# Patient Record
Sex: Male | Born: 1945 | Race: Black or African American | Hispanic: No | Smoking: Former smoker
Health system: Southern US, Community
[De-identification: ages and names within clinical notes are randomized; demographics above are authoritative.]

## PROBLEM LIST (undated history)

## (undated) DIAGNOSIS — M4802 Spinal stenosis, cervical region: Secondary | ICD-10-CM

## (undated) DIAGNOSIS — I10 Essential (primary) hypertension: Secondary | ICD-10-CM

## (undated) DIAGNOSIS — Z66 Do not resuscitate: Secondary | ICD-10-CM

## (undated) DIAGNOSIS — N1831 Chronic kidney disease, stage 3a: Secondary | ICD-10-CM

## (undated) DIAGNOSIS — E119 Type 2 diabetes mellitus without complications: Secondary | ICD-10-CM

## (undated) DIAGNOSIS — E785 Hyperlipidemia, unspecified: Secondary | ICD-10-CM

## (undated) HISTORY — PX: EYE SURGERY: SHX253

## (undated) HISTORY — PX: ANTERIOR CERVICAL DECOMP/DISCECTOMY FUSION: SHX1161

---

## 2015-08-30 ENCOUNTER — Emergency Department (HOSPITAL_BASED_OUTPATIENT_CLINIC_OR_DEPARTMENT_OTHER)
Admission: EM | Admit: 2015-08-30 | Discharge: 2015-08-30 | Disposition: A | Discharge status: Home / Self Care | Payer: Medicare Other | Attending: Emergency Medicine | Admitting: Emergency Medicine

## 2015-08-30 ENCOUNTER — Encounter (HOSPITAL_BASED_OUTPATIENT_CLINIC_OR_DEPARTMENT_OTHER): Payer: Self-pay | Admitting: *Deleted

## 2015-08-30 DIAGNOSIS — Z87891 Personal history of nicotine dependence: Secondary | ICD-10-CM | POA: Diagnosis not present

## 2015-08-30 DIAGNOSIS — R200 Anesthesia of skin: Secondary | ICD-10-CM

## 2015-08-30 DIAGNOSIS — Z79899 Other long term (current) drug therapy: Secondary | ICD-10-CM | POA: Diagnosis not present

## 2015-08-30 DIAGNOSIS — I10 Essential (primary) hypertension: Secondary | ICD-10-CM | POA: Insufficient documentation

## 2015-08-30 DIAGNOSIS — E119 Type 2 diabetes mellitus without complications: Secondary | ICD-10-CM | POA: Diagnosis not present

## 2015-08-30 DIAGNOSIS — R202 Paresthesia of skin: Secondary | ICD-10-CM | POA: Insufficient documentation

## 2015-08-30 DIAGNOSIS — E785 Hyperlipidemia, unspecified: Secondary | ICD-10-CM | POA: Insufficient documentation

## 2015-08-30 HISTORY — DX: Essential (primary) hypertension: I10

## 2015-08-30 HISTORY — DX: Type 2 diabetes mellitus without complications: E11.9

## 2015-08-30 HISTORY — DX: Hyperlipidemia, unspecified: E78.5

## 2015-08-30 MED ORDER — GABAPENTIN 300 MG PO CAPS: 300.0000 mg | ORAL_CAPSULE | Freq: Two times a day (BID) | ORAL | Status: DC

## 2015-08-30 MED FILL — GABAPENTIN 300 MG CAPSULE: 300 | 30 days supply | Qty: 60 | Fill #0

## 2015-08-30 NOTE — ED Notes (Signed)
MD at bedside. 

## 2015-08-30 NOTE — ED Provider Notes (Signed)
CSN: [id number]     Arrival date & time 08/30/15  1515 History   First MD Initiated Contact with Patient 08/30/15 1535     Chief Complaint  Patient presents with  . Numbness     (Consider location/radiation/quality/duration/timing/severity/associated sxs/prior Treatment) HPI Comments: 70yo M w/ HTN, HLD, T2DM who p/w numbness. The patient states that for for approximately 2 weeks, he has had numbness and tingling involving his hands, feet, and lower legs bilaterally. He presented to an outside hospital ER approximately 2 weeks ago, where workup included lab work, head CT, and MRI. He was told that his results were normal and discharged home. He followed up with his PCP last week and had more lab work drawn. He reports that his symptoms have not improved since they began. No headache, visual changes, extremity weakness, or unilateral symptoms. He denies any hx of neuropathy. He does note that he was told to stop taking his statin by his PCP and to drink lots of water. He follows with a nephrologist but has had good evaluations recently and does not have to follow-up for a year. He does report some leg cramps at night.  The history is provided by the patient.    Past Medical History  Diagnosis Date  . Hyperlipidemia   . Hypertension   . Diabetes mellitus without complication [facility])    Past Surgical History  Procedure Laterality Date  . Eye surgery     History reviewed. No pertinent family history. Social History  Substance Use Topics  . Smoking status: Former Games developer  . Smokeless tobacco: None  . Alcohol Use: No    Review of Systems 10 Systems reviewed and are negative for acute change except as noted in the HPI.    Allergies  Review of patient's allergies indicates no known allergies.  Home Medications   Prior to Admission medications   Medication Sig Start Date End Date Taking? Authorizing Provider  amLODipine (NORVASC) 10 MG tablet Take 10 mg by mouth daily.   Yes  Historical Provider, MD  atorvastatin (LIPITOR) 40 MG tablet Take 40 mg by mouth daily.   Yes Historical Provider, MD  baclofen (LIORESAL) 20 MG tablet Take 20 mg by mouth 3 (three) times daily.   Yes Historical Provider, MD  fenofibrate 160 MG tablet Take 130 mg by mouth daily.   Yes Historical Provider, MD  gabapentin (NEURONTIN) 100 MG capsule Take 100 mg by mouth 3 (three) times daily.   Yes Historical Provider, MD  labetalol (NORMODYNE) 200 MG tablet Take 200 mg by mouth 2 (two) times daily.   Yes Historical Provider, MD  losartan (COZAAR) 100 MG tablet Take 100 mg by mouth daily.   Yes Historical Provider, MD  montelukast (SINGULAIR) 10 MG tablet Take 10 mg by mouth at bedtime.   Yes Historical Provider, MD  tamsulosin (FLOMAX) 0.4 MG CAPS capsule Take 0.4 mg by mouth.   Yes Historical Provider, MD   BP 156/80 mmHg  Pulse 80  Temp(Src) 98 F (36.7 C) (Oral)  Resp 19  Ht 5\' 8"  (1.727 m)  Wt 180 lb (81.647 kg)  BMI 27.38 kg/m2  SpO2 98% Physical Exam  Constitutional: He is oriented to person, place, and time. He appears well-developed and well-nourished. No distress.  Awake, alert  HENT:  Head: Normocephalic and atraumatic.  Eyes: Conjunctivae and EOM are normal. Pupils are equal, round, and reactive to light.  Neck: Neck supple.  Cardiovascular: Normal rate, regular rhythm and normal heart sounds.  No murmur heard. Pulmonary/Chest: Effort normal and breath sounds normal. No respiratory distress.  Abdominal: Soft. Bowel sounds are normal. He exhibits no distension.  Musculoskeletal: He exhibits no edema.  Neurological: He is alert and oriented to person, place, and time. He has normal reflexes. No cranial nerve deficit. He exhibits normal muscle tone.  Fluent speech, normal finger-to-nose testing, negative pronator drift, normal rapid alt movements 5/5 strength  x all 4 extremities Subjective Mildly decreased sensation in b/l hands and BLE up to knees  Skin: Skin is warm and  dry.  Psychiatric: He has a normal mood and affect. Judgment and thought content normal.  Nursing note and vitals reviewed.   ED Course  Procedures (including critical care time) Labs Review Labs Reviewed - No data to display  Imaging Review No results found. I have personally reviewed and evaluated these lab results as part of my medical decision-making.   EKG Interpretation   Date/Time:  Wednesday August 30 2015 15:31:10 EDT Ventricular Rate:  81 PR Interval:    QRS Duration: 90 QT Interval:  339 QTC Calculation: 394 R Axis:   27 Text Interpretation:  Sinus rhythm Consider left atrial enlargement No  previous ECGs available Confirmed by Mystique Bjelland MD, Riely Oetken [phone]) on  08/30/2015 3:42:47 PM      MDM   Final diagnoses:  Bilateral numbness and tingling of arms and legs   Patient presents with 2 weeks of numbness involving both hands and both lower legs and feet, no other neurologic symptoms. He has been worked up at outpatient ER and with PCP but reports no improvement on his symptoms.  I contacted the patient's primary care office and discussed with Dr. Florina Ou  Nurse. Obtained results from ER and office work up which included normal MRI, Cr. 1.25, CK 690, HA1C 5.5. Normal iron and B12 studies.These labs are reassuring against poorly controlled DM, severe rhabdo, or stroke/mass as cause of his symptoms. Given that he has no weakness on exam and an otherwise reassuring neurologic exam, I feel he is safe for discharge without any further workup at this time. Per his clinic, he already has a f/u appt scheduled. I noticed that he is currently on gabapentin and he does report that he has been on this medication for a year for numbness in his feet, which suggests a chronic rather than acute problem. I did instruct him to increase his Neurontin dose to 300mg  BID and f/u w/ PCP to report if any improvement in symptoms. Patient voiced understanding of return precautions and was discharged in  satisfactory condition.  , MD 08/30/15 [phone]

## 2015-08-30 NOTE — Discharge Instructions (Signed)
Peripheral Neuropathy Peripheral neuropathy is a type of nerve damage. It affects nerves that carry signals between the spinal cord and other parts of the body. These are called peripheral nerves. With peripheral neuropathy, one nerve or a group of nerves may be damaged.  CAUSES  Many things can damage peripheral nerves. For some people with peripheral neuropathy, the cause is unknown. Some causes include:  Diabetes. This is the most common cause of peripheral neuropathy.  Injury to a nerve.  Pressure or stress on a nerve that lasts a long time.  Too Christifer Chapdelaine vitamin B. Alcoholism can lead to this.  Infections.  Autoimmune diseases, such as multiple sclerosis and systemic lupus erythematosus.  Inherited nerve diseases.  Some medicines, such as cancer drugs.  Toxic substances, such as lead and mercury.  Too Angelise Petrich blood flowing to the legs.  Kidney disease.  Thyroid disease. SIGNS AND SYMPTOMS  Different people have different symptoms. The symptoms you have will depend on which of your nerves is damaged. Common symptoms include:  Loss of feeling (numbness) in the feet and hands.  Tingling in the feet and hands.  Pain that burns.  Very sensitive skin.  Weakness.  Not being able to move a part of the body (paralysis).  Muscle twitching.  Clumsiness or poor coordination.  Loss of balance.  Not being able to control your bladder.  Feeling dizzy.  Sexual problems. DIAGNOSIS  Peripheral neuropathy is a symptom, not a disease. Finding the cause of peripheral neuropathy can be hard. To figure that out, your health care provider will take a medical history and do a physical exam. A neurological exam will also be done. This involves checking things affected by your brain, spinal cord, and nerves (nervous system). For example, your health care provider will check your reflexes, how you move, and what you can feel.  Other types of tests may also be ordered, such as:  Blood  tests.  A test of the fluid in your spinal cord.  Imaging tests, such as CT scans or an MRI.  Electromyography (EMG). This test checks the nerves that control muscles.  Nerve conduction velocity tests. These tests check how fast messages pass through your nerves.  Nerve biopsy. A small piece of nerve is removed. It is then checked under a microscope. TREATMENT   Medicine is often used to treat peripheral neuropathy. Medicines may include:  Pain-relieving medicines. Prescription or over-the-counter medicine may be suggested.  Antiseizure medicine. This may be used for pain.  Antidepressants. These also may help ease pain from neuropathy.  Lidocaine. This is a numbing medicine. You might wear a patch or be given a shot.  Mexiletine. This medicine is typically used to help control irregular heart rhythms.  Surgery. Surgery may be needed to relieve pressure on a nerve or to destroy a nerve that is causing pain.  Physical therapy to help movement.  Assistive devices to help movement. HOME CARE INSTRUCTIONS   Only take over-the-counter or prescription medicines as directed by your health care provider. Follow the instructions carefully for any given medicines. Do not take any other medicines without first getting approval from your health care provider.  If you have diabetes, work closely with your health care provider to keep your blood sugar under control.  If you have numbness in your feet:  Check every day for signs of injury or infection. Watch for redness, warmth, and swelling.  Wear padded socks and comfortable shoes. These help protect your feet.  Do not do   things that put pressure on your damaged nerve.  Do not smoke. Smoking keeps blood from getting to damaged nerves.  Avoid or limit alcohol. Too much alcohol can cause a lack of B vitamins. These vitamins are needed for healthy nerves.  Develop a good support system. Coping with peripheral neuropathy can be  stressful. Talk to a mental health specialist or join a support group if you are struggling.  Follow up with your health care provider as directed. SEEK MEDICAL CARE IF:   You have new signs or symptoms of peripheral neuropathy.  You are struggling emotionally from dealing with peripheral neuropathy.  You have a fever. SEEK IMMEDIATE MEDICAL CARE IF:   You have an injury or infection that is not healing.  You feel very dizzy or begin vomiting.  You have chest pain.  You have trouble breathing.   This information is not intended to replace advice given to you by your health care provider. Make sure you discuss any questions you have with your health care provider.   Document Released: 01/25/2002 Document Revised: 10/17/2010 Document Reviewed: 10/12/2012 Elsevier Interactive Patient Education 2016 [employer]  

## 2015-08-30 NOTE — ED Notes (Signed)
Pt c/o extr numbness x 2 weeks , seen by Shawn Decker Ed for same x 2 weeks ago , also f/u with PMD, cholesteryl meds d/c

## 2015-09-06 ENCOUNTER — Encounter (HOSPITAL_BASED_OUTPATIENT_CLINIC_OR_DEPARTMENT_OTHER): Payer: Self-pay | Admitting: Emergency Medicine

## 2015-09-06 ENCOUNTER — Emergency Department (HOSPITAL_BASED_OUTPATIENT_CLINIC_OR_DEPARTMENT_OTHER)
Admission: EM | Admit: 2015-09-06 | Discharge: 2015-09-06 | Disposition: A | Discharge status: Home / Self Care | Payer: Medicare Other | Attending: Emergency Medicine | Admitting: Emergency Medicine

## 2015-09-06 DIAGNOSIS — I10 Essential (primary) hypertension: Secondary | ICD-10-CM | POA: Insufficient documentation

## 2015-09-06 DIAGNOSIS — G629 Polyneuropathy, unspecified: Secondary | ICD-10-CM

## 2015-09-06 DIAGNOSIS — E785 Hyperlipidemia, unspecified: Secondary | ICD-10-CM | POA: Insufficient documentation

## 2015-09-06 DIAGNOSIS — Z87891 Personal history of nicotine dependence: Secondary | ICD-10-CM | POA: Insufficient documentation

## 2015-09-06 DIAGNOSIS — Z79899 Other long term (current) drug therapy: Secondary | ICD-10-CM | POA: Insufficient documentation

## 2015-09-06 DIAGNOSIS — E1140 Type 2 diabetes mellitus with diabetic neuropathy, unspecified: Secondary | ICD-10-CM | POA: Insufficient documentation

## 2015-09-06 NOTE — Discharge Instructions (Signed)
As we discussed recommend follow-up with neurology but sounds like a primary care doctor made arrangements for that today. Since MRI was already done 3 weeks ago while the symptoms were ongoing no reason to do additional studies. Also primary care doctor has done extensive lab workup trying to figure out what is the cause of your symptoms.   Do recommend returning for any significant new or totally different symptoms.

## 2015-09-06 NOTE — ED Notes (Signed)
Pt with increasing numbness and tingling in the upper and lower extremities, ongoing for two weeks. Recently seen here for the same states he is waiting an appoint with Neurologist. A/O NAD at triage.

## 2015-09-06 NOTE — ED Provider Notes (Signed)
CSN: [id number]     Arrival date & time 09/06/15  1335 History   First MD Initiated Contact with Patient 09/06/15 1424     Chief Complaint  Patient presents with  . Peripheral Neuropathy     (Consider location/radiation/quality/duration/timing/severity/associated sxs/prior Treatment) The history is provided by the patient, the spouse and a relative.  70 year old male with history significant for hypertension and type 2 diabetes. Patient's been having trouble with the numbness involving hands feet and lower legs bilaterally. Patient was evaluated 3 weeks ago [facility] emergency department which included the head CT and MRI. Patient's also been seen by his primary care doctor for this with extensive lab workup. Patient also seen in the emergency department for this on July 12. Neurontin which she has been on was increased during that visit. Patient states not getting any better numbness staying the same. But now starting to have a little bit of coordination problem with his hands. Little bit of tremor.  No visual changes no headaches no fevers no significantly new or different symptoms just a little bit of progression within a slightly new is a little bit of the bilateral arm weakness.  Past Medical History  Diagnosis Date  . Hyperlipidemia   . Hypertension   . Diabetes mellitus without complication [facility])    Past Surgical History  Procedure Laterality Date  . Eye surgery     No family history on file. Social History  Substance Use Topics  . Smoking status: Former Games developer  . Smokeless tobacco: None  . Alcohol Use: No    Review of Systems  Constitutional: Negative for fever.  HENT: Negative for congestion.   Eyes: Negative for visual disturbance.  Respiratory: Negative for shortness of breath.   Cardiovascular: Negative for chest pain.  Gastrointestinal: Negative for abdominal pain.  Genitourinary: Negative for dysuria.  Skin: Negative for rash.  Neurological: Positive for  tremors, weakness and numbness. Negative for dizziness, seizures, syncope, facial asymmetry, speech difficulty and headaches.      Allergies  Review of patient's allergies indicates no known allergies.  Home Medications   Prior to Admission medications   Medication Sig Start Date End Date Taking? Authorizing Provider  amLODipine (NORVASC) 10 MG tablet Take 10 mg by mouth daily.   Yes Historical Provider, MD  baclofen (LIORESAL) 20 MG tablet Take 20 mg by mouth 3 (three) times daily.   Yes Historical Provider, MD  fenofibrate 160 MG tablet Take 130 mg by mouth daily.   Yes Historical Provider, MD  gabapentin (NEURONTIN) 300 MG capsule Take 1 capsule (300 mg total) by mouth 2 (two) times daily. 08/30/15  Yes Ambrose Finland Little, MD  labetalol (NORMODYNE) 200 MG tablet Take 200 mg by mouth 2 (two) times daily.   Yes Historical Provider, MD  losartan (COZAAR) 100 MG tablet Take 100 mg by mouth daily.   Yes Historical Provider, MD  montelukast (SINGULAIR) 10 MG tablet Take 10 mg by mouth at bedtime.   Yes Historical Provider, MD  tamsulosin (FLOMAX) 0.4 MG CAPS capsule Take 0.4 mg by mouth.   Yes Historical Provider, MD  atorvastatin (LIPITOR) 40 MG tablet Take 40 mg by mouth daily.    Historical Provider, MD   BP 133/70 mmHg  Pulse 80  Temp(Src) 98.1 F (36.7 C) (Oral)  Resp 16  Ht 5\' 8"  (1.727 m)  Wt 81.194 kg  BMI 27.22 kg/m2  SpO2 99% Physical Exam  Constitutional: He is oriented to person, place, and time. He appears well-developed and  well-nourished. No distress.  HENT:  Head: Normocephalic and atraumatic.  Mouth/Throat: Oropharynx is clear and moist.  Eyes: Conjunctivae and EOM are normal. Pupils are equal, round, and reactive to light.  Neck: Normal range of motion. Neck supple.  Cardiovascular: Normal rate, regular rhythm and normal heart sounds.   No murmur heard. Pulmonary/Chest: Effort normal and breath sounds normal. No respiratory distress.  Abdominal: Soft. Bowel  sounds are normal. There is no tenderness.  Musculoskeletal: Normal range of motion. He exhibits no edema.  Neurological: He is alert and oriented to person, place, and time. No cranial nerve deficit. He exhibits normal muscle tone. Coordination abnormal.  Patient without any significant lower extremity weakness. Does have some very slight bilateral upper extremity weakness a little bit of coordination problems with his fingers. No visual changes or any cranial nerve changes.  Skin: Skin is warm. No rash noted.  Nursing note and vitals reviewed.   ED Course  Procedures (including critical care time) Labs Review Labs Reviewed - No data to display  Imaging Review No results found. I have personally reviewed and evaluated these images and lab results as part of my medical decision-making.   EKG Interpretation None      MDM   Final diagnoses:  Neuropathy [facility])    Patient has had extensive workup for the symptoms by his primary care doctor and also in an emergency department visit in [place]. Where he had a normal MRI. Patient's primary care doctors Dr. Madilyn Fireman. Patient has follow-up with him on Friday. Dr. Madilyn Fireman is arrange neurology follow-up. Patient's been on Neurontin. At last ED visit on July 12 Neurontin was increased but doesn't seem to be making any difference. His numbness and tingling involving his hands feet and lower legs bilaterally has persisted but now he is noticing some bilateral motor symptoms in his upper extremities mostly in his hands. This is new since the last visit.  As discussed with patient extensive workup has been done. Do not see any reason for repeating labs here today or doing a head CT since MRI was normal. However follow-up with neurology would be important. Symptoms sound suggestive of perhaps some sort a neurotransmitter abnormalities like early Parkinson's or early form of Lewy bodies dementia.  Patient stable for discharge home. He does have follow-up  with his primary care doctor on Friday.  Patient without any new or significantly worse focal findings.    Vanetta Mulders, MD 09/06/15 [phone]

## 2017-09-21 ENCOUNTER — Emergency Department (HOSPITAL_BASED_OUTPATIENT_CLINIC_OR_DEPARTMENT_OTHER): Payer: Medicare HMO

## 2017-09-21 ENCOUNTER — Encounter (HOSPITAL_BASED_OUTPATIENT_CLINIC_OR_DEPARTMENT_OTHER): Payer: Self-pay | Admitting: Emergency Medicine

## 2017-09-21 ENCOUNTER — Other Ambulatory Visit: Payer: Self-pay

## 2017-09-21 ENCOUNTER — Emergency Department (HOSPITAL_BASED_OUTPATIENT_CLINIC_OR_DEPARTMENT_OTHER)
Admission: EM | Admit: 2017-09-21 | Discharge: 2017-09-21 | Disposition: A | Discharge status: Home / Self Care | Payer: Medicare HMO | Attending: Emergency Medicine | Admitting: Emergency Medicine

## 2017-09-21 DIAGNOSIS — I10 Essential (primary) hypertension: Secondary | ICD-10-CM | POA: Diagnosis not present

## 2017-09-21 DIAGNOSIS — Z87891 Personal history of nicotine dependence: Secondary | ICD-10-CM | POA: Diagnosis not present

## 2017-09-21 DIAGNOSIS — R52 Pain, unspecified: Secondary | ICD-10-CM

## 2017-09-21 DIAGNOSIS — E119 Type 2 diabetes mellitus without complications: Secondary | ICD-10-CM | POA: Insufficient documentation

## 2017-09-21 DIAGNOSIS — Z79899 Other long term (current) drug therapy: Secondary | ICD-10-CM | POA: Diagnosis not present

## 2017-09-21 DIAGNOSIS — M542 Cervicalgia: Secondary | ICD-10-CM | POA: Diagnosis present

## 2017-09-21 MED ORDER — HYDROCODONE-ACETAMINOPHEN 5-325 MG PO TABS: 1.0000 | ORAL_TABLET | Freq: Four times a day (QID) | ORAL | 0 refills | Status: DC | PRN

## 2017-09-21 MED ORDER — IBUPROFEN 400 MG PO TABS: 400.0000 mg | ORAL_TABLET | Freq: Four times a day (QID) | ORAL | 0 refills | Status: DC | PRN

## 2017-09-21 MED ORDER — IBUPROFEN 400 MG PO TABS
400.0000 mg | ORAL_TABLET | Freq: Once | ORAL | Status: AC
  Administered 2017-09-21: 400 mg via ORAL
  Filled 2017-09-21: qty 1

## 2017-09-21 NOTE — ED Notes (Signed)
Patient denies any pain this time.  Resting comfortably.
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
## 2017-09-21 NOTE — ED Notes (Signed)
ED Provider at bedside. 
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
## 2021-08-03 ENCOUNTER — Encounter (HOSPITAL_BASED_OUTPATIENT_CLINIC_OR_DEPARTMENT_OTHER): Payer: Self-pay | Admitting: Urology

## 2021-08-03 ENCOUNTER — Other Ambulatory Visit: Payer: Self-pay

## 2021-08-03 ENCOUNTER — Inpatient Hospital Stay (HOSPITAL_BASED_OUTPATIENT_CLINIC_OR_DEPARTMENT_OTHER)
Admission: EM | Admit: 2021-08-03 | Discharge: 2021-08-09 | DRG: 454 | Disposition: A | Discharge status: Rehabilitation Facility | Payer: Medicare HMO | Attending: Internal Medicine | Admitting: Internal Medicine

## 2021-08-03 DIAGNOSIS — M4802 Spinal stenosis, cervical region: Secondary | ICD-10-CM | POA: Diagnosis present

## 2021-08-03 DIAGNOSIS — Z981 Arthrodesis status: Secondary | ICD-10-CM

## 2021-08-03 DIAGNOSIS — M4712 Other spondylosis with myelopathy, cervical region: Secondary | ICD-10-CM | POA: Diagnosis not present

## 2021-08-03 DIAGNOSIS — I10 Essential (primary) hypertension: Secondary | ICD-10-CM

## 2021-08-03 DIAGNOSIS — E119 Type 2 diabetes mellitus without complications: Secondary | ICD-10-CM

## 2021-08-03 DIAGNOSIS — I129 Hypertensive chronic kidney disease with stage 1 through stage 4 chronic kidney disease, or unspecified chronic kidney disease: Secondary | ICD-10-CM | POA: Diagnosis present

## 2021-08-03 DIAGNOSIS — Z79899 Other long term (current) drug therapy: Secondary | ICD-10-CM

## 2021-08-03 DIAGNOSIS — E1142 Type 2 diabetes mellitus with diabetic polyneuropathy: Secondary | ICD-10-CM | POA: Diagnosis present

## 2021-08-03 DIAGNOSIS — N1831 Chronic kidney disease, stage 3a: Secondary | ICD-10-CM

## 2021-08-03 DIAGNOSIS — N179 Acute kidney failure, unspecified: Secondary | ICD-10-CM | POA: Diagnosis not present

## 2021-08-03 DIAGNOSIS — Z66 Do not resuscitate: Secondary | ICD-10-CM

## 2021-08-03 DIAGNOSIS — E1122 Type 2 diabetes mellitus with diabetic chronic kidney disease: Secondary | ICD-10-CM | POA: Diagnosis present

## 2021-08-03 DIAGNOSIS — E785 Hyperlipidemia, unspecified: Secondary | ICD-10-CM

## 2021-08-03 DIAGNOSIS — M5412 Radiculopathy, cervical region: Secondary | ICD-10-CM | POA: Diagnosis present

## 2021-08-03 DIAGNOSIS — Z87891 Personal history of nicotine dependence: Secondary | ICD-10-CM

## 2021-08-03 DIAGNOSIS — M6281 Muscle weakness (generalized): Principal | ICD-10-CM

## 2021-08-03 HISTORY — DX: Chronic kidney disease, stage 3a: N18.31

## 2021-08-03 HISTORY — DX: Spinal stenosis, cervical region: M48.02

## 2021-08-03 HISTORY — DX: Do not resuscitate: Z66

## 2021-08-03 LAB — BASIC METABOLIC PANEL
Anion gap: 6 (ref 5–15)
BUN: 22 mg/dL (ref 8–23)
CO2: 24 mmol/L (ref 22–32)
Calcium: 9.1 mg/dL (ref 8.9–10.3)
Chloride: 108 mmol/L (ref 98–111)
Creatinine, Ser: 1.59 mg/dL — ABNORMAL HIGH (ref 0.61–1.24)
GFR, Estimated: 45 mL/min — ABNORMAL LOW (ref >60)
Glucose, Bld: 104 mg/dL — ABNORMAL HIGH (ref 70–99)
Potassium: 3.8 mmol/L (ref 3.5–5.1)
Sodium: 138 mmol/L (ref 135–145)

## 2021-08-03 LAB — MAGNESIUM: Magnesium: 1.4 mg/dL — ABNORMAL LOW (ref 1.7–2.4)

## 2021-08-03 LAB — CBC WITH DIFFERENTIAL/PLATELET
Abs Immature Granulocytes: 0.01 10*3/uL (ref 0.00–0.07)
Basophils Absolute: 0.1 10*3/uL (ref 0.0–0.1)
Basophils Relative: 1 %
Eosinophils Absolute: 0.2 10*3/uL (ref 0.0–0.5)
Eosinophils Relative: 3 %
HCT: 33.3 % — ABNORMAL LOW (ref 39.0–52.0)
Hemoglobin: 10.9 g/dL — ABNORMAL LOW (ref 13.0–17.0)
Immature Granulocytes: 0 %
Lymphocytes Relative: 22 %
Lymphs Abs: 1.3 10*3/uL (ref 0.7–4.0)
MCH: 28 pg (ref 26.0–34.0)
MCHC: 32.7 g/dL (ref 30.0–36.0)
MCV: 85.6 fL (ref 80.0–100.0)
Monocytes Absolute: 0.8 10*3/uL (ref 0.1–1.0)
Monocytes Relative: 13 %
Neutro Abs: 3.7 10*3/uL (ref 1.7–7.7)
Neutrophils Relative %: 61 %
Platelets: 234 10*3/uL (ref 150–400)
RBC: 3.89 MIL/uL — ABNORMAL LOW (ref 4.22–5.81)
RDW: 15 % (ref 11.5–15.5)
WBC: 6 10*3/uL (ref 4.0–10.5)
nRBC: 0 % (ref 0.0–0.2)

## 2021-08-03 NOTE — ED Notes (Signed)
Patient requesting home meds of flomax and labetalol as well as something to eat.  MD Belfi made aware via secure chat.

## 2021-08-03 NOTE — ED Provider Notes (Signed)
[facility] EMERGENCY DEPARTMENT Provider Note   CSN: [id number] Arrival date & time: 08/03/21  1322     History  Chief Complaint  Patient presents with   Gait Problem    Shawn Decker is a 76 y.o. male.  Patient is a 76 year old male who presents with weakness.  He has a history of hypertension, hyperlipidemia and diabetes.  He states about 2 weeks ago he started having some increased weakness.  It started with some increased numbness in his hands and feet.  He does have a history of neuropathy and it was felt that that was just getting worse.  He then noticed that he was having some progressive weakness of his lower extremities.  He normally walks independently but has had to use a walker and now he has trouble walking even with a walker.  He says his legs feel much weaker than they have been in the past.  He normally does not have weakness in his legs.  He also feels like his arms are weaker.  He normally lifts some over his head without any difficulty now he is having a hard time lifting them up past the shoulders.  He has some increased tingling and numbness in his hands and feet.  He also feels like his bottom has recently been numb.  When he sits down on the toilet, he says he cannot feel it like he normally does.  He does not have any loss of bowel or bladder control.  No headaches.  No vision changes.  No speech deficits. No neck or back pain.       Home Medications Prior to Admission medications   Medication Sig Start Date End Date Taking? Authorizing Provider  amLODipine (NORVASC) 10 MG tablet Take 10 mg by mouth daily.    [provider]  atorvastatin (LIPITOR) 40 MG tablet Take 40 mg by mouth daily.    [provider]  baclofen (LIORESAL) 20 MG tablet Take 20 mg by mouth 3 (three) times daily.    [provider]  fenofibrate 160 MG tablet Take 130 mg by mouth daily.    [provider]  gabapentin (NEURONTIN) 300 MG capsule Take 1  capsule (300 mg total) by mouth 2 (two) times daily. 08/30/15   Little, Ambrose Finland, MD  HYDROcodone-acetaminophen (NORCO/VICODIN) 5-325 MG tablet Take 1 tablet by mouth every 6 (six) hours as needed. 09/21/17   Law, Waylan Boga, PA-C  ibuprofen (ADVIL,MOTRIN) 400 MG tablet Take 1 tablet (400 mg total) by mouth every 6 (six) hours as needed for mild pain or moderate pain. 09/21/17   Law, Waylan Boga, PA-C  labetalol (NORMODYNE) 200 MG tablet Take 200 mg by mouth 2 (two) times daily.    [provider]  losartan (COZAAR) 100 MG tablet Take 100 mg by mouth daily.    [provider]  montelukast (SINGULAIR) 10 MG tablet Take 10 mg by mouth at bedtime.    [provider]  tamsulosin (FLOMAX) 0.4 MG CAPS capsule Take 0.4 mg by mouth.    [provider]      Allergies    Patient has no known allergies.    Review of Systems   Review of Systems  Constitutional:  Negative for chills, diaphoresis, fatigue and fever.  HENT:  Negative for congestion, rhinorrhea and sneezing.   Eyes: Negative.   Respiratory:  Negative for cough, chest tightness and shortness of breath.   Cardiovascular:  Negative for chest pain and leg swelling.  Gastrointestinal:  Negative for abdominal pain, blood in stool, diarrhea, nausea and vomiting.  Genitourinary:  Negative for difficulty urinating, flank pain, frequency and hematuria.  Musculoskeletal:  Negative for arthralgias and back pain.  Skin:  Negative for rash.  Neurological:  Positive for weakness and numbness. Negative for dizziness, speech difficulty and headaches.    Physical Exam Updated Vital Signs BP (!) 160/73   Pulse 70   Temp 98.3 F (36.8 C) (Oral)   Resp 18   Ht  (1.727 m)   Wt 81.6 kg   SpO2 98%   BMI 27.35 kg/m  Physical Exam Constitutional:      Appearance: He is well-developed.  HENT:     Head: Normocephalic and atraumatic.  Eyes:     Pupils: Pupils are equal, round, and reactive to light.   Cardiovascular:     Rate and Rhythm: Normal rate and regular rhythm.     Heart sounds: Normal heart sounds.  Pulmonary:     Effort: Pulmonary effort is normal. No respiratory distress.     Breath sounds: Normal breath sounds. No wheezing or rales.  Chest:     Chest wall: No tenderness.  Abdominal:     General: Bowel sounds are normal.     Palpations: Abdomen is soft.     Tenderness: There is no abdominal tenderness. There is no guarding or rebound.  Musculoskeletal:        General: Normal range of motion.     Cervical back: Normal range of motion and neck supple.  Lymphadenopathy:     Cervical: No cervical adenopathy.  Skin:    General: Skin is warm and dry.     Findings: No rash.  Neurological:     Mental Status: He is alert and oriented to person, place, and time.     Comments: Patient has 4 out of 5 strength in the upper extremities bilaterally.  He does have normal sensation to light touch in the extremities bilaterally.  He has 2 out of 5 strength in the lower extremities bilaterally.  He has normal sensation to both extremities bilaterally.  Cranial nerves II through XII grossly intact.     ED Results / Procedures / Treatments   Labs (all labs ordered are listed, but only abnormal results are displayed) Labs Reviewed  BASIC METABOLIC PANEL - Abnormal; Notable for the following components:      Result Value   Glucose, Bld 104 (*)    Creatinine, Ser 1.59 (*)    GFR, Estimated 45 (*)    All other components within normal limits  CBC WITH DIFFERENTIAL/PLATELET - Abnormal; Notable for the following components:   RBC 3.89 (*)    Hemoglobin 10.9 (*)    HCT 33.3 (*)    All other components within normal limits  MAGNESIUM - Abnormal; Notable for the following components:   Magnesium 1.4 (*)    All other components within normal limits    EKG EKG Interpretation  Date/Time:  Friday August 03 2021 13:34:59 EDT Ventricular Rate:  81 PR Interval:  166 QRS Duration: 80 QT  Interval:  358 QTC Calculation: 415 R Axis:   35 Text Interpretation: Sinus rhythm with occasional Premature ventricular complexes Otherwise normal ECG When compared with ECG of 30-Aug-2015 15:31, No significant change since last tracing Confirmed by Meridee Score [phone]) on 08/03/2021 1:42:48 PM  Radiology No results found.  Procedures Procedures    Medications Ordered in ED Medications - No data to display  ED Course/  Medical Decision Making/ A&P                           Medical Decision Making Amount and/or Complexity of Data Reviewed Labs: ordered. Radiology: ordered.   Patient presents with progressive numbness and weakness to his extremities over the last 2 weeks.  He was previously ambulatory unassisted and now he is having a hard time walking with a walker.  His labs show a mild anemia.  On chart review it slightly lower than his prior values but fairly similar.  His creatinine is elevated but just slightly more elevated than his last values.  His magnesium level is slightly low.  He has been receiving magnesium supplements and doubt that this would be contributing to his symptoms.  I am concerned about possible central cord syndrome.  He also seems to have some numbness in his perineum.  He does not have other symptoms that sound more concerning for cauda equina.  However I feel that he needs some MRIs and given these multitude of symptoms we will go ahead and MRI the whole spine and his brain.  We will need to transfer him to [facility] for these test.  He does not seem to be appropriate for POV given that he is having a hard time standing and walking unassisted at this point.  We are waiting for [facility] truck to transfer.  Patient has been accepted by Dr. Rush Landmark.  Final Clinical Impression(s) / ED Diagnoses Final diagnoses:  Muscle weakness of extremity    Rx / DC Orders ED Discharge Orders     None         Rolan Bucco, MD 08/03/21 1704

## 2021-08-03 NOTE — ED Triage Notes (Signed)
Pt states unsteady balance x 2 weeks and numbness and tingling to fingers and toes  States also having SOB with exertion Denies any dizziness or pain  A&O x 4 VAN Neg, BEFAST Neg

## 2021-08-04 ENCOUNTER — Inpatient Hospital Stay (HOSPITAL_COMMUNITY): Payer: Medicare HMO

## 2021-08-04 ENCOUNTER — Inpatient Hospital Stay (HOSPITAL_COMMUNITY): Payer: Medicare HMO | Admitting: Certified Registered Nurse Anesthetist

## 2021-08-04 ENCOUNTER — Emergency Department (HOSPITAL_COMMUNITY): Payer: Medicare HMO

## 2021-08-04 ENCOUNTER — Encounter (HOSPITAL_COMMUNITY): Payer: Self-pay | Admitting: Emergency Medicine

## 2021-08-04 ENCOUNTER — Other Ambulatory Visit: Payer: Self-pay

## 2021-08-04 ENCOUNTER — Inpatient Hospital Stay (HOSPITAL_COMMUNITY)
Admission: EM | Disposition: A | Discharge status: Rehabilitation Facility | Payer: Self-pay | Source: Home / Self Care | Attending: Internal Medicine

## 2021-08-04 DIAGNOSIS — N1831 Chronic kidney disease, stage 3a: Secondary | ICD-10-CM

## 2021-08-04 DIAGNOSIS — Z66 Do not resuscitate: Secondary | ICD-10-CM

## 2021-08-04 DIAGNOSIS — Z79899 Other long term (current) drug therapy: Secondary | ICD-10-CM | POA: Diagnosis not present

## 2021-08-04 DIAGNOSIS — R1312 Dysphagia, oropharyngeal phase: Secondary | ICD-10-CM | POA: Diagnosis not present

## 2021-08-04 DIAGNOSIS — R7989 Other specified abnormal findings of blood chemistry: Secondary | ICD-10-CM | POA: Diagnosis not present

## 2021-08-04 DIAGNOSIS — M4802 Spinal stenosis, cervical region: Secondary | ICD-10-CM

## 2021-08-04 DIAGNOSIS — I129 Hypertensive chronic kidney disease with stage 1 through stage 4 chronic kidney disease, or unspecified chronic kidney disease: Secondary | ICD-10-CM | POA: Diagnosis present

## 2021-08-04 DIAGNOSIS — Z87891 Personal history of nicotine dependence: Secondary | ICD-10-CM | POA: Diagnosis not present

## 2021-08-04 DIAGNOSIS — Z981 Arthrodesis status: Secondary | ICD-10-CM | POA: Diagnosis not present

## 2021-08-04 DIAGNOSIS — E1142 Type 2 diabetes mellitus with diabetic polyneuropathy: Secondary | ICD-10-CM | POA: Diagnosis present

## 2021-08-04 DIAGNOSIS — D62 Acute posthemorrhagic anemia: Secondary | ICD-10-CM | POA: Diagnosis not present

## 2021-08-04 DIAGNOSIS — G959 Disease of spinal cord, unspecified: Secondary | ICD-10-CM | POA: Diagnosis not present

## 2021-08-04 DIAGNOSIS — M4712 Other spondylosis with myelopathy, cervical region: Secondary | ICD-10-CM | POA: Diagnosis present

## 2021-08-04 DIAGNOSIS — E119 Type 2 diabetes mellitus without complications: Secondary | ICD-10-CM

## 2021-08-04 DIAGNOSIS — E1122 Type 2 diabetes mellitus with diabetic chronic kidney disease: Secondary | ICD-10-CM | POA: Diagnosis present

## 2021-08-04 DIAGNOSIS — N179 Acute kidney failure, unspecified: Secondary | ICD-10-CM | POA: Diagnosis not present

## 2021-08-04 DIAGNOSIS — E785 Hyperlipidemia, unspecified: Secondary | ICD-10-CM | POA: Diagnosis present

## 2021-08-04 DIAGNOSIS — M5412 Radiculopathy, cervical region: Secondary | ICD-10-CM | POA: Diagnosis present

## 2021-08-04 DIAGNOSIS — I10 Essential (primary) hypertension: Secondary | ICD-10-CM

## 2021-08-04 HISTORY — PX: POSTERIOR CERVICAL FUSION/FORAMINOTOMY: SHX5038

## 2021-08-04 LAB — TYPE AND SCREEN
ABO/RH(D): O POS
Antibody Screen: NEGATIVE

## 2021-08-04 LAB — ABO/RH: ABO/RH(D): O POS

## 2021-08-04 LAB — SURGICAL PCR SCREEN
MRSA, PCR: NEGATIVE
Staphylococcus aureus: NEGATIVE

## 2021-08-04 LAB — GLUCOSE, CAPILLARY: Glucose-Capillary: 148 mg/dL — ABNORMAL HIGH (ref 70–99)

## 2021-08-04 SURGERY — POSTERIOR CERVICAL FUSION/FORAMINOTOMY LEVEL 2
Anesthesia: General | Site: Neck

## 2021-08-04 MED ORDER — DOCUSATE SODIUM 100 MG PO CAPS
100.0000 mg | ORAL_CAPSULE | Freq: Two times a day (BID) | ORAL | Status: DC
  Administered 2021-08-04 – 2021-08-08 (×6): 100 mg via ORAL
  Filled 2021-08-04 (×9): qty 1

## 2021-08-04 MED ORDER — DEXAMETHASONE SODIUM PHOSPHATE 10 MG/ML IJ SOLN
10.0000 mg | Freq: Once | INTRAMUSCULAR | Status: AC
  Administered 2021-08-04: 10 mg via INTRAVENOUS
  Filled 2021-08-04: qty 1

## 2021-08-04 MED ORDER — ONDANSETRON HCL 4 MG/2ML IJ SOLN
INTRAMUSCULAR | Status: DC | PRN
  Administered 2021-08-04: 4 mg via INTRAVENOUS

## 2021-08-04 MED ORDER — MUPIROCIN 2 % EX OINT
1.0000 | TOPICAL_OINTMENT | Freq: Two times a day (BID) | CUTANEOUS | Status: AC
  Administered 2021-08-06 – 2021-08-08 (×6): 1 via NASAL
  Filled 2021-08-04: qty 22

## 2021-08-04 MED ORDER — ONDANSETRON HCL 4 MG/2ML IJ SOLN: 4.0000 mg | Freq: Four times a day (QID) | INTRAMUSCULAR | Status: DC | PRN

## 2021-08-04 MED ORDER — PHENYLEPHRINE 80 MCG/ML (10ML) SYRINGE FOR IV PUSH (FOR BLOOD PRESSURE SUPPORT)
PREFILLED_SYRINGE | INTRAVENOUS | Status: AC
  Filled 2021-08-04: qty 10

## 2021-08-04 MED ORDER — FENTANYL CITRATE (PF) 250 MCG/5ML IJ SOLN
INTRAMUSCULAR | Status: DC | PRN
  Administered 2021-08-04: 100 ug via INTRAVENOUS
  Administered 2021-08-04 (×2): 50 ug via INTRAVENOUS

## 2021-08-04 MED ORDER — ACETAMINOPHEN 650 MG RE SUPP: 650.0000 mg | RECTAL | Status: DC | PRN

## 2021-08-04 MED ORDER — MENTHOL 3 MG MT LOZG: 1.0000 | LOZENGE | OROMUCOSAL | Status: DC | PRN

## 2021-08-04 MED ORDER — CEFAZOLIN SODIUM-DEXTROSE 2-4 GM/100ML-% IV SOLN
2.0000 g | Freq: Three times a day (TID) | INTRAVENOUS | Status: AC
  Administered 2021-08-04 – 2021-08-05 (×2): 2 g via INTRAVENOUS
  Filled 2021-08-04 (×2): qty 100

## 2021-08-04 MED ORDER — ACETAMINOPHEN 160 MG/5ML PO SOLN: 1000.0000 mg | Freq: Once | ORAL | Status: DC | PRN

## 2021-08-04 MED ORDER — FLEET ENEMA 7-19 GM/118ML RE ENEM: 1.0000 | ENEMA | Freq: Once | RECTAL | Status: DC | PRN

## 2021-08-04 MED ORDER — ACETAMINOPHEN 10 MG/ML IV SOLN
INTRAVENOUS | Status: AC
  Filled 2021-08-04: qty 100

## 2021-08-04 MED ORDER — AMILORIDE HCL 5 MG PO TABS
5.0000 mg | ORAL_TABLET | ORAL | Status: DC
  Administered 2021-08-06 – 2021-08-08 (×2): 5 mg via ORAL
  Filled 2021-08-04 (×2): qty 1

## 2021-08-04 MED ORDER — THROMBIN 5000 UNITS EX SOLR
OROMUCOSAL | Status: DC | PRN
  Administered 2021-08-04 (×2): 5 mL via TOPICAL

## 2021-08-04 MED ORDER — PHENOL 1.4 % MT LIQD: 1.0000 | OROMUCOSAL | Status: DC | PRN

## 2021-08-04 MED ORDER — MAGNESIUM OXIDE -MG SUPPLEMENT 400 (240 MG) MG PO TABS
400.0000 mg | ORAL_TABLET | Freq: Every day | ORAL | Status: DC
  Administered 2021-08-04 – 2021-08-09 (×6): 400 mg via ORAL
  Filled 2021-08-04 (×6): qty 1

## 2021-08-04 MED ORDER — INSULIN ASPART 100 UNIT/ML IJ SOLN: 0.0000 [IU] | INTRAMUSCULAR | Status: DC | PRN

## 2021-08-04 MED ORDER — POLYETHYLENE GLYCOL 3350 17 G PO PACK: 17.0000 g | PACK | Freq: Every day | ORAL | Status: DC | PRN

## 2021-08-04 MED ORDER — OXYCODONE HCL 5 MG PO TABS: 5.0000 mg | ORAL_TABLET | Freq: Once | ORAL | Status: DC | PRN

## 2021-08-04 MED ORDER — ACETAMINOPHEN 500 MG PO TABS: 1000.0000 mg | ORAL_TABLET | Freq: Once | ORAL | Status: DC | PRN

## 2021-08-04 MED ORDER — LIDOCAINE-EPINEPHRINE 1 %-1:100000 IJ SOLN
INTRAMUSCULAR | Status: AC
  Filled 2021-08-04: qty 1

## 2021-08-04 MED ORDER — CEFAZOLIN SODIUM-DEXTROSE 2-4 GM/100ML-% IV SOLN
INTRAVENOUS | Status: AC
  Filled 2021-08-04: qty 100

## 2021-08-04 MED ORDER — FENTANYL CITRATE (PF) 250 MCG/5ML IJ SOLN
INTRAMUSCULAR | Status: AC
  Filled 2021-08-04: qty 5

## 2021-08-04 MED ORDER — THROMBIN 5000 UNITS EX SOLR
CUTANEOUS | Status: AC
  Filled 2021-08-04: qty 5000

## 2021-08-04 MED ORDER — THROMBIN 5000 UNITS EX SOLR
CUTANEOUS | Status: AC
  Filled 2021-08-04: qty 10000

## 2021-08-04 MED ORDER — DEXAMETHASONE SODIUM PHOSPHATE 10 MG/ML IJ SOLN
INTRAMUSCULAR | Status: AC
  Filled 2021-08-04: qty 1

## 2021-08-04 MED ORDER — ALUM & MAG HYDROXIDE-SIMETH 200-200-20 MG/5ML PO SUSP: 30.0000 mL | Freq: Four times a day (QID) | ORAL | Status: DC | PRN

## 2021-08-04 MED ORDER — BACITRACIN ZINC 500 UNIT/GM EX OINT
TOPICAL_OINTMENT | CUTANEOUS | Status: DC | PRN
  Administered 2021-08-04: 1 via TOPICAL

## 2021-08-04 MED ORDER — MORPHINE SULFATE (PF) 2 MG/ML IV SOLN: 2.0000 mg | INTRAVENOUS | Status: DC | PRN

## 2021-08-04 MED ORDER — ACETAMINOPHEN 650 MG RE SUPP
650.0000 mg | Freq: Four times a day (QID) | RECTAL | Status: DC | PRN
Start: 2021-08-04 — End: 2021-08-04

## 2021-08-04 MED ORDER — AMLODIPINE BESYLATE 10 MG PO TABS
10.0000 mg | ORAL_TABLET | Freq: Every day | ORAL | Status: DC
  Administered 2021-08-04 – 2021-08-09 (×6): 10 mg via ORAL
  Filled 2021-08-04 (×6): qty 1

## 2021-08-04 MED ORDER — BISACODYL 10 MG RE SUPP: 10.0000 mg | Freq: Every day | RECTAL | Status: DC | PRN

## 2021-08-04 MED ORDER — SENNA 8.6 MG PO TABS
1.0000 | ORAL_TABLET | Freq: Two times a day (BID) | ORAL | Status: DC
  Administered 2021-08-04 – 2021-08-08 (×6): 8.6 mg via ORAL
  Filled 2021-08-04 (×9): qty 1

## 2021-08-04 MED ORDER — OXYCODONE HCL 5 MG PO TABS
5.0000 mg | ORAL_TABLET | ORAL | Status: DC | PRN
  Administered 2021-08-04 – 2021-08-05 (×4): 10 mg via ORAL
  Administered 2021-08-06: 5 mg via ORAL
  Administered 2021-08-06 – 2021-08-07 (×4): 10 mg via ORAL
  Filled 2021-08-04: qty 1
  Filled 2021-08-04 (×8): qty 2

## 2021-08-04 MED ORDER — SODIUM CHLORIDE 0.9% FLUSH
3.0000 mL | Freq: Two times a day (BID) | INTRAVENOUS | Status: DC
  Administered 2021-08-04 – 2021-08-09 (×10): 3 mL via INTRAVENOUS

## 2021-08-04 MED ORDER — ROCURONIUM BROMIDE 10 MG/ML (PF) SYRINGE
PREFILLED_SYRINGE | INTRAVENOUS | Status: DC | PRN
  Administered 2021-08-04: 90 mg via INTRAVENOUS
  Administered 2021-08-04: 10 mg via INTRAVENOUS

## 2021-08-04 MED ORDER — LIDOCAINE-EPINEPHRINE 1 %-1:100000 IJ SOLN
INTRAMUSCULAR | Status: DC | PRN
  Administered 2021-08-04: 5 mL via INTRADERMAL

## 2021-08-04 MED ORDER — SODIUM CHLORIDE 0.9% FLUSH: 3.0000 mL | INTRAVENOUS | Status: DC | PRN

## 2021-08-04 MED ORDER — GABAPENTIN 300 MG PO CAPS
300.0000 mg | ORAL_CAPSULE | Freq: Two times a day (BID) | ORAL | Status: DC
  Administered 2021-08-04 – 2021-08-09 (×11): 300 mg via ORAL
  Filled 2021-08-04 (×11): qty 1

## 2021-08-04 MED ORDER — MAGNESIUM SULFATE 2 GM/50ML IV SOLN
2.0000 g | Freq: Once | INTRAVENOUS | Status: AC
Start: 2021-08-04 — End: 2021-08-04
  Administered 2021-08-04: 2 g via INTRAVENOUS
  Filled 2021-08-04: qty 50

## 2021-08-04 MED ORDER — LACTATED RINGERS IV SOLN: INTRAVENOUS | Status: DC

## 2021-08-04 MED ORDER — THROMBIN 20000 UNITS EX SOLR
CUTANEOUS | Status: AC
  Filled 2021-08-04: qty 20000

## 2021-08-04 MED ORDER — PHENYLEPHRINE 80 MCG/ML (10ML) SYRINGE FOR IV PUSH (FOR BLOOD PRESSURE SUPPORT)
PREFILLED_SYRINGE | INTRAVENOUS | Status: DC | PRN
  Administered 2021-08-04 (×2): 240 ug via INTRAVENOUS

## 2021-08-04 MED ORDER — FENTANYL CITRATE PF 50 MCG/ML IJ SOSY
25.0000 ug | PREFILLED_SYRINGE | Freq: Once | INTRAMUSCULAR | Status: AC
  Administered 2021-08-04: 25 ug via INTRAVENOUS
  Filled 2021-08-04: qty 1

## 2021-08-04 MED ORDER — 0.9 % SODIUM CHLORIDE (POUR BTL) OPTIME
TOPICAL | Status: DC | PRN
  Administered 2021-08-04: 1000 mL

## 2021-08-04 MED ORDER — HYDRALAZINE HCL 20 MG/ML IJ SOLN: 10.0000 mg | INTRAMUSCULAR | Status: DC | PRN

## 2021-08-04 MED ORDER — DEXAMETHASONE SODIUM PHOSPHATE 4 MG/ML IJ SOLN
4.0000 mg | Freq: Four times a day (QID) | INTRAMUSCULAR | Status: DC
  Administered 2021-08-04 – 2021-08-05 (×3): 4 mg via INTRAVENOUS
  Filled 2021-08-04 (×3): qty 1

## 2021-08-04 MED ORDER — DEXAMETHASONE SODIUM PHOSPHATE 10 MG/ML IJ SOLN
INTRAMUSCULAR | Status: DC | PRN
  Administered 2021-08-04: 10 mg via INTRAVENOUS

## 2021-08-04 MED ORDER — ACETAMINOPHEN 325 MG PO TABS: 650.0000 mg | ORAL_TABLET | ORAL | Status: DC | PRN

## 2021-08-04 MED ORDER — ACETAMINOPHEN 10 MG/ML IV SOLN: 1000.0000 mg | Freq: Once | INTRAVENOUS | Status: DC | PRN

## 2021-08-04 MED ORDER — ATORVASTATIN CALCIUM 10 MG PO TABS
20.0000 mg | ORAL_TABLET | Freq: Every day | ORAL | Status: DC
  Administered 2021-08-05 – 2021-08-09 (×5): 20 mg via ORAL
  Filled 2021-08-04 (×5): qty 2

## 2021-08-04 MED ORDER — SUGAMMADEX SODIUM 200 MG/2ML IV SOLN
INTRAVENOUS | Status: DC | PRN
  Administered 2021-08-04: 200 mg via INTRAVENOUS

## 2021-08-04 MED ORDER — CHLORHEXIDINE GLUCONATE 0.12 % MT SOLN
OROMUCOSAL | Status: AC
  Administered 2021-08-04: 15 mL via OROMUCOSAL
  Filled 2021-08-04: qty 15

## 2021-08-04 MED ORDER — METHOCARBAMOL 500 MG PO TABS
500.0000 mg | ORAL_TABLET | Freq: Four times a day (QID) | ORAL | Status: DC | PRN
  Administered 2021-08-08: 500 mg via ORAL
  Filled 2021-08-04: qty 1

## 2021-08-04 MED ORDER — ORAL CARE MOUTH RINSE: 15.0000 mL | Freq: Once | OROMUCOSAL | Status: AC

## 2021-08-04 MED ORDER — LIDOCAINE 2% (20 MG/ML) 5 ML SYRINGE
INTRAMUSCULAR | Status: DC | PRN
  Administered 2021-08-04: 60 mg via INTRAVENOUS

## 2021-08-04 MED ORDER — ONDANSETRON HCL 4 MG/2ML IJ SOLN
INTRAMUSCULAR | Status: AC
  Filled 2021-08-04: qty 2

## 2021-08-04 MED ORDER — ONDANSETRON 4 MG PO TBDP: 4.0000 mg | ORAL_TABLET | Freq: Four times a day (QID) | ORAL | Status: DC | PRN

## 2021-08-04 MED ORDER — BUPIVACAINE HCL (PF) 0.5 % IJ SOLN
INTRAMUSCULAR | Status: DC | PRN
  Administered 2021-08-04: 5 mL

## 2021-08-04 MED ORDER — LABETALOL HCL 200 MG PO TABS
300.0000 mg | ORAL_TABLET | Freq: Two times a day (BID) | ORAL | Status: DC
  Administered 2021-08-04 – 2021-08-09 (×11): 300 mg via ORAL
  Filled 2021-08-04 (×11): qty 1

## 2021-08-04 MED ORDER — PROPOFOL 10 MG/ML IV BOLUS
INTRAVENOUS | Status: AC
  Filled 2021-08-04: qty 20

## 2021-08-04 MED ORDER — METHOCARBAMOL 1000 MG/10ML IJ SOLN
500.0000 mg | Freq: Four times a day (QID) | INTRAMUSCULAR | Status: DC | PRN
  Filled 2021-08-04: qty 5

## 2021-08-04 MED ORDER — CEFAZOLIN SODIUM-DEXTROSE 2-3 GM-%(50ML) IV SOLR
INTRAVENOUS | Status: DC | PRN
  Administered 2021-08-04: 2 g via INTRAVENOUS

## 2021-08-04 MED ORDER — ACETAMINOPHEN 10 MG/ML IV SOLN
INTRAVENOUS | Status: DC | PRN
  Administered 2021-08-04: 1000 mg via INTRAVENOUS

## 2021-08-04 MED ORDER — ONDANSETRON HCL 4 MG PO TABS: 4.0000 mg | ORAL_TABLET | Freq: Four times a day (QID) | ORAL | Status: DC | PRN

## 2021-08-04 MED ORDER — CHLORHEXIDINE GLUCONATE 0.12 % MT SOLN: 15.0000 mL | Freq: Once | OROMUCOSAL | Status: AC

## 2021-08-04 MED ORDER — OXYCODONE HCL 5 MG/5ML PO SOLN: 5.0000 mg | Freq: Once | ORAL | Status: DC | PRN

## 2021-08-04 MED ORDER — PROPOFOL 10 MG/ML IV BOLUS
INTRAVENOUS | Status: DC | PRN
  Administered 2021-08-04: 30 mg via INTRAVENOUS
  Administered 2021-08-04: 140 mg via INTRAVENOUS

## 2021-08-04 MED ORDER — TAMSULOSIN HCL 0.4 MG PO CAPS
0.4000 mg | ORAL_CAPSULE | Freq: Every day | ORAL | Status: DC
  Administered 2021-08-04 – 2021-08-09 (×6): 0.4 mg via ORAL
  Filled 2021-08-04 (×6): qty 1

## 2021-08-04 MED ORDER — THROMBIN 20000 UNITS EX SOLR
CUTANEOUS | Status: DC | PRN
  Administered 2021-08-04: 20 mL via TOPICAL

## 2021-08-04 MED ORDER — BUPIVACAINE HCL (PF) 0.5 % IJ SOLN
INTRAMUSCULAR | Status: AC
  Filled 2021-08-04: qty 30

## 2021-08-04 MED ORDER — ACETAMINOPHEN 325 MG PO TABS: 650.0000 mg | ORAL_TABLET | Freq: Four times a day (QID) | ORAL | Status: DC | PRN

## 2021-08-04 MED ORDER — FENTANYL CITRATE (PF) 100 MCG/2ML IJ SOLN: 25.0000 ug | INTRAMUSCULAR | Status: DC | PRN

## 2021-08-04 MED ORDER — LEFLUNOMIDE 20 MG PO TABS
20.0000 mg | ORAL_TABLET | Freq: Every day | ORAL | Status: DC
  Administered 2021-08-05 – 2021-08-09 (×5): 20 mg via ORAL
  Filled 2021-08-04 (×7): qty 1

## 2021-08-04 MED ORDER — METHOCARBAMOL 1000 MG/10ML IJ SOLN: 500.0000 mg | Freq: Four times a day (QID) | INTRAVENOUS | Status: DC | PRN

## 2021-08-04 MED ORDER — SODIUM CHLORIDE 0.9 % IV SOLN
250.0000 mL | INTRAVENOUS | Status: DC
Start: 2021-08-04 — End: 2021-08-09

## 2021-08-04 MED ORDER — BACITRACIN ZINC 500 UNIT/GM EX OINT
TOPICAL_OINTMENT | CUTANEOUS | Status: AC
  Filled 2021-08-04: qty 28.35

## 2021-08-04 MED ORDER — ONDANSETRON HCL 4 MG/2ML IJ SOLN
4.0000 mg | Freq: Four times a day (QID) | INTRAMUSCULAR | Status: DC | PRN
Start: 2021-08-04 — End: 2021-08-04

## 2021-08-04 SURGICAL SUPPLY — 75 items
ADH SKN CLS APL DERMABOND .7 (GAUZE/BANDAGES/DRESSINGS)
APL SKNCLS STERI-STRIP NONHPOA (GAUZE/BANDAGES/DRESSINGS) ×1
BAG COUNTER SPONGE SURGICOUNT (BAG) ×2 IMPLANT
BAG SPNG CNTER NS LX DISP (BAG) ×1
BAND INSRT 18 STRL LF DISP RB (MISCELLANEOUS)
BAND RUBBER #18 3X1/16 STRL (MISCELLANEOUS) IMPLANT
BASKET BONE COLLECTION (BASKET) ×1 IMPLANT
BENZOIN TINCTURE PRP APPL 2/3 (GAUZE/BANDAGES/DRESSINGS) ×1 IMPLANT
BIT DRILL NEURO 2X3.1 SFT TUCH (MISCELLANEOUS) ×1 IMPLANT
BIT DRILL NS LF DISP RELINE C (BIT) IMPLANT
BIT DRILL RELINE C (BIT) ×2
BIT DRL NS LF DISP RELINE C (BIT) ×1
BLADE BONE MILL FINE (MISCELLANEOUS) ×2
BLADE CLIPPER SURG (BLADE) IMPLANT
BLADE MILL BN FN STRL DISP (MISCELLANEOUS) IMPLANT
BLADE SURG 11 STRL SS (BLADE) IMPLANT
BLADE ULTRA TIP 2M (BLADE) IMPLANT
CANISTER SUCT 3000ML PPV (MISCELLANEOUS) ×2 IMPLANT
DERMABOND ADVANCED (GAUZE/BANDAGES/DRESSINGS)
DERMABOND ADVANCED .7 DNX12 (GAUZE/BANDAGES/DRESSINGS) IMPLANT
DEVICE DISSECT PLASMABLAD 3.0S (MISCELLANEOUS) ×1 IMPLANT
DRAPE LAPAROTOMY 100X72 PEDS (DRAPES) ×2 IMPLANT
DRAPE MICROSCOPE LEICA (MISCELLANEOUS) IMPLANT
DRILL NEURO 2X3.1 SOFT TOUCH (MISCELLANEOUS) ×2
DURAPREP 26ML APPLICATOR (WOUND CARE) ×2 IMPLANT
ELECT REM PT RETURN 9FT ADLT (ELECTROSURGICAL) ×2
ELECTRODE REM PT RTRN 9FT ADLT (ELECTROSURGICAL) ×1 IMPLANT
GAUZE 4X4 16PLY ~~LOC~~+RFID DBL (SPONGE) IMPLANT
GAUZE SPONGE 4X4 12PLY STRL (GAUZE/BANDAGES/DRESSINGS) ×1 IMPLANT
GLOVE BIOGEL PI IND STRL 8.5 (GLOVE) ×1 IMPLANT
GLOVE BIOGEL PI INDICATOR 8.5 (GLOVE) ×1
GLOVE ECLIPSE 8.5 STRL (GLOVE) ×2 IMPLANT
GLOVE EXAM NITRILE XL STR (GLOVE) IMPLANT
GOWN STRL REUS W/ TWL LRG LVL3 (GOWN DISPOSABLE) IMPLANT
GOWN STRL REUS W/ TWL XL LVL3 (GOWN DISPOSABLE) ×1 IMPLANT
GOWN STRL REUS W/TWL 2XL LVL3 (GOWN DISPOSABLE) ×2 IMPLANT
GOWN STRL REUS W/TWL LRG LVL3 (GOWN DISPOSABLE) ×2
GOWN STRL REUS W/TWL XL LVL3 (GOWN DISPOSABLE) ×2
HEMOSTAT POWDER KIT SURGIFOAM (HEMOSTASIS) ×2 IMPLANT
HEMOSTAT SURGICEL 2X14 (HEMOSTASIS) IMPLANT
KIT BASIN OR (CUSTOM PROCEDURE TRAY) ×2 IMPLANT
KIT TURNOVER KIT B (KITS) ×2 IMPLANT
NDL SPNL 18GX3.5 QUINCKE PK (NEEDLE) IMPLANT
NEEDLE HYPO 22GX1.5 SAFETY (NEEDLE) ×2 IMPLANT
NEEDLE SPNL 18GX3.5 QUINCKE PK (NEEDLE) ×2 IMPLANT
NS IRRIG 1000ML POUR BTL (IV SOLUTION) ×2 IMPLANT
PACK LAMINECTOMY NEURO (CUSTOM PROCEDURE TRAY) ×2 IMPLANT
PAD ARMBOARD 7.5X6 YLW CONV (MISCELLANEOUS) ×6 IMPLANT
PATTIES SURGICAL .25X.25 (GAUZE/BANDAGES/DRESSINGS) IMPLANT
PIN MAYFIELD SKULL DISP (PIN) ×2 IMPLANT
PLASMABLADE 3.0S (MISCELLANEOUS) ×2
ROD PB RELINE C 3.5X80 (Rod) ×2 IMPLANT
SCREW FA RELINE C 3.5X16 (Screw) ×4 IMPLANT
SCREW LOCK RELINE C OPEN (Screw) ×10 IMPLANT
SCREW MA RELINE C 3.5X14 (Screw) ×14 IMPLANT
SCREW MA RELINE C 3.5X16 (Screw) ×4 IMPLANT
SCREW SP FA RELINE-C 3.5X16 (Screw) IMPLANT
SCREW SP MA RELINE-C 3.5X14 (Screw) IMPLANT
SCREW SP MA RELINE-C 3.5X16 (Screw) IMPLANT
SPIKE FLUID TRANSFER (MISCELLANEOUS) ×2 IMPLANT
SPONGE T-LAP 4X18 ~~LOC~~+RFID (SPONGE) IMPLANT
STAPLER SKIN PROX WIDE 3.9 (STAPLE) ×2 IMPLANT
STRIP CLOSURE SKIN 1/2X4 (GAUZE/BANDAGES/DRESSINGS) IMPLANT
SUT ETHILON 3 0 FSL (SUTURE) IMPLANT
SUT VIC AB 0 CT1 18XCR BRD8 (SUTURE) ×1 IMPLANT
SUT VIC AB 0 CT1 8-18 (SUTURE) ×2
SUT VIC AB 2-0 CP2 18 (SUTURE) ×2 IMPLANT
SUT VIC AB 3-0 SH 8-18 (SUTURE) ×1 IMPLANT
SUT VIC AB 4-0 RB1 18 (SUTURE) ×2 IMPLANT
TAPE CLOTH SURG 4X10 WHT LF (GAUZE/BANDAGES/DRESSINGS) ×1 IMPLANT
TOWEL GREEN STERILE (TOWEL DISPOSABLE) ×2 IMPLANT
TOWEL GREEN STERILE FF (TOWEL DISPOSABLE) ×2 IMPLANT
TRAY FOLEY MTR SLVR 16FR STAT (SET/KITS/TRAYS/PACK) ×1 IMPLANT
TUBE CONNECTING 20X1/4 (TUBING) ×1 IMPLANT
WATER STERILE IRR 1000ML POUR (IV SOLUTION) ×2 IMPLANT

## 2021-08-04 NOTE — Progress Notes (Signed)
Patient arrived to room 3W28 in NAD, VS stable and patient free from pain. Patient oriented to room and call bell in reach. Son at bedside.
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
## 2021-08-04 NOTE — H&P (Signed)
History and Physical    Patient: Shawn Decker [id number] DOB: 1945-07-15 DOA: 08/03/2021 DOS: the patient was seen and examined on 08/04/2021 PCP: [facility]  Patient coming from: Home - lives alone; NOK: [place], [place], [phone]   Chief Complaint: N/W/T  HPI: Shawn Decker is a 76 y.o. male with medical history significant of HTN, HLD, and DM presenting with numbness/weakness/tingling.  He reports having a long h/o peripheral neuropathy.  He also remotely had C2-3 and 3-4 fusions.  About 2 weeks ago, he noticed tingling in his B fingers, symmetric.  He also noticed around the same time worsening tingling/numbness in his B feet.  It has progressed to weakness.  3 weeks ago, he was able to ambulate independently; now he can barely get around with a walker.  He is unable to lift his arms above his chest.  His legs are increasingly weak.  No bowel/bladder incontinence.    ER Course:  Carryover, per Dr. Myna Hidalgo:  76 yr old man with HTN, HLD, and CKD III who presents with 2 wks of numbness/tingling in UEs, progressive weakness in b/l LEs and shoulders, and numbness in saddle area, and is found on MRI to have severe cervical stenosis with cord signal abnormality. Dr. Ellene Route of neurosurgery will see him in consultation and recommended starting Decadron.     Review of Systems: As mentioned in the history of present illness. All other systems reviewed and are negative. Past Medical History:  Diagnosis Date   Diabetes mellitus without complication ([place])    Hyperlipidemia    Hypertension    Past Surgical History:  Procedure Laterality Date   ANTERIOR CERVICAL DECOMP/DISCECTOMY FUSION     C2-3, 3-4   EYE SURGERY     Social History:  reports that he has quit smoking. He has never used smokeless tobacco. He reports current alcohol use. He reports that he does not use drugs.  No Known Allergies  History reviewed. No pertinent family history.  Prior to  Admission medications   Medication Sig Start Date End Date Taking? Authorizing Provider  aMILoride (MIDAMOR) 5 MG tablet Take 5 mg by mouth every Monday, Wednesday, and Friday. 07/19/21  Yes [provider]  amLODipine (NORVASC) 10 MG tablet Take 10 mg by mouth daily.   Yes [provider]  atorvastatin (LIPITOR) 20 MG tablet Take by mouth. 09/27/15 02/22/22 Yes [provider]  HYDROcodone-acetaminophen (NORCO/VICODIN) 5-325 MG tablet Take 1 tablet by mouth every 6 (six) hours as needed. 09/21/17  Yes Law, Bea Graff, PA-C  ibuprofen (ADVIL,MOTRIN) 400 MG tablet Take 1 tablet (400 mg total) by mouth every 6 (six) hours as needed for mild pain or moderate pain. 09/21/17  Yes Law, Bea Graff, PA-C  labetalol (NORMODYNE) 300 MG tablet Take 300 mg by mouth 2 (two) times daily. 04/16/21  Yes [provider]  leflunomide (ARAVA) 20 MG tablet Take 1 tablet by mouth daily. 09/27/15  Yes [provider]  losartan (COZAAR) 100 MG tablet Take 100 mg by mouth daily.   Yes [provider]  magnesium oxide (MAG-OX) 400 MG tablet Take by mouth. 07/27/21  Yes [provider]  tadalafil (CIALIS) 5 MG tablet Take by mouth. 09/20/20 09/20/21 Yes [provider]  tamsulosin (FLOMAX) 0.4 MG CAPS capsule Take 0.4 mg by mouth.   Yes [provider]  gabapentin (NEURONTIN) 300 MG capsule Take 1 capsule (300 mg total) by mouth 2 (two) times daily. 08/30/15   Little, Wenda Overland, MD  montelukast (SINGULAIR) 10 MG tablet Take 10 mg by mouth at bedtime. Patient not taking: Reported on 08/04/2021    [provider]    Physical Exam: Vitals:   08/03/21 2330 08/04/21 0137 08/04/21 0653 08/04/21 0831  BP: (!) 165/71 (!) 180/79 (!) 147/64 (!) 174/80  Pulse: 66 70 73 74  Resp: 15 16 18 18   Temp:  98 F (36.7 C) 98.1 F (36.7 C) 98.1 F (36.7 C)  TempSrc:  Oral Oral Oral  SpO2: 98% 100% 99% 99%  Weight:      Height:       General:  Appears calm  and comfortable and is in NAD Eyes:  EOMI, normal lids, iris ENT:  grossly normal hearing, lips & tongue, mmm; artificial dentition Neck:  no LAD, masses or thyromegaly Cardiovascular:  RRR, no m/r/g. No LE edema.  Respiratory:   CTA bilaterally with no wheezes/rales/rhonchi.  Normal respiratory effort. Abdomen:  soft, NT, ND Skin:  no rash or induration seen on limited exam Musculoskeletal:  difficulty raising arms with flexion > extension, some decreased grip strength; leg strength 3/5 B Lower extremity:  No LE edema.  Limited foot exam with no ulcerations.  2+ distal pulses. Psychiatric:  blunted mood and affect, speech fluent and appropriate, AOx3 Neurologic:  CN 2-12 grossly intact, moves all extremities in coordinated fashion, sensation diminished on B hands and feet   Radiological Exams on Admission: Independently reviewed - see discussion in A/P where applicable  MR Cervical Spine Wo Contrast  Addendum Date: 08/04/2021   ADDENDUM REPORT: 08/04/2021 04:54 ADDENDUM: Study discussed by telephone with Dr. 08/06/2021 on 08/04/2021 at 0450 hours. Electronically Signed   By: 08/06/2021 M.D.   On: 08/04/2021 04:54   Result Date: 08/04/2021 CLINICAL DATA:  76 year old male with weakness. Two weeks of extremity numbness. Progressive lower extremity weakness. Progressive EXAM: MRI CERVICAL SPINE WITHOUT CONTRAST TECHNIQUE: Multiplanar, multisequence MR imaging of the cervical spine was performed. No intravenous contrast was administered. COMPARISON:  Brain MRI today reported separately. Cervical spine CT 09/21/2017. FINDINGS: Alignment: Chronic straightening of cervical lordosis. No significant spondylolisthesis. Vertebrae: Chronic C3-C4 and C4-C5 ACDF. Mild hardware artifact at those levels. No marrow edema or evidence of acute osseous abnormality. Background bone marrow signal within normal limits. Cord: Abnormal. Moderate to severe spinal stenosis at C2-C3 is associated with abnormal cord signal on  series 7, image 8. and there is additional abnormal cord signal at the C4 cervical level, suggesting myelomalacia despite absence of stenosis (series 8, image 20). And moderate to severe spinal stenosis at C5-C6 also with evidence of abnormal cord signal just caudal to the cord mass effect there (series 8, image 26 and series 5, image 8). Visible upper thoracic canal and spinal cord appear more normal. Posterior Fossa, vertebral arteries, paraspinal tissues: Cervicomedullary junction is within normal limits. Brain is detailed separately. Preserved major vascular flow voids in the neck. The left vertebral artery appears mildly dominant. Negative visible neck soft tissues and lung apices. Disc levels: C2-C3: Bulky posterior disc bulge or protrusion superimposed on severe facet and ligament flavum hypertrophy. Moderate to severe spinal stenosis and spinal cord mass effect. Abnormal cord signal as above. Mild left but moderate to severe right C3 foraminal stenosis. C3-C4: Prior ACDF. Questionable pseudoarthrosis here on the 2019 CT, although suggestion now of facet ankylosis on the right. No spinal stenosis. Residual moderate to severe bilateral C4 foraminal stenosis. C4-C5: Prior ACDF. Solid arthrodesis here in 2019. No stenosis, although abnormal cord signal as  detailed above. C5-C6: Bulky posterior disc bulge or protrusion and moderate to severe facet and ligament flavum hypertrophy. Moderate to severe spinal stenosis (series 8, image 24) with severe left and moderate to severe right C6 foraminal stenosis. C6-C7: Circumferential disc bulge with a broad-based posterior component. Moderate ligament flavum and mild to moderate facet hypertrophy. Borderline spinal stenosis. Mild to moderate left C7 foraminal stenosis. C7-T1: Lobulated posterior disc bulge or protrusion eccentric to the right. Moderate facet and ligament flavum hypertrophy. Borderline to mild spinal stenosis. Mild left and mild to moderate right C8  foraminal stenosis. IMPRESSION: 1. Previous C3-C4 and C4-C5 ACDF with Severe adjacent segment disease, spinal stenosis, spinal cord mass effect, AND abnormal cord signal at both C2-C3 and C5-C6 adjacent segments. This could reflect a combination of cord edema and myelomalacia. 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 Recommend Neurosurgery consultation. 2. Associated moderate to severe bilateral C4 and C6 foraminal stenosis. And borderline to mild spinal stenosis at C6-C7 and C7-T1 with up to moderate C7 and C8 foraminal stenosis. Electronically Signed: By: Genevie Ann M.D. On: 08/04/2021 04:46   MR BRAIN WO CONTRAST  Result Date: 08/04/2021 CLINICAL DATA:  77 year old male with weakness. Two weeks of extremity numbness. Progressive lower extremity weakness. Progressive EXAM: MRI HEAD WITHOUT CONTRAST TECHNIQUE: Multiplanar, multiecho pulse sequences of the brain and surrounding structures were obtained without intravenous contrast. COMPARISON:  Cervical spine MRI today reported separately. FINDINGS: Brain: No restricted diffusion to suggest acute infarction. No midline shift, mass effect, evidence of mass lesion, ventriculomegaly, extra-axial collection or acute intracranial hemorrhage. Cervicomedullary junction and pituitary are within normal limits. Cerebral volume is within normal limits for age. Pearline Cables and white matter signal is within normal limits for age throughout the brain. No cortical encephalomalacia or chronic cerebral blood products identified. Vascular: Major intracranial vascular flow voids are preserved. Skull and upper cervical spine: Cervical spine detailed separately. Visualized bone marrow signal is within normal limits. Sinuses/Orbits: Postoperative changes to both globes. Paranasal sinuses and mastoids are well aerated. Other: Negative visible scalp and face. IMPRESSION: Normal for age noncontrast MRI appearance of the Brain. Electronically Signed   By: Genevie Ann M.D.    On: 08/04/2021 04:38    EKG: Independently reviewed.  NSR with rate 81; nonspecific ST changes with no evidence of acute ischemia   Labs on Admission: I have personally reviewed the available labs and imaging studies at the time of the admission.  Pertinent labs:    Glucose 104 BUN 22/Creatinine 1.59/GFR 45 - stable Mag++ 1.4 WBC 6 Hgb 10.9    Assessment and Plan: Principal Problem:   Cervical spinal stenosis Active Problems:   Diabetes mellitus without complication ([facility])   Hyperlipidemia   Hypertension   Stage 3a chronic kidney disease (CKD) ([facility])   DNR (do not resuscitate)    Cervical stenosis -Patient with prior h/o fusion at C2-3, 3-4 now presenting with radiculopathy of B hands and feet and progressive weakness of both upper and lower extremities -Imaging indicates severe disease adjacent to prior fusions with stenosis and spinal cord mass effect and abnormal cord signal - myleopathy -He appears to need neurosurgical intervention -Dr. Ellene Route from neurosurgery has been consulted; he is taking the patient to the OR today -Will admit to med surg at this time -Continue lefunomide -Pain control with oxy, morphine, robaxin as needed -Will start Decadron  HTN -Continue home meds - amiloride, amlodipine, labetalol -Hold losartan and follow renal function  HLD -Continue atorvastatin  DM -  Glucose is 104 -Not on home meds -Will follow for now without intervention, but he may require at least SSI - particularly with steroids  Stage 3a CKD -Appears to be stable at this time but slightly worse than prior -Will hold losartan for now -Will follow  DNR -I have discussed code status with the patient and his son and they are in agreement that the patient would not desire resuscitation and would prefer to die a natural death should that situation arise. -He will need a gold out of facility DNR form at the time of discharge      Advance Care Planning:   Code Status: DNR    Consults: Neurosurgery  DVT Prophylaxis: SCDs  Family Communication: Son was present throughout evaluation  Severity of Illness: The appropriate patient status for this patient is INPATIENT. Inpatient status is judged to be reasonable and necessary in order to provide the required intensity of service to ensure the patient's safety. The patient's presenting symptoms, physical exam findings, and initial radiographic and laboratory data in the context of their chronic comorbidities is felt to place them at high risk for further clinical deterioration. Furthermore, it is not anticipated that the patient will be medically stable for discharge from the hospital within 2 midnights of admission.   * I certify that at the point of admission it is my clinical judgment that the patient will require inpatient hospital care spanning beyond 2 midnights from the point of admission due to high intensity of service, high risk for further deterioration and high frequency of surveillance required.*  Author: Jonah Blue, MD 08/04/2021 10:42 AM  For on call review [email or web address].

## 2021-08-04 NOTE — Progress Notes (Signed)
Patient off floor to OR

## 2021-08-04 NOTE — ED Notes (Signed)
Megan RN at [facility] called and given update on transport.

## 2021-08-04 NOTE — ED Provider Notes (Signed)
Unable to tolerate T/L spine MRI   RRR CTAB NABS Able to lift BLE and BUE  Case d/w Dr. Danielle Dess.  Please give dexamethasone 10 mg and admit to medicine NSG will consult    Kenyah Luba, MD 08/04/21 [id number]

## 2021-08-04 NOTE — Anesthesia Preprocedure Evaluation (Signed)
Anesthesia Evaluation  Patient identified by MRN, date of birth, ID band Patient awake    Reviewed: Allergy & Precautions, NPO status , Patient's Chart, lab work & pertinent test results, reviewed documented beta blocker date and time   History of Anesthesia Complications Negative for: history of anesthetic complications  Airway Mallampati: III  TM Distance: >3 FB Neck ROM: Limited    Dental  (+) Edentulous Upper, Missing,    Pulmonary neg shortness of breath, neg sleep apnea, neg COPD, neg recent URI, former smoker,    breath sounds clear to auscultation       Cardiovascular hypertension, Pt. on medications and Pt. on home beta blockers (-) angina(-) Past MI and (-) CHF  Rhythm:Regular     Neuro/Psych Cervical spinal stenosis with bilateral LE weakness  Neuromuscular disease    GI/Hepatic negative GI ROS, Neg liver ROS,   Endo/Other  diabetesNo results found for: "HGBA1C"   Renal/GU CRFRenal diseaseLab Results      Component                Value               Date                      CREATININE               1.59 (H)            08/03/2021                Musculoskeletal negative musculoskeletal ROS (+)   Abdominal   Peds  Hematology  (+) Blood dyscrasia, anemia , Lab Results      Component                Value               Date                      WBC                      6.0                 08/03/2021                HGB                      10.9 (L)            08/03/2021                HCT                      33.3 (L)            08/03/2021                MCV                      85.6                08/03/2021                PLT                      234                 08/03/2021  Anesthesia Other Findings   Reproductive/Obstetrics                             Anesthesia Physical Anesthesia Plan  ASA: 4  Anesthesia Plan: General   Post-op Pain Management: Ofirmev IV  (intra-op)* and Gabapentin PO (pre-op)*   Induction: Intravenous  PONV Risk Score and Plan: 2 and Ondansetron and Dexamethasone  Airway Management Planned: Oral ETT and Video Laryngoscope Planned  Additional Equipment: None  Intra-op Plan:   Post-operative Plan: Extubation in OR  Informed Consent: I have reviewed the patients History and Physical, chart, labs and discussed the procedure including the risks, benefits and alternatives for the proposed anesthesia with the patient or authorized representative who has indicated his/her understanding and acceptance.     Dental advisory given  Plan Discussed with: CRNA  Anesthesia Plan Comments:         Anesthesia Quick Evaluation

## 2021-08-04 NOTE — Anesthesia Procedure Notes (Signed)
Procedure Name: Intubation Date/Time: 08/04/2021 12:05 PM  Performed by: Shireen Quan, CRNAPre-anesthesia Checklist: Patient identified, Emergency Drugs available, Suction available and Patient being monitored Patient Re-evaluated:Patient Re-evaluated prior to induction Oxygen Delivery Method: Circle System Utilized Preoxygenation: Pre-oxygenation with 100% oxygen Induction Type: IV induction Ventilation: Mask ventilation without difficulty Laryngoscope Size: Glidescope and 4 Grade View: Grade II Tube type: Oral Tube size: 7.5 mm Number of attempts: 1 Airway Equipment and Method: Rigid stylet and Video-laryngoscopy Placement Confirmation: ETT inserted through vocal cords under direct vision, positive ETCO2 and breath sounds checked- equal and bilateral Secured at: 21 cm Tube secured with: Tape Dental Injury: Teeth and Oropharynx as per pre-operative assessment

## 2021-08-04 NOTE — ED Notes (Signed)
[facility] at bedside for transport. 

## 2021-08-04 NOTE — Progress Notes (Signed)
Orthopedic Tech Progress Note Patient Details:  Shawn Decker [birth year] [id number]  Ortho Devices Type of Ortho Device: Aspen cervical collar Ortho Device/Splint Location: Neck Ortho Device/Splint Interventions: Ordered      Genelle Bal Rebecah Dangerfield 08/04/2021, 4:02 PM

## 2021-08-04 NOTE — Consult Note (Signed)
Reason for Consult: Weakness in the arms and the legs with numbness in the hands. Referring Physician: Jonah Blue, MD  Shawn Decker is an 76 y.o. male.  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  He was treated in [place] by Dr. Manson Passey who performed an anterior cervical decompression at C3-4 and C4-5.  He has evidence of a myelomalacia in that region but now he has severe stenosis at C2-3 and C5-6 each area shown some inflammatory change in possible myelomalacia that is new cord signal change in this region.  He has been developing significant weakness in his legs with numbness and he has weakness in both his upper extremities he was seen yesterday at [facility] and transferred here for an MRI.  MRIs of the entire neural axis were planned but he could only tolerate the cervical MRI which demonstrated high-grade cord compression at C2-3 and C5-6.  Past Medical History:  Diagnosis Date   Diabetes mellitus without complication (HCC)    Hyperlipidemia    Hypertension     Past Surgical History:  Procedure Laterality Date   ANTERIOR CERVICAL DECOMP/DISCECTOMY FUSION     C2-3, 3-4   EYE SURGERY      History reviewed. No pertinent family history.  Social History:  reports that he has quit smoking. He has never used smokeless tobacco. He reports current alcohol use. He reports that he does not use drugs.  Allergies: No Known Allergies  Medications: I have reviewed the patient's current medications.  Results for orders placed or performed during the hospital encounter of 08/03/21 (from the past 48 hour(s))  Basic metabolic panel     Status: Abnormal   Collection Time: 08/03/21  4:05 PM  Result Value Ref Range   Sodium 138 135 - 145 mmol/L   Potassium 3.8 3.5 - 5.1 mmol/L   Chloride 108 98 - 111 mmol/L   CO2 24 22 - 32 mmol/L   Glucose, Bld 104 (H) 70 - 99 mg/dL    Comment: Glucose reference range applies only  to samples taken after fasting for at least 8 hours.   BUN 22 8 - 23 mg/dL   Creatinine, Ser 8.29 (H) 0.61 - 1.24 mg/dL   Calcium 9.1 8.9 - 93.7 mg/dL   GFR, Estimated 45 (L) >60 mL/min    Comment: (NOTE) Calculated using the CKD-EPI Creatinine Equation (2021)    Anion gap 6 5 - 15    Comment: Performed at [facility], [address] [place], [place] [address]  CBC with Differential     Status: Abnormal   Collection Time: 08/03/21  4:05 PM  Result Value Ref Range   WBC 6.0 4.0 - 10.5 K/uL   RBC 3.89 (L) 4.22 - 5.81 MIL/uL   Hemoglobin 10.9 (L) 13.0 - 17.0 g/dL   HCT 89.3 (L) 81.0 - 17.5 %   MCV 85.6 80.0 - 100.0 fL   MCH 28.0 26.0 - 34.0 pg   MCHC 32.7 30.0 - 36.0 g/dL   RDW 10.2 58.5 - 27.7 %   Platelets 234 150 - 400 K/uL   nRBC 0.0 0.0 - 0.2 %   Neutrophils Relative % 61 %   Neutro Abs 3.7 1.7 - 7.7 K/uL   Lymphocytes Relative 22 %   Lymphs Abs 1.3 0.7 - 4.0 K/uL   Monocytes Relative 13 %   Monocytes Absolute 0.8 0.1 - 1.0 K/uL   Eosinophils Relative  3 %   Eosinophils Absolute 0.2 0.0 - 0.5 K/uL   Basophils Relative 1 %   Basophils Absolute 0.1 0.0 - 0.1 K/uL   Immature Granulocytes 0 %   Abs Immature Granulocytes 0.01 0.00 - 0.07 K/uL    Comment: Performed at [facility], [address], [place], [place] [address]  Magnesium     Status: Abnormal   Collection Time: 08/03/21  4:05 PM  Result Value Ref Range   Magnesium 1.4 (L) 1.7 - 2.4 mg/dL    Comment: Performed at [facility], [address] [place], [place] [address]    MR Cervical Spine Wo Contrast  Addendum Date: 08/04/2021   ADDENDUM REPORT: 08/04/2021 04:54 ADDENDUM: Study discussed by telephone with Dr. Nicanor Alcon on 08/04/2021 at 0450 hours. Electronically Signed   By: Odessa Fleming M.D.   On: 08/04/2021 04:54   Result Date: 08/04/2021 CLINICAL DATA:  76 year old male with weakness. Two weeks of extremity numbness. Progressive lower extremity weakness. Progressive EXAM: MRI  CERVICAL SPINE WITHOUT CONTRAST TECHNIQUE: Multiplanar, multisequence MR imaging of the cervical spine was performed. No intravenous contrast was administered. COMPARISON:  Brain MRI today reported separately. Cervical spine CT 09/21/2017. FINDINGS: Alignment: Chronic straightening of cervical lordosis. No significant spondylolisthesis. Vertebrae: Chronic C3-C4 and C4-C5 ACDF. Mild hardware artifact at those levels. No marrow edema or evidence of acute osseous abnormality. Background bone marrow signal within normal limits. Cord: Abnormal. Moderate to severe spinal stenosis at C2-C3 is associated with abnormal cord signal on series 7, image 8. and there is additional abnormal cord signal at the C4 cervical level, suggesting myelomalacia despite absence of stenosis (series 8, image 20). And moderate to severe spinal stenosis at C5-C6 also with evidence of abnormal cord signal just caudal to the cord mass effect there (series 8, image 26 and series 5, image 8). Visible upper thoracic canal and spinal cord appear more normal. Posterior Fossa, vertebral arteries, paraspinal tissues: Cervicomedullary junction is within normal limits. Brain is detailed separately. Preserved major vascular flow voids in the neck. The left vertebral artery appears mildly dominant. Negative visible neck soft tissues and lung apices. Disc levels: C2-C3: Bulky posterior disc bulge or protrusion superimposed on severe facet and ligament flavum hypertrophy. Moderate to severe spinal stenosis and spinal cord mass effect. Abnormal cord signal as above. Mild left but moderate to severe right C3 foraminal stenosis. C3-C4: Prior ACDF. Questionable pseudoarthrosis here on the 2019 CT, although suggestion now of facet ankylosis on the right. No spinal stenosis. Residual moderate to severe bilateral C4 foraminal stenosis. C4-C5: Prior ACDF. Solid arthrodesis here in 2019. No stenosis, although abnormal cord signal as detailed above. C5-C6: Bulky  posterior disc bulge or protrusion and moderate to severe facet and ligament flavum hypertrophy. Moderate to severe spinal stenosis (series 8, image 24) with severe left and moderate to severe right C6 foraminal stenosis. C6-C7: Circumferential disc bulge with a broad-based posterior component. Moderate ligament flavum and mild to moderate facet hypertrophy. Borderline spinal stenosis. Mild to moderate left C7 foraminal stenosis. C7-T1: Lobulated posterior disc bulge or protrusion eccentric to the right. Moderate facet and ligament flavum hypertrophy. Borderline to mild spinal stenosis. Mild left and mild to moderate right C8 foraminal stenosis. IMPRESSION: 1. Previous C3-C4 and C4-C5 ACDF with Severe adjacent segment disease, spinal stenosis, spinal cord mass effect, AND abnormal cord signal at both C2-C3 and C5-C6 adjacent segments. This could reflect a combination of cord edema and myelomalacia. 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 Recommend Neurosurgery consultation. 2. Associated moderate to severe bilateral C4 and C6 foraminal stenosis. And borderline to mild spinal stenosis at C6-C7 and C7-T1 with up to moderate C7 and C8 foraminal stenosis. Electronically Signed: By: Genevie Ann M.D. On: 08/04/2021 04:46   MR BRAIN WO CONTRAST  Result Date: 08/04/2021 CLINICAL DATA:  76 year old male with weakness. Two weeks of extremity numbness. Progressive lower extremity weakness. Progressive EXAM: MRI HEAD WITHOUT CONTRAST TECHNIQUE: Multiplanar, multiecho pulse sequences of the brain and surrounding structures were obtained without intravenous contrast. COMPARISON:  Cervical spine MRI today reported separately. FINDINGS: Brain: No restricted diffusion to suggest acute infarction. No midline shift, mass effect, evidence of mass lesion, ventriculomegaly, extra-axial collection or acute intracranial hemorrhage. Cervicomedullary junction and pituitary are within normal limits. Cerebral volume  is within normal limits for age. Pearline Cables and white matter signal is within normal limits for age throughout the brain. No cortical encephalomalacia or chronic cerebral blood products identified. Vascular: Major intracranial vascular flow voids are preserved. Skull and upper cervical spine: Cervical spine detailed separately. Visualized bone marrow signal is within normal limits. Sinuses/Orbits: Postoperative changes to both globes. Paranasal sinuses and mastoids are well aerated. Other: Negative visible scalp and face. IMPRESSION: Normal for age noncontrast MRI appearance of the Brain. Electronically Signed   By: Genevie Ann M.D.   On: 08/04/2021 04:38    Review of Systems  Musculoskeletal:  Positive for arthralgias, joint swelling and myalgias.  Neurological:  Positive for weakness and numbness.  All other systems reviewed and are negative.  Blood pressure (!) 174/80, pulse 74, temperature 98.1 F (36.7 C), temperature source Oral, resp. rate 18, height 5\' 8"  (1.727 m), weight 81.6 kg, SpO2 99 %. Physical Exam Constitutional:      Appearance: Normal appearance. He is normal weight.  HENT:     Head: Normocephalic and atraumatic.     Right Ear: Tympanic membrane normal.     Left Ear: Tympanic membrane normal.     Nose: Nose normal.     Mouth/Throat:     Mouth: Mucous membranes are moist.     Pharynx: Oropharynx is clear.  Eyes:     Extraocular Movements: Extraocular movements intact.     Conjunctiva/sclera: Conjunctivae normal.     Pupils: Pupils are equal, round, and reactive to light.  Neck:     Comments: Markedly decreased range of motion turning only 30 degrees left and right flexing and extending less than 50% of all Cardiovascular:     Rate and Rhythm: Normal rate and regular rhythm.     Pulses: Normal pulses.     Heart sounds: Normal heart sounds.  Pulmonary:     Effort: Pulmonary effort is normal.     Breath sounds: Normal breath sounds.  Abdominal:     General: Abdomen is flat.  Bowel sounds are normal.     Palpations: Abdomen is soft.  Neurological:     Mental Status: He is alert.     Comments: Weakness in the upper extremities characterized by weakness in the grip at 4- out of 5 weakness in the intrinsics at 4- out of 5 modest atrophy in the intrinsic musculature is noted also no fasciculations are noted but the deltoid strength is 4 out of 5 triceps strength is 4- out of 5 bilaterally weaker on the right than on the left bicep strength is 4 out of 5.  Sensation is diminished in the upper extremities distally particularly on the ulnar aspects  lower extremity strength reveals modest weakness in the dorsiflexors at 4- out of 5 plantar flexor strength appears intact reflexes are hyperreflexive in the patellae at 3+ with a positive Babinski bilaterally and 1+  Cranial nerve examination is normal  Psychiatric:        Mood and Affect: Mood normal.        Behavior: Behavior normal.        Thought Content: Thought content normal.        Judgment: Judgment normal.     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  Weakness in the upper extremities and lower extremities.  Myelopathy.  Plan: Posterior cervical decompression C2-3 C4-C5-C6 with posterior fixation C2-C7.  Blanchie Dessert Jomaira Darr 08/04/2021, 9:24 AM

## 2021-08-04 NOTE — Progress Notes (Signed)
Patient back to floor from OR in NAD, VS stable and patient free from pain. Family at bedside.

## 2021-08-04 NOTE — Transfer of Care (Signed)
Immediate Anesthesia Transfer of Care Note  Patient: Shawn Decker  Procedure(s) Performed: POSTERIOR CERVICAL FUSION/FORAMINOTOMY FIXATION Cervical two -Cervical six (Neck)  Patient Location: PACU  Anesthesia Type:General  Level of Consciousness: drowsy and patient cooperative  Airway & Oxygen Therapy: Patient Spontanous Breathing and Patient connected to nasal cannula oxygen  Post-op Assessment: Report given to RN, Post -op Vital signs reviewed and stable and Patient moving all extremities  Post vital signs: Reviewed and stable  Last Vitals:  Vitals Value Taken Time  BP 151/79 08/04/21 1535  Temp    Pulse 91 08/04/21 1538  Resp 22 08/04/21 1538  SpO2 98 % 08/04/21 1538  Vitals shown include unvalidated device data.  Last Pain:  Vitals:   08/04/21 1042  TempSrc: Oral  PainSc:          Complications: No notable events documented.

## 2021-08-04 NOTE — Op Note (Signed)
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 Postoperative diagnosis: Same Procedure: Cervical laminectomy C2-3 and C5-6 with decompression of spinal canal posterior segmental fixation from C2-C6 with facet screws posterolateral arthrodesis with autograft C2-3 C5-6.  Fluoroscopic guidance Surgeon: Barnett Abu Anesthesia: General endotracheal Indications: Shawn Decker is a 76 year old individual who had developed progressive weakness in the arms and legs over the past month and a half he was seen yesterday in the hospital and an MRI demonstrated the presence of severe spinal stenosis at the level of C2-3 and at the level of C5-6 the patient has had a previous anterior decompression in 2017 by Dr. Manson Passey from C3-C5.  He was evaluated and noted to be significantly weak in the upper extremities and advised regarding surgical decompression and arthrodesis he is taken to the operating room for this process.  Procedure: Patient was brought to the operating room supine on the stretcher after the smooth induction of general endotracheal anesthesia, he was carefully placed in a 3 pin headrest and then turned prone onto the operating table.  The head was secured in a neutral fashion.  The shoulders were taped inferiorly.  The back of the neck was prepped with alcohol DuraPrep and draped in a sterile fashion.  A vertical incision was created in the neck and carried down to the cervical dorsal fascia the fascia was opened on either side of midline to expose the spinous processes from C2-C6 these areas were identified positively with fluoroscopic guidance.  Then the dissection was carried out laterally to expose the lateral mass of C2 C3-C4-C5 and C6.  C2-3 was noted to have some mobility.  Then facet entry sites were chosen at C2 C3-C4-C5 and C6 and these were verified fluoroscopically at C2 a 72mm screw was placed in the lateral projection at C3-C4 and  C5 14 mm screws were used C3-C5 was fused posteriorly already.  C6 had a 16 mm screw placed and position was verified fluoroscopically.  Then the screws were removed at C2 and C3 and laminectomy was undertaken removing the inferior margin lamina of C2 and undercutting it substantially along with the superior margin and entirety of the lamina at C3.  The L ligament was taken up and the common dural tube was well decompressed dorsally.  Once this was verified at C2-3 the screws were replaced at C2 and C3 then the C5 and C6 screws were removed and laminectomy was undertaken removing C5 lamina in its entirety and undercutting C6 substantially thickened redundant yellow ligament was removed in this region once this was accomplished the screws were placed back in at C5 and C6 prior to doing this though the facets at C5-6 and at C2-3 were completely decorticated autograft from the laminectomies that were obtained it was used and packed into the facet joint capsules at C2-3 and C5-6 bilaterally then a 90 mm precontoured rod was used to connect the screws from C2-C6 together this was done in a neutral construct final radiographs were obtained with fluoroscopy.  The lateral gutters were filled with the remainder of the bone from C2-C6.  At the end care was taken to make sure that hemostasis was good that the common dural tube was well decompressed from C2-C6 and then the cervical dorsal fascia was closed with #1 Vicryl in interrupted fashion 2-0 Vicryl was used in the subcutaneous tissues 3-0 Vicryl subcuticularly and the final subcuticular layer with 4-0 Vicryl and surgical staples were placed in the scalp blood  loss for the procedure was estimated at approximately 100 cc.  The patient was removed from the 3 pin headrest and turned supine in the operating room.

## 2021-08-05 DIAGNOSIS — M4802 Spinal stenosis, cervical region: Secondary | ICD-10-CM | POA: Diagnosis not present

## 2021-08-05 LAB — BASIC METABOLIC PANEL
Anion gap: 10 (ref 5–15)
BUN: 27 mg/dL — ABNORMAL HIGH (ref 8–23)
CO2: 22 mmol/L (ref 22–32)
Calcium: 8.9 mg/dL (ref 8.9–10.3)
Chloride: 102 mmol/L (ref 98–111)
Creatinine, Ser: 1.75 mg/dL — ABNORMAL HIGH (ref 0.61–1.24)
GFR, Estimated: 40 mL/min — ABNORMAL LOW (ref >60)
Glucose, Bld: 196 mg/dL — ABNORMAL HIGH (ref 70–99)
Potassium: 4.7 mmol/L (ref 3.5–5.1)
Sodium: 134 mmol/L — ABNORMAL LOW (ref 135–145)

## 2021-08-05 LAB — CBC
HCT: 31.2 % — ABNORMAL LOW (ref 39.0–52.0)
Hemoglobin: 10.3 g/dL — ABNORMAL LOW (ref 13.0–17.0)
MCH: 27.8 pg (ref 26.0–34.0)
MCHC: 33 g/dL (ref 30.0–36.0)
MCV: 84.3 fL (ref 80.0–100.0)
Platelets: 223 10*3/uL (ref 150–400)
RBC: 3.7 MIL/uL — ABNORMAL LOW (ref 4.22–5.81)
RDW: 14.8 % (ref 11.5–15.5)
WBC: 13 10*3/uL — ABNORMAL HIGH (ref 4.0–10.5)
nRBC: 0 % (ref 0.0–0.2)

## 2021-08-05 NOTE — Evaluation (Signed)
Physical Therapy Evaluation Patient Details Name: Shawn Decker MRN: [id number] DOB: 22-Jun-1945 Today's Date: 08/05/2021  History of Present Illness  76 y/o male presented to [facility] ED on 08/03/21 for increased weakness and balance difficulty along with numbness. 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 S/p posterior cervical decompression C2-6 with posterior fixation C2-7. PMH: HTN, T2DM  Clinical Impression  Patient admitted with the above. PTA, patient was independent and living alone, however the past 2 weeks, patient has required RW for ambulation due to weakness and balance deficits. Patient currently presents with weakness, impaired balance, decreased activity tolerance, and impaired functional mobility. Educated patient on cervical precautions and brace wear, patient verbalized understanding. Patient required maxA+2 to stand from recliner and unable to progress to walking due to bilateral knee buckling with each attempt to extend knees. Patient motivated to get back to independence. Patient will benefit from skilled PT services during acute stay to address listed deficits. Recommend AIR at discharge to maximize functional independence and safety.      Recommendations for follow up therapy are one component of a multi-disciplinary discharge planning process, led by the attending physician.  Recommendations may be updated based on patient status, additional functional criteria and insurance authorization.  Follow Up Recommendations Acute inpatient rehab (3hours/day)    Assistance Recommended at Discharge Frequent or constant Supervision/Assistance  Patient can return home with the following  Two people to help with walking and/or transfers;Two people to help with bathing/dressing/bathroom;Assistance with cooking/housework;Assist for transportation;Help with stairs or ramp for entrance    Equipment Recommendations Other (comment) (TBD)  Recommendations for Other  Services  Rehab consult    Functional Status Assessment Patient has had a recent decline in their functional status and demonstrates the ability to make significant improvements in function in a reasonable and predictable amount of time.     Precautions / Restrictions Precautions Precautions: Cervical Precaution Booklet Issued: No Precaution Comments: verbally reviewed 3/3 cervical precautions Required Braces or Orthoses: Cervical Brace Cervical Brace: Hard collar;At all times Restrictions Weight Bearing Restrictions: No      Mobility  Bed Mobility               General bed mobility comments: in chair on arrival    Transfers Overall transfer level: Needs assistance Equipment used: Rolling Mahira Petras (2 wheels) Transfers: Sit to/from Stand Sit to Stand: Max assist, +2 physical assistance, +2 safety/equipment           General transfer comment: initially able to clear buttocks from chair but due B LE weakness unable to power up into standing without maxA+2 to maintain balance and power up. Difficulty advancing hands to RW with heavy assistance to maintain balance. Once in standing, patient with bilateral knee buckling each attempt to straighten legs. Required maxA+2 to maintain standing    Ambulation/Gait                  Stairs            Wheelchair Mobility    Modified Rankin (Stroke Patients Only)       Balance Overall balance assessment: Needs assistance Sitting-balance support: No upper extremity supported, Feet supported Sitting balance-Leahy Scale: Fair     Standing balance support: Bilateral upper extremity supported, Reliant on assistive device for balance Standing balance-Leahy Scale: Zero Standing balance comment: maxA+2 to maintain standing  Pertinent Vitals/Pain Pain Assessment Pain Assessment: 0-10 Pain Score: 4  Pain Location: neck Pain Descriptors / Indicators: Discomfort, Grimacing,  Sore Pain Intervention(s): Monitored during session, Repositioned, Limited activity within patient's tolerance    Home Living Family/patient expects to be discharged to:: Private residence Living Arrangements: Alone Available Help at Discharge: Family;Friend(s);Available PRN/intermittently Type of Home: House Home Access: Stairs to enter Entrance Stairs-Rails: None Entrance Stairs-Number of Steps: 4 in front, 3 in garage Alternate Level Stairs-Number of Steps: has stairlift Home Layout: Two level Home Equipment: Shower seat - built Medical sales representative (2 wheels);Cane - single point;BSC/3in1      Prior Function Prior Level of Function : Independent/Modified Independent             Mobility Comments: RW use the past 2 weeks, 2 LOB episodes in the past 6 months ADLs Comments: independent with modifications, sponge bathed     Hand Dominance        Extremity/Trunk Assessment   Upper Extremity Assessment Upper Extremity Assessment: Defer to OT evaluation    Lower Extremity Assessment Lower Extremity Assessment: Generalized weakness (grossly 2/5 bilaterally)    Cervical / Trunk Assessment Cervical / Trunk Assessment: Neck Surgery  Communication   Communication: No difficulties  Cognition Arousal/Alertness: Awake/alert Behavior During Therapy: WFL for tasks assessed/performed Overall Cognitive Status: Within Functional Limits for tasks assessed                                          General Comments      Exercises     Assessment/Plan    PT Assessment Patient needs continued PT services  PT Problem List Decreased strength;Decreased activity tolerance;Decreased mobility;Decreased balance;Decreased coordination;Decreased knowledge of use of DME;Decreased safety awareness;Decreased knowledge of precautions       PT Treatment Interventions DME instruction;Gait training;Functional mobility training;Therapeutic activities;Therapeutic exercise;Balance  training;Patient/family education    PT Goals (Current goals can be found in the Care Plan section)  Acute Rehab PT Goals Patient Stated Goal: to get stronger PT Goal Formulation: With patient Time For Goal Achievement: 08/19/21 Potential to Achieve Goals: Good    Frequency Min 5X/week     Co-evaluation PT/OT/SLP Co-Evaluation/Treatment: Yes Reason for Co-Treatment: For patient/therapist safety;To address functional/ADL transfers PT goals addressed during session: Mobility/safety with mobility;Balance;Proper use of DME         AM-PAC PT "6 Clicks" Mobility  Outcome Measure Help needed turning from your back to your side while in a flat bed without using bedrails?: Total Help needed moving from lying on your back to sitting on the side of a flat bed without using bedrails?: Total Help needed moving to and from a bed to a chair (including a wheelchair)?: Total Help needed standing up from a chair using your arms (e.g., wheelchair or bedside chair)?: Total Help needed to walk in hospital room?: Total Help needed climbing 3-5 steps with a railing? : Total 6 Click Score: 6    End of Session Equipment Utilized During Treatment: Gait belt;Cervical collar Activity Tolerance: Patient tolerated treatment well Patient left: in chair;with call bell/phone within reach Nurse Communication: Mobility status PT Visit Diagnosis: Unsteadiness on feet (R26.81);Muscle weakness (generalized) (M62.81);Other abnormalities of gait and mobility (R26.89)    Time: [id number] PT Time Calculation (min) (ACUTE ONLY): 21 min   Charges:   PT Evaluation $PT Eval Moderate Complexity: 1 Mod  Delorean Knutzen A. Dan Humphreys PT, DPT Acute Rehabilitation Services Office [phone]   Viviann Spare 08/05/2021, 12:09 PM

## 2021-08-05 NOTE — Progress Notes (Signed)
PROGRESS NOTE    Shawn Decker  ZWC:[id number] DOB: Nov 13, 1945 DOA: 08/03/2021 PCP: [facility]     Brief Narrative:  Shawn Decker is a 76 y.o. male with medical history significant of HTN, HLD, and DM presenting with numbness/weakness/tingling.  He reports having a long history of peripheral neuropathy.  He also remotely had C2-3 and 3-4 fusions.  About 2 weeks ago, he noticed tingling in his fingers, symmetric.  He also noticed around the same time worsening tingling/numbness in his feet.  It has progressed to weakness.  Three weeks ago, he was able to ambulate independently; now he can barely get around with a walker.  He is unable to lift his arms above his chest.  His legs are increasingly weak.  No bowel/bladder incontinence. MRI showed severe cervical stenosis with cord signal abnormality. Dr. Danielle Dess of neurosurgery consulted.  He underwent cervical laminectomy with decompression on 6/17.  New events last 24 hours / Subjective: Patient sitting in bed finishing breakfast.  Strength of his lower extremities and upper extremities is slightly improved.  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  Follow-up with Dr. Danielle Dess in 10-14 days for staple removal and wound check as outpatient -PT OT evaluation pending  Hypertension -Amiloride, amlodipine, labetalol -Losartan on hold due to AKI  AKI on CKD stage IIIa -Baseline creatinine 1.4 -Continue IV fluids today  Hyperlipidemia -Atorvastatin    DVT prophylaxis:  SCD's Start: 08/04/21 1640 SCDs Start:  08/04/21 0829  Code Status: DNR Family Communication: None at bedside  Disposition Plan:  Status is: Inpatient Remains inpatient appropriate because: Need PT OT  Antimicrobials:  Anti-infectives (From admission, onward)    Start     Dose/Rate Route Frequency Ordered Stop   08/04/21 2000  ceFAZolin (ANCEF) IVPB 2g/100 mL premix        2 g 200 mL/hr over 30 Minutes Intravenous Every 8 hours 08/04/21 1639 08/05/21 0502   08/04/21 1043  ceFAZolin (ANCEF) 2-4 GM/100ML-% IVPB       Note to Pharmacy: Evern Bio K: cabinet override      08/04/21 1043 08/04/21 2259        Objective: Vitals:   08/04/21 1633 08/04/21 2053 08/05/21 0558 08/05/21 0747  BP: (!) 161/81 (!) 142/65 (!) 153/69 139/66  Pulse: 80 79 79 68  Resp: 17 18 18 18   Temp: 98 F (36.7 C) 98.4 F (36.9 C) 98.2 F (36.8 C) 97.9 F (36.6 C)  TempSrc: Oral Oral Oral Oral  SpO2: 98% 98% 99% 97%  Weight:      Height:        Intake/Output Summary (Last 24 hours) at 08/05/2021 1049 Last data filed at 08/05/2021 0600 Gross per 24 hour  Intake 1646.68 ml  Output 2075 ml  Net -428.32 ml   Filed Weights   08/03/21 1328 08/04/21 1042  Weight: 81.6 kg 81.6 kg    Examination:  General exam: Appears calm and comfortable, cervical collar in place Respiratory system: Clear to auscultation. Respiratory effort normal. No respiratory distress. No conversational dyspnea.  Cardiovascular system: S1 & S2 heard, RRR. No murmurs. No pedal  edema. Gastrointestinal system: Abdomen is nondistended, soft and nontender. Normal bowel sounds heard. Central nervous system: Alert and oriented.  Speech is clear.  Bilateral lower extremity strength 3 out of 5, bilateral upper extremity strength 5 out of 5 Extremities: Symmetric in appearance  Skin: No rashes, lesions or ulcers on exposed skin  Psychiatry: Judgement and insight appear normal. Mood & affect appropriate.   Data Reviewed: I have personally reviewed following labs and  imaging studies  CBC: Recent Labs  Lab 08/03/21 1605 08/05/21 0246  WBC 6.0 13.0*  NEUTROABS 3.7  --   HGB 10.9* 10.3*  HCT 33.3* 31.2*  MCV 85.6 84.3  PLT 234 [id number]   Basic Metabolic Panel: Recent Labs  Lab 08/03/21 1605 08/05/21 0246  NA 138 134*  K 3.8 4.7  CL 108 102  CO2 24 22  GLUCOSE 104* 196*  BUN 22 27*  CREATININE 1.59* 1.75*  CALCIUM 9.1 8.9  MG 1.4*  --    GFR: Estimated Creatinine Clearance: 36.5 mL/min (A) (by C-G formula based on SCr of 1.75 mg/dL (H)). Liver Function Tests: No results for input(s): "AST", "ALT", "ALKPHOS", "BILITOT", "PROT", "ALBUMIN" in the last 168 hours. No results for input(s): "LIPASE", "AMYLASE" in the last 168 hours. No results for input(s): "AMMONIA" in the last 168 hours. Coagulation Profile: No results for input(s): "INR", "PROTIME" in the last 168 hours. Cardiac Enzymes: No results for input(s): "CKTOTAL", "CKMB", "CKMBINDEX", "TROPONINI" in the last 168 hours. BNP (last 3 results) No results for input(s): "PROBNP" in the last 8760 hours. HbA1C: No results for input(s): "HGBA1C" in the last 72 hours. CBG: Recent Labs  Lab 08/04/21 1045  GLUCAP 148*   Lipid Profile: No results for input(s): "CHOL", "HDL", "LDLCALC", "TRIG", "CHOLHDL", "LDLDIRECT" in the last 72 hours. Thyroid Function Tests: No results for input(s): "TSH", "T4TOTAL", "FREET4", "T3FREE", "THYROIDAB" in the last 72 hours. Anemia Panel: No results for input(s): "VITAMINB12", "FOLATE", "FERRITIN", "TIBC", "IRON", "RETICCTPCT" in the last 72 hours. Sepsis Labs: No results for input(s): "PROCALCITON", "LATICACIDVEN" in the last 168 hours.  Recent Results (from the past 240 hour(s))  Surgical PCR screen     Status: None   Collection Time: 08/04/21  9:43 AM   Specimen: Nasal Mucosa; Nasal Swab  Result Value Ref Range Status   MRSA, PCR NEGATIVE NEGATIVE Final   Staphylococcus aureus NEGATIVE NEGATIVE Final    Comment: (NOTE) The Xpert SA Assay (FDA  approved for NASAL specimens in patients 40 years of age and older), is one component of a comprehensive surveillance program. It is not intended to diagnose infection nor to guide or monitor treatment. Performed at [facility], [place] [address] [place], [place] [address]       Radiology Studies: DG Cervical Spine 1 View  Result Date: 08/04/2021 CLINICAL DATA:  Posterior cervical fusion EXAM: DG CERVICAL SPINE - 1 VIEW COMPARISON:  CT cervical spine dated September 21, 2017 FINDINGS: Fluoroscopic images were obtained intraoperatively and submitted for post operative interpretation. Posterior cervical fusion of C2-C6 with hardware in expected position, 1 image was obtained with 14 seconds of fluoroscopy time and 2.22 mGy. Prior ACDF of C3-C5. Please see the performing provider's procedural report for further detail. IMPRESSION: Fluoroscopic images demonstrate posterior cervical fusion of C2-C6. Electronically Signed   By: Yetta Glassman M.D.   On: 08/04/2021 17:05   DG C-Arm 1-60 Min-No Report  Result Date: 08/04/2021 Fluoroscopy was utilized by the requesting physician.  No radiographic interpretation.   DG C-Arm  1-60 Min-No Report  Result Date: 08/04/2021 Fluoroscopy was utilized by the requesting physician.  No radiographic interpretation.   DG C-Arm 1-60 Min-No Report  Result Date: 08/04/2021 Fluoroscopy was utilized by the requesting physician.  No radiographic interpretation.   MR Cervical Spine Wo Contrast  Addendum Date: 08/04/2021   ADDENDUM REPORT: 08/04/2021 04:54 ADDENDUM: Study discussed by telephone with Dr. Nicanor Alcon on 08/04/2021 at 0450 hours. Electronically Signed   By: Odessa Fleming M.D.   On: 08/04/2021 04:54   Result Date: 08/04/2021 CLINICAL DATA:  76 year old male with weakness. Two weeks of extremity numbness. Progressive lower extremity weakness. Progressive EXAM: MRI CERVICAL SPINE WITHOUT CONTRAST TECHNIQUE: Multiplanar, multisequence MR imaging of the cervical  spine was performed. No intravenous contrast was administered. COMPARISON:  Brain MRI today reported separately. Cervical spine CT 09/21/2017. FINDINGS: Alignment: Chronic straightening of cervical lordosis. No significant spondylolisthesis. Vertebrae: Chronic C3-C4 and C4-C5 ACDF. Mild hardware artifact at those levels. No marrow edema or evidence of acute osseous abnormality. Background bone marrow signal within normal limits. Cord: Abnormal. Moderate to severe spinal stenosis at C2-C3 is associated with abnormal cord signal on series 7, image 8. and there is additional abnormal cord signal at the C4 cervical level, suggesting myelomalacia despite absence of stenosis (series 8, image 20). And moderate to severe spinal stenosis at C5-C6 also with evidence of abnormal cord signal just caudal to the cord mass effect there (series 8, image 26 and series 5, image 8). Visible upper thoracic canal and spinal cord appear more normal. Posterior Fossa, vertebral arteries, paraspinal tissues: Cervicomedullary junction is within normal limits. Brain is detailed separately. Preserved major vascular flow voids in the neck. The left vertebral artery appears mildly dominant. Negative visible neck soft tissues and lung apices. Disc levels: C2-C3: Bulky posterior disc bulge or protrusion superimposed on severe facet and ligament flavum hypertrophy. Moderate to severe spinal stenosis and spinal cord mass effect. Abnormal cord signal as above. Mild left but moderate to severe right C3 foraminal stenosis. C3-C4: Prior ACDF. Questionable pseudoarthrosis here on the 2019 CT, although suggestion now of facet ankylosis on the right. No spinal stenosis. Residual moderate to severe bilateral C4 foraminal stenosis. C4-C5: Prior ACDF. Solid arthrodesis here in 2019. No stenosis, although abnormal cord signal as detailed above. C5-C6: Bulky posterior disc bulge or protrusion and moderate to severe facet and ligament flavum hypertrophy.  Moderate to severe spinal stenosis (series 8, image 24) with severe left and moderate to severe right C6 foraminal stenosis. C6-C7: Circumferential disc bulge with a broad-based posterior component. Moderate ligament flavum and mild to moderate facet hypertrophy. Borderline spinal stenosis. Mild to moderate left C7 foraminal stenosis. C7-T1: Lobulated posterior disc bulge or protrusion eccentric to the right. Moderate facet and ligament flavum hypertrophy. Borderline to mild spinal stenosis. Mild left and mild to moderate right C8 foraminal stenosis. IMPRESSION: 1. Previous C3-C4 and C4-C5 ACDF with Severe adjacent segment disease, spinal stenosis, spinal cord mass effect, AND abnormal cord signal at both C2-C3 and C5-C6 adjacent segments. This could reflect a combination of cord edema and myelomalacia. 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 Recommend Neurosurgery consultation. 2. Associated moderate to severe bilateral C4 and C6 foraminal stenosis. And borderline to mild spinal stenosis at C6-C7 and C7-T1 with up to moderate C7 and C8 foraminal stenosis. Electronically Signed: By: Odessa Fleming M.D. On: 08/04/2021 04:46   MR BRAIN WO CONTRAST  Result Date: 08/04/2021 CLINICAL DATA:  76 year old male with weakness. Two weeks of extremity  numbness. Progressive lower extremity weakness. Progressive EXAM: MRI HEAD WITHOUT CONTRAST TECHNIQUE: Multiplanar, multiecho pulse sequences of the brain and surrounding structures were obtained without intravenous contrast. COMPARISON:  Cervical spine MRI today reported separately. FINDINGS: Brain: No restricted diffusion to suggest acute infarction. No midline shift, mass effect, evidence of mass lesion, ventriculomegaly, extra-axial collection or acute intracranial hemorrhage. Cervicomedullary junction and pituitary are within normal limits. Cerebral volume is within normal limits for age. Pearline Cables and white matter signal is within normal limits for age  throughout the brain. No cortical encephalomalacia or chronic cerebral blood products identified. Vascular: Major intracranial vascular flow voids are preserved. Skull and upper cervical spine: Cervical spine detailed separately. Visualized bone marrow signal is within normal limits. Sinuses/Orbits: Postoperative changes to both globes. Paranasal sinuses and mastoids are well aerated. Other: Negative visible scalp and face. IMPRESSION: Normal for age noncontrast MRI appearance of the Brain. Electronically Signed   By: Genevie Ann M.D.   On: 08/04/2021 04:38      Scheduled Meds:  [START ON 08/06/2021] aMILoride  5 mg Oral Q M,W,F   amLODipine  10 mg Oral Daily   atorvastatin  20 mg Oral Daily   dexamethasone (DECADRON) injection  4 mg Intravenous Q6H   docusate sodium  100 mg Oral BID   gabapentin  300 mg Oral BID   labetalol  300 mg Oral BID   leflunomide  20 mg Oral Daily   magnesium oxide  400 mg Oral Daily   mupirocin ointment  1 Application Nasal BID   senna  1 tablet Oral BID   sodium chloride flush  3 mL Intravenous Q12H   tamsulosin  0.4 mg Oral Daily   Continuous Infusions:  sodium chloride     lactated ringers 75 mL/hr at 08/04/21 1644   methocarbamol (ROBAXIN) IV       LOS: 1 day     Dessa Phi, DO [facility] 08/05/2021, 10:49 AM   Available via Epic secure chat 7am-7pm After these hours, please refer to coverage provider listed on [email or web address]

## 2021-08-05 NOTE — Progress Notes (Signed)
Patient ID: Shawn Decker, male   DOB: 1945-09-20, 76 y.o.   MRN: [id number] Postop day 1 patient is awake alert and feeling reasonably well Modest pain issues in the back of neck Motor function appears stable perhaps slightly improved in the upper extremities I have not tested his gait yet I believe he can be mobilized with physical therapy and Occupational Therapy should also see him. I would advise a rehabilitation medicine consult to see if he is a candidate for comprehensive inpatient rehabilitation if not he will likely need home health physical therapy and Occupational Therapy to see him The hard cervical collar will need to be worn for the first several weeks after surgery.  I plan on seeing him in about 10 days to 2 weeks for staple removal and a wound check as an outpatient.  He can be mobilized and discharged after physical therapy and rehabilitation have evaluated him.

## 2021-08-05 NOTE — Evaluation (Signed)
Occupational Therapy Evaluation Patient Details Name: Shawn Decker MRN: [id number] DOB: [birth year] Today's Date: 08/05/2021   History of Present Illness 76 y/o male presented to [facility] ED on 08/03/21 for increased weakness and balance difficulty along with numbness. 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 S/p posterior cervical decompression C2-6 with posterior fixation C2-7. PMH: HTN, T2DM, ACDF   Clinical Impression   Pt lives alone, has family nearby to assist. PTA, pt has recently began using RW for mobility due to progressive weakness. Pt states he has been independent with ADLs, however it has taken increased time, and he reports sponge bathing for safety. Pt currently needing min-max A for ADLs, and max A +2 for sit to stand transfer from chair. Pt with BUE weakness and bil knee buckling, unable to achieve upright posture standing with RW. Pt educated on 3/3 cervical precautions, pt verbalized understanding. Pt presenting with impairments listed below, will follow acutely. Recommend AIR at d/c.     Recommendations for follow up therapy are one component of a multi-disciplinary discharge planning process, led by the attending physician.  Recommendations may be updated based on patient status, additional functional criteria and insurance authorization.   Follow Up Recommendations  Acute inpatient rehab (3hours/day)    Assistance Recommended at Discharge Intermittent Supervision/Assistance  Patient can return home with the following Two people to help with walking and/or transfers;A lot of help with bathing/dressing/bathroom;Assistance with cooking/housework;Assist for transportation;Help with stairs or ramp for entrance    Functional Status Assessment  Patient has had a recent decline in their functional status and demonstrates the ability to make significant improvements in function in a reasonable and predictable amount of time.  Equipment Recommendations  None  recommended by OT;Other (comment) (defer to next venue of care)    Recommendations for Other Services PT consult;Rehab consult     Precautions / Restrictions Precautions Precautions: Cervical Precaution Booklet Issued: No Precaution Comments: verbally reviewed 3/3 cervical precautions Required Braces or Orthoses: Cervical Brace Cervical Brace: Hard collar;At all times Restrictions Weight Bearing Restrictions: No      Mobility Bed Mobility               General bed mobility comments: in chair on arrival    Transfers Overall transfer level: Needs assistance Equipment used: Rolling walker (2 wheels) Transfers: Sit to/from Stand Sit to Stand: Max assist, +2 physical assistance, +2 safety/equipment                  Balance Overall balance assessment: Needs assistance Sitting-balance support: No upper extremity supported, Feet supported Sitting balance-Leahy Scale: Fair     Standing balance support: Bilateral upper extremity supported, Reliant on assistive device for balance Standing balance-Leahy Scale: Zero Standing balance comment: maxA+2 to maintain standing                           ADL either performed or assessed with clinical judgement   ADL Overall ADL's : Needs assistance/impaired Eating/Feeding: Minimal assistance;Sitting   Grooming: Minimal assistance;Sitting   Upper Body Bathing: Sitting;Moderate assistance   Lower Body Bathing: Maximal assistance;Sitting/lateral leans   Upper Body Dressing : Minimal assistance;Sitting   Lower Body Dressing: Maximal assistance;Sitting/lateral leans;Sit to/from stand   Toilet Transfer: Maximal assistance;+2 for physical assistance   Toileting- Clothing Manipulation and Hygiene: Moderate assistance;Sitting/lateral lean       Functional mobility during ADLs: Maximal assistance;+2 for physical assistance;Rolling walker (2 wheels)  Vision   Vision Assessment?: No apparent visual deficits      Perception     Praxis      Pertinent Vitals/Pain Pain Assessment Pain Assessment: Faces Pain Score: 4  Faces Pain Scale: Hurts little more Pain Location: neck Pain Descriptors / Indicators: Discomfort, Grimacing, Sore Pain Intervention(s): Limited activity within patient's tolerance, Monitored during session, Premedicated before session     Hand Dominance     Extremity/Trunk Assessment Upper Extremity Assessment Upper Extremity Assessment: Generalized weakness   Lower Extremity Assessment Lower Extremity Assessment: Defer to PT evaluation   Cervical / Trunk Assessment Cervical / Trunk Assessment: Neck Surgery   Communication Communication Communication: No difficulties   Cognition Arousal/Alertness: Awake/alert Behavior During Therapy: WFL for tasks assessed/performed Overall Cognitive Status: Within Functional Limits for tasks assessed                                       General Comments  VSS on RA    Exercises     Shoulder Instructions      Home Living Family/patient expects to be discharged to:: Private residence Living Arrangements: Alone Available Help at Discharge: Family;Friend(s);Available PRN/intermittently Type of Home: House Home Access: Stairs to enter [employer] of Steps: 4 in front, 3 in garage Entrance Stairs-Rails: None Home Layout: Two level Alternate Level Stairs-Number of Steps: has stairlift   Bathroom Shower/Tub: Walk-in shower         Home Equipment: Shower seat - built [employer] fundraiser (2 wheels);Cane - single point;BSC/3in1          Prior Functioning/Environment Prior Level of Function : Independent/Modified Independent             Mobility Comments: RW use the past 2 weeks, 2 LOB episodes in the past 6 months ADLs Comments: independent with modifications, sponge bathed        OT Problem List: Decreased strength;Decreased range of motion;Decreased activity tolerance;Impaired  balance (sitting and/or standing)      OT Treatment/Interventions: Self-care/ADL training;Therapeutic exercise;Therapeutic activities;Patient/family education;Balance training;Energy conservation;DME and/or AE instruction    OT Goals(Current goals can be found in the care plan section) Acute Rehab OT Goals Patient Stated Goal: to get better OT Goal Formulation: With patient Time For Goal Achievement: 08/19/21 Potential to Achieve Goals: Good ADL Goals Pt Will Perform Upper Body Dressing: with supervision;sitting Pt Will Perform Lower Body Dressing: with min assist;with adaptive equipment;sitting/lateral leans;sit to/from stand;bed level Pt Will Transfer to Toilet: with mod assist;with +2 assist;bedside commode;stand pivot transfer;squat pivot transfer Additional ADL Goal #1: Pt will demo log rolling technique appropriately in order to adhere to back precautions for ADLs  OT Frequency: Min 3X/week    Co-evaluation PT/OT/SLP Co-Evaluation/Treatment: Yes Reason for Co-Treatment: For patient/therapist safety;To address functional/ADL transfers PT goals addressed during session: Mobility/safety with mobility;Balance;Proper use of DME OT goals addressed during session: ADL's and self-care;Strengthening/ROM      AM-PAC OT "6 Clicks" Daily Activity     Outcome Measure Help from another person eating meals?: A Little Help from another person taking care of personal grooming?: A Little Help from another person toileting, which includes using toliet, bedpan, or urinal?: A Lot Help from another person bathing (including washing, rinsing, drying)?: A Lot Help from another person to put on and taking off regular upper body clothing?: A Little Help from another person to put on and taking off regular lower body clothing?: A Lot 6  Click Score: 15   End of Session Equipment Utilized During Treatment: Gait belt;Rolling walker (2 wheels);Cervical collar Nurse Communication: Mobility  status  Activity Tolerance: Patient tolerated treatment well Patient left: in chair;with call bell/phone within reach  OT Visit Diagnosis: Unsteadiness on feet (R26.81);Other abnormalities of gait and mobility (R26.89);Muscle weakness (generalized) (M62.81)                Time: [id number] OT Time Calculation (min): 21 min Charges:  OT General Charges $OT Visit: 1 Visit OT Evaluation $OT Eval Moderate Complexity: 1 [address] OTD, OTR/L Acute Rehab [phone]) [phone] - [place] 08/05/2021, 12:40 PM

## 2021-08-06 ENCOUNTER — Encounter (HOSPITAL_COMMUNITY): Payer: Self-pay | Admitting: Neurological Surgery

## 2021-08-06 DIAGNOSIS — M4802 Spinal stenosis, cervical region: Secondary | ICD-10-CM | POA: Diagnosis not present

## 2021-08-06 LAB — BASIC METABOLIC PANEL
Anion gap: 7 (ref 5–15)
BUN: 28 mg/dL — ABNORMAL HIGH (ref 8–23)
CO2: 24 mmol/L (ref 22–32)
Calcium: 8.5 mg/dL — ABNORMAL LOW (ref 8.9–10.3)
Chloride: 105 mmol/L (ref 98–111)
Creatinine, Ser: 1.28 mg/dL — ABNORMAL HIGH (ref 0.61–1.24)
GFR, Estimated: 58 mL/min — ABNORMAL LOW (ref >60)
Glucose, Bld: 115 mg/dL — ABNORMAL HIGH (ref 70–99)
Potassium: 4.5 mmol/L (ref 3.5–5.1)
Sodium: 136 mmol/L (ref 135–145)

## 2021-08-06 LAB — CBC
HCT: 28.2 % — ABNORMAL LOW (ref 39.0–52.0)
Hemoglobin: 9.3 g/dL — ABNORMAL LOW (ref 13.0–17.0)
MCH: 27.9 pg (ref 26.0–34.0)
MCHC: 33 g/dL (ref 30.0–36.0)
MCV: 84.7 fL (ref 80.0–100.0)
Platelets: 201 10*3/uL (ref 150–400)
RBC: 3.33 MIL/uL — ABNORMAL LOW (ref 4.22–5.81)
RDW: 14.8 % (ref 11.5–15.5)
WBC: 12.6 10*3/uL — ABNORMAL HIGH (ref 4.0–10.5)
nRBC: 0 % (ref 0.0–0.2)

## 2021-08-06 MED FILL — Thrombin For Soln 5000 Unit: CUTANEOUS | Qty: 5000 | Status: AC

## 2021-08-06 NOTE — Progress Notes (Signed)
Inpatient Rehab Admissions Coordinator:    I met with pt. To discuss potential CIR admit. He is interested, thinks his son can come stay with him. I will reach out to his son to confirm and open a case with his insurance for CIR.   Clemens Catholic, [place], [place] Admissions Coordinator  [phone] ([place]) [phone] (office)

## 2021-08-06 NOTE — Progress Notes (Signed)
Physical Therapy Treatment Patient Details Name: Shawn Decker MRN: [id number] DOB: [birth year] Today's Date: 08/06/2021   History of Present Illness 76 y/o male presented to [facility] ED on 08/03/21 for increased weakness and balance difficulty along with numbness. 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 S/p posterior cervical decompression C2-6 with posterior fixation C2-7. PMH: HTN, T2DM    PT Comments    Pt progressing towards physical therapy goals. Utilized Antony Salmon for OOB mobility this session based on performance on eval. Feel he is ready to progress to gait training next session. AIR remains the most appropriate d/c disposition at this time to maximize functional independence and safety, and return to PLOF.    Recommendations for follow up therapy are one component of a multi-disciplinary discharge planning process, led by the attending physician.  Recommendations may be updated based on patient status, additional functional criteria and insurance authorization.  Follow Up Recommendations  Acute inpatient rehab (3hours/day)     Assistance Recommended at Discharge Frequent or constant Supervision/Assistance  Patient can return home with the following Two people to help with walking and/or transfers;Two people to help with bathing/dressing/bathroom;Assistance with cooking/housework;Assist for transportation;Help with stairs or ramp for entrance   Equipment Recommendations  Other (comment) (TBD)    Recommendations for Other Services Rehab consult     Precautions / Restrictions Precautions Precautions: Cervical Precaution Booklet Issued: No Precaution Comments: verbally reviewed 3/3 cervical precautions Required Braces or Orthoses: Cervical Brace Cervical Brace: Hard collar;At all times Restrictions Weight Bearing Restrictions: No     Mobility  Bed Mobility Overal bed mobility: Needs Assistance Bed Mobility: Rolling, Sidelying to Sit Rolling: Mod  assist Sidelying to sit: Mod assist       General bed mobility comments: VC's for log roll technique. Hand-over-hand assist to reach for the L railing with the RUE. Bed pad utilized to assist with full roll and to help scoot LE's out fully so feet were resting on floor.    Transfers Overall transfer level: Needs assistance Equipment used: Ambulation equipment used Transfers: Sit to/from Stand, Bed to chair/wheelchair/BSC Sit to Stand: Mod assist, +2 safety/equipment           General transfer comment: Stedy utilized for OOB this session. Pt was able to utilize center bar of Stedy to assist himself into standing. Increased time to achieve full stand and tactile cues required for terminal knee extension bilaterally. Transfer via Lift Equipment: Stedy  Ambulation/Gait               General Gait Details: Did not progress to gait training this session.   Stairs             Wheelchair Mobility    Modified Rankin (Stroke Patients Only)       Balance Overall balance assessment: Needs assistance Sitting-balance support: No upper extremity supported, Feet supported Sitting balance-Leahy Scale: Fair     Standing balance support: Bilateral upper extremity supported, Reliant on assistive device for balance Standing balance-Leahy Scale: Zero Standing balance comment: maxA+2 to maintain standing                            Cognition Arousal/Alertness: Awake/alert Behavior During Therapy: WFL for tasks assessed/performed Overall Cognitive Status: Within Functional Limits for tasks assessed  Exercises General Exercises - Lower Extremity Mini-Sqauts: 20 reps (2x10 reps from [place])    General Comments        Pertinent Vitals/Pain Pain Assessment Pain Assessment: Faces Faces Pain Scale: Hurts little more Pain Location: neck Pain Descriptors / Indicators: Discomfort, Grimacing, Sore Pain  Intervention(s): Limited activity within patient's tolerance, Monitored during session, Repositioned    Home Living                          Prior Function            PT Goals (current goals can now be found in the care plan section) Acute Rehab PT Goals Patient Stated Goal: to get stronger PT Goal Formulation: With patient Time For Goal Achievement: 08/19/21 Potential to Achieve Goals: Good Progress towards PT goals: Progressing toward goals    Frequency    Min 5X/week      PT Plan Current plan remains appropriate    Co-evaluation              AM-PAC PT "6 Clicks" Mobility   Outcome Measure  Help needed turning from your back to your side while in a flat bed without using bedrails?: A Lot Help needed moving from lying on your back to sitting on the side of a flat bed without using bedrails?: A Lot Help needed moving to and from a bed to a chair (including a wheelchair)?: Total Help needed standing up from a chair using your arms (e.g., wheelchair or bedside chair)?: A Lot Help needed to walk in hospital room?: Total Help needed climbing 3-5 steps with a railing? : Total 6 Click Score: 9    End of Session Equipment Utilized During Treatment: Gait belt;Cervical collar Activity Tolerance: Patient tolerated treatment well Patient left: in chair;with call bell/phone within reach Nurse Communication: Mobility status PT Visit Diagnosis: Unsteadiness on feet (R26.81);Muscle weakness (generalized) (M62.81);Other abnormalities of gait and mobility (R26.89)     Time: 7673-4193 PT Time Calculation (min) (ACUTE ONLY): 26 min  Charges:  $Gait Training: 23-37 mins                     Conni Slipper, PT, DPT Acute Rehabilitation Services Secure Chat Preferred Office: [phone]    Marylynn Pearson 08/06/2021, 1:35 PM

## 2021-08-06 NOTE — Progress Notes (Signed)
PROGRESS NOTE    Yanis Larin  YQM:[id number] DOB: 19-May-1945 DOA: 08/03/2021 PCP: [facility]     Brief Narrative:  Beckem Tomberlin is a 76 y.o. male with medical history significant of HTN, HLD, and DM presenting with numbness/weakness/tingling.  He reports having a long history of peripheral neuropathy.  He also remotely had C2-3 and 3-4 fusions.  About 2 weeks ago, he noticed tingling in his fingers, symmetric.  He also noticed around the same time worsening tingling/numbness in his feet.  It has progressed to weakness.  Three weeks ago, he was able to ambulate independently; now he can barely get around with a walker.  He is unable to lift his arms above his chest.  His legs are increasingly weak.  No bowel/bladder incontinence. MRI showed severe cervical stenosis with cord signal abnormality. Dr. Danielle Dess of neurosurgery consulted.  He underwent cervical laminectomy with decompression on 6/17.  New events last 24 hours / Subjective: Patient sitting in bed eating breakfast.  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  Follow-up with Dr. Danielle Dess in 10-14 days for staple removal and wound check as outpatient -PT OT evaluation recommending CIR  Hypertension -Amiloride, amlodipine, labetalol -Losartan on hold due to AKI.  Likely can resume in a few days  AKI on CKD stage IIIa -Baseline creatinine 1.4 -Resolved  Hyperlipidemia -Atorvastatin    DVT prophylaxis:  SCD's Start: 08/04/21 1640 SCDs Start: 08/04/21 0829  Code Status: DNR Family Communication: None at  bedside  Disposition Plan:  Status is: Inpatient Remains inpatient appropriate because: CIR placement pending  Antimicrobials:  Anti-infectives (From admission, onward)    Start     Dose/Rate Route Frequency Ordered Stop   08/04/21 2000  ceFAZolin (ANCEF) IVPB 2g/100 mL premix        2 g 200 mL/hr over 30 Minutes Intravenous Every 8 hours 08/04/21 1639 08/06/21 0925   08/04/21 1043  ceFAZolin (ANCEF) 2-4 GM/100ML-% IVPB       Note to Pharmacy: Evern Bio K: cabinet override      08/04/21 1043 08/04/21 2259        Objective: Vitals:   08/05/21 1531 08/05/21 1935 08/06/21 0456 08/06/21 0818  BP: (!) 150/66 (!) 133/56 (!) 145/72 (!) 141/72  Pulse: 84 91 82 71  Resp: 18 16 15 20   Temp: 98.2 F (36.8 C) 99 F (37.2 C) 98.8 F (37.1 C) 98.2 F (36.8 C)  TempSrc: Oral Oral Oral Oral  SpO2: 97% 98% 99% 100%  Weight:      Height:        Intake/Output Summary (Last 24 hours) at 08/06/2021 1156 Last data filed at 08/06/2021 0502 Gross per 24 hour  Intake 464.34 ml  Output 1550 ml  Net -1085.66 ml    Filed Weights   08/03/21 1328 08/04/21 1042  Weight: 81.6 kg 81.6 kg    Examination:  General exam: Appears calm and comfortable, cervical collar in place Central nervous system: Alert and oriented.  Speech is clear.  Bilateral lower extremity strength 4-5 out of 5, bilateral upper extremity strength 5 out of 5 Extremities: Symmetric in appearance  Psychiatry: Judgement and insight appear normal. Mood & affect appropriate.   Data Reviewed: I have personally reviewed following labs and imaging studies  CBC: Recent Labs  Lab 08/03/21 1605 08/05/21 0246 08/06/21 0316  WBC 6.0 13.0* 12.6*  NEUTROABS 3.7  --   --   HGB 10.9* 10.3* 9.3*  HCT 33.3* 31.2* 28.2*  MCV 85.6 84.3 84.7  PLT 234 223 [id number]    Basic Metabolic Panel: Recent Labs  Lab 08/03/21 1605 08/05/21 0246 08/06/21 0316  NA 138 134* 136  K 3.8 4.7 4.5  CL 108 102 105  CO2 24 22 24   GLUCOSE 104*  196* 115*  BUN 22 27* 28*  CREATININE 1.59* 1.75* 1.28*  CALCIUM 9.1 8.9 8.5*  MG 1.4*  --   --     GFR: Estimated Creatinine Clearance: 49.9 mL/min (A) (by C-G formula based on SCr of 1.28 mg/dL (H)). Liver Function Tests: No results for input(s): "AST", "ALT", "ALKPHOS", "BILITOT", "PROT", "ALBUMIN" in the last 168 hours. No results for input(s): "LIPASE", "AMYLASE" in the last 168 hours. No results for input(s): "AMMONIA" in the last 168 hours. Coagulation Profile: No results for input(s): "INR", "PROTIME" in the last 168 hours. Cardiac Enzymes: No results for input(s): "CKTOTAL", "CKMB", "CKMBINDEX", "TROPONINI" in the last 168 hours. BNP (last 3 results) No results for input(s): "PROBNP" in the last 8760 hours. HbA1C: No results for input(s): "HGBA1C" in the last 72 hours. CBG: Recent Labs  Lab 08/04/21 1045  GLUCAP 148*    Lipid Profile: No results for input(s): "CHOL", "HDL", "LDLCALC", "TRIG", "CHOLHDL", "LDLDIRECT" in the last 72 hours. Thyroid Function Tests: No results for input(s): "TSH", "T4TOTAL", "FREET4", "T3FREE", "THYROIDAB" in the last 72 hours. Anemia Panel: No results for input(s): "VITAMINB12", "FOLATE", "FERRITIN", "TIBC", "IRON", "RETICCTPCT" in the last 72 hours. Sepsis Labs: No results for input(s): "PROCALCITON", "LATICACIDVEN" in the last 168 hours.  Recent Results (from the past 240 hour(s))  Surgical PCR screen     Status: None   Collection Time: 08/04/21  9:43 AM   Specimen: Nasal Mucosa; Nasal Swab  Result Value Ref Range Status   MRSA, PCR NEGATIVE NEGATIVE Final   Staphylococcus aureus NEGATIVE NEGATIVE Final    Comment: (NOTE) The Xpert SA Assay (FDA approved for NASAL specimens in patients 53 years of age and older), is one component of a comprehensive surveillance program. It is not intended to diagnose infection nor to guide or monitor treatment. Performed at [facility], [place] [address], [place], [place] [address]        Radiology Studies: DG Cervical Spine 1 View  Result Date: 08/04/2021 CLINICAL DATA:  Posterior cervical fusion EXAM: DG CERVICAL SPINE - 1 VIEW COMPARISON:  CT cervical spine dated September 21, 2017 FINDINGS: Fluoroscopic images were obtained intraoperatively and submitted for post operative interpretation. Posterior cervical fusion of C2-C6 with hardware in expected position, 1 image was obtained with 14 seconds of fluoroscopy time and 2.22 mGy. Prior ACDF of C3-C5. Please see the performing provider's procedural report for further detail. IMPRESSION: Fluoroscopic images demonstrate posterior cervical fusion of C2-C6. Electronically Signed   By: Yetta Glassman M.D.   On: 08/04/2021 17:05   DG C-Arm 1-60 Min-No Report  Result Date: 08/04/2021 Fluoroscopy was utilized by the requesting physician.  No radiographic interpretation.   DG C-Arm 1-60 Min-No Report  Result Date: 08/04/2021 Fluoroscopy was utilized by the requesting physician.  No radiographic interpretation.   DG C-Arm 1-60 Min-No Report  Result Date: 08/04/2021  Fluoroscopy was utilized by the requesting physician.  No radiographic interpretation.      Scheduled Meds:  aMILoride  5 mg Oral Q M,W,F   amLODipine  10 mg Oral Daily   atorvastatin  20 mg Oral Daily   docusate sodium  100 mg Oral BID   gabapentin  300 mg Oral BID   labetalol  300 mg Oral BID   leflunomide  20 mg Oral Daily   magnesium oxide  400 mg Oral Daily   mupirocin ointment  1 Application Nasal BID   senna  1 tablet Oral BID   sodium chloride flush  3 mL Intravenous Q12H   tamsulosin  0.4 mg Oral Daily   Continuous Infusions:  sodium chloride     methocarbamol (ROBAXIN) IV       LOS: 2 days     Noralee Stain, DO [facility] 08/06/2021, 11:56 AM   Available via Epic secure chat 7am-7pm After these hours, please refer to coverage provider listed on [email or web address]

## 2021-08-06 NOTE — Progress Notes (Signed)
Occupational Therapy Treatment Patient Details Name: Shawn Decker MRN: [id number] DOB: 1945-10-03 Today's Date: 08/06/2021   History of present illness 76 y/o male presented to [facility] ED on 08/03/21 for increased weakness and balance difficulty along with numbness. 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 S/p posterior cervical decompression C2-6 with posterior fixation C2-7. PMH: HTN, T2DM   OT comments  Pt progressing towards goals, complete sit to stand transfer x2 with use of Stedy this session, pt mod A +2 for initial stand from chair, mod A for stand from [facility], max A for pericare in standing. Pt unable to perform figure 4 for LB dressing, began education on AE use, pt able to don sock min A with use of sock aid. Reviewed cervical precautions, pt needing mod A for log rolling technique to return to bed. Pt presenting with impairments listed below, will follow acutely. Continue to recommend AIR at d/c.   Recommendations for follow up therapy are one component of a multi-disciplinary discharge planning process, led by the attending physician.  Recommendations may be updated based on patient status, additional functional criteria and insurance authorization.    Follow Up Recommendations  Acute inpatient rehab (3hours/day)    Assistance Recommended at Discharge Intermittent Supervision/Assistance  Patient can return home with the following  Two people to help with walking and/or transfers;A lot of help with bathing/dressing/bathroom;Assistance with cooking/housework;Assist for transportation;Help with stairs or ramp for entrance   Equipment Recommendations  None recommended by OT;Other (comment) (defer to next venue of care)    Recommendations for Other Services PT consult;Rehab consult    Precautions / Restrictions Precautions Precautions: Cervical Precaution Booklet Issued: No Precaution Comments: verbally reviewed 3/3 cervical precautions Required Braces or  Orthoses: Cervical Brace Cervical Brace: Hard collar;At all times Restrictions Weight Bearing Restrictions: No       Mobility Bed Mobility Overal bed mobility: Needs Assistance Bed Mobility: Sit to Sidelying Rolling: Mod assist              Transfers Overall transfer level: Needs assistance Equipment used: Ambulation equipment used Transfers: Sit to/from Stand, Bed to chair/wheelchair/BSC Sit to Stand: Mod assist, +2 safety/equipment           General transfer comment: pt needing assist to lean forward/bring arms to bar of Stedy for transfer to [facility] Transfer via Lift Equipment: Stedy   Balance Overall balance assessment: Needs assistance Sitting-balance support: No upper extremity supported, Feet supported Sitting balance-Leahy Scale: Fair     Standing balance support: Bilateral upper extremity supported, Reliant on assistive device for balance Standing balance-Leahy Scale: Zero                             ADL either performed or assessed with clinical judgement   ADL Overall ADL's : Needs assistance/impaired                     Lower Body Dressing: With adaptive equipment;Sitting/lateral leans;Minimal assistance Lower Body Dressing Details (indicate cue type and reason): to don sock Toilet Transfer: Moderate assistance;+2 for physical assistance;Maximal assistance;BSC/3in1;Stand-pivot (STEDY) Toilet Transfer Details (indicate cue type and reason): BSC with use of stedy Toileting- Clothing Manipulation and Hygiene: Maximal assistance;Sitting/lateral lean       Functional mobility during ADLs: Moderate assistance;+2 for physical assistance      Extremity/Trunk Assessment Upper Extremity Assessment Upper Extremity Assessment: Generalized weakness   Lower Extremity Assessment Lower Extremity Assessment: Defer to PT  evaluation        Vision   Vision Assessment?: No apparent visual deficits   Perception Perception Perception: Not  tested   Praxis Praxis Praxis: Not tested    Cognition Arousal/Alertness: Awake/alert Behavior During Therapy: WFL for tasks assessed/performed Overall Cognitive Status: Within Functional Limits for tasks assessed                                          Exercises      Shoulder Instructions       General Comments VSS on RA    Pertinent Vitals/ Pain       Pain Assessment Pain Assessment: Faces Pain Score: 4  Faces Pain Scale: Hurts even more Pain Location: neck Pain Descriptors / Indicators: Discomfort, Grimacing, Sore Pain Intervention(s): Limited activity within patient's tolerance, Monitored during session, Repositioned  Home Living                                          Prior Functioning/Environment              Frequency  Min 3X/week        Progress Toward Goals  OT Goals(current goals can now be found in the care plan section)  Progress towards OT goals: Progressing toward goals  Acute Rehab OT Goals Patient Stated Goal: to get better OT Goal Formulation: With patient Time For Goal Achievement: 08/19/21 Potential to Achieve Goals: Good ADL Goals Pt Will Perform Upper Body Dressing: with supervision;sitting Pt Will Perform Lower Body Dressing: with min assist;with adaptive equipment;sitting/lateral leans;sit to/from stand;bed level Pt Will Transfer to Toilet: with mod assist;with +2 assist;bedside commode;stand pivot transfer;squat pivot transfer Additional ADL Goal #1: Pt will demo log rolling technique appropriately in order to adhere to back precautions for ADLs  Plan Discharge plan remains appropriate;Frequency remains appropriate    Co-evaluation                 AM-PAC OT "6 Clicks" Daily Activity     Outcome Measure   Help from another person eating meals?: A Little Help from another person taking care of personal grooming?: A Little Help from another person toileting, which includes using  toliet, bedpan, or urinal?: A Lot Help from another person bathing (including washing, rinsing, drying)?: A Lot Help from another person to put on and taking off regular upper body clothing?: A Little Help from another person to put on and taking off regular lower body clothing?: A Lot 6 Click Score: 15    End of Session Equipment Utilized During Treatment: Gait belt;Other (comment) Antony Salmon)  OT Visit Diagnosis: Unsteadiness on feet (R26.81);Other abnormalities of gait and mobility (R26.89);Muscle weakness (generalized) (M62.81)   Activity Tolerance Patient tolerated treatment well   Patient Left in bed;with call bell/phone within reach;with bed alarm set   Nurse Communication Mobility status        Time: 7628-3151 OT Time Calculation (min): 26 min  Charges: OT General Charges $OT Visit: 1 Visit OT Treatments $Self Care/Home Management : 23-37 mins  Alfonzo Beers, OTD, OTR/L Acute Rehab [phone]   Mayer Masker 08/06/2021, 2:44 PM

## 2021-08-06 NOTE — Progress Notes (Signed)
Patient ID: Shawn Decker, male   DOB: [birth year], 76 y.o.   MRN: [id number] BP (!) 129/53 (BP Location: Left Arm)   Pulse 76   Temp 98.1 F (36.7 C) (Oral)   Resp 20   Ht 5\' 9"  (1.753 m)   Wt 81.6 kg   SpO2 94%   BMI 26.58 kg/m  Alert and oriented x 4, speech is clear and fluent Moving upper extremities well Dressing is dry Moving lower extremities well Will need rehab

## 2021-08-06 NOTE — Progress Notes (Signed)
  Transition of Care [facility]) Screening Note   Patient Details  Name: Timtohy Broski Date of Birth: 1945-03-15   Transition of Care [facility]) CM/SW Contact:    Kermit Balo, RN Phone Number: 08/06/2021, 1:45 PM   Pt is s/p Cervical laminectomy C2-3 and C5-6 with decompression of spinal canal posterior segmental fixation from C2-C6 with facet screws posterolateral arthrodesis with autograft C2-3 C5-6. He is from home alone. Awaiting CIR to eval. Transition of Care Department [facility]) has reviewed patient. We will continue to monitor patient advancement through interdisciplinary progression rounds. If new patient transition needs arise, please place a TOC consult.

## 2021-08-06 NOTE — Plan of Care (Signed)

## 2021-08-07 DIAGNOSIS — M4802 Spinal stenosis, cervical region: Secondary | ICD-10-CM | POA: Diagnosis not present

## 2021-08-07 LAB — BASIC METABOLIC PANEL
Anion gap: 7 (ref 5–15)
BUN: 31 mg/dL — ABNORMAL HIGH (ref 8–23)
CO2: 25 mmol/L (ref 22–32)
Calcium: 9 mg/dL (ref 8.9–10.3)
Chloride: 104 mmol/L (ref 98–111)
Creatinine, Ser: 1.43 mg/dL — ABNORMAL HIGH (ref 0.61–1.24)
GFR, Estimated: 51 mL/min — ABNORMAL LOW (ref >60)
Glucose, Bld: 132 mg/dL — ABNORMAL HIGH (ref 70–99)
Potassium: 4.4 mmol/L (ref 3.5–5.1)
Sodium: 136 mmol/L (ref 135–145)

## 2021-08-07 LAB — CBC
HCT: 27.6 % — ABNORMAL LOW (ref 39.0–52.0)
Hemoglobin: 8.8 g/dL — ABNORMAL LOW (ref 13.0–17.0)
MCH: 27.3 pg (ref 26.0–34.0)
MCHC: 31.9 g/dL (ref 30.0–36.0)
MCV: 85.7 fL (ref 80.0–100.0)
Platelets: 193 10*3/uL (ref 150–400)
RBC: 3.22 MIL/uL — ABNORMAL LOW (ref 4.22–5.81)
RDW: 14.6 % (ref 11.5–15.5)
WBC: 10.1 10*3/uL (ref 4.0–10.5)
nRBC: 0 % (ref 0.0–0.2)

## 2021-08-07 MED ORDER — OXYCODONE HCL 5 MG PO TABS
10.0000 mg | ORAL_TABLET | ORAL | Status: DC | PRN
  Administered 2021-08-07 – 2021-08-08 (×4): 15 mg via ORAL
  Administered 2021-08-08 (×2): 10 mg via ORAL
  Administered 2021-08-09 (×2): 15 mg via ORAL
  Filled 2021-08-07 (×4): qty 3
  Filled 2021-08-07 (×2): qty 2
  Filled 2021-08-07 (×2): qty 3

## 2021-08-07 NOTE — Plan of Care (Signed)

## 2021-08-07 NOTE — Progress Notes (Signed)
Physical Therapy Treatment Patient Details Name: Shawn Decker MRN: [id number] DOB: [birth year] Today's Date: 08/07/2021   History of Present Illness 76 y/o male presented to [facility] ED on 08/03/21 for increased weakness and balance difficulty along with numbness. 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 S/p posterior cervical decompression C2-6 with posterior fixation C2-7. PMH: HTN, T2DM    PT Comments    Pt progressing towards physical therapy goals. Was able to progress to gait training this session and tolerated ~10' ambulation before requiring a seated rest break. Pt motivated to participate with therapies and continue to feel he would thrive with the increased intensity of multidisciplinary rehab available at [facility]. Will continue to follow.     Recommendations for follow up therapy are one component of a multi-disciplinary discharge planning process, led by the attending physician.  Recommendations may be updated based on patient status, additional functional criteria and insurance authorization.  Follow Up Recommendations  Acute inpatient rehab (3hours/day)     Assistance Recommended at Discharge Frequent or constant Supervision/Assistance  Patient can return home with the following Two people to help with walking and/or transfers;Two people to help with bathing/dressing/bathroom;Assistance with cooking/housework;Assist for transportation;Help with stairs or ramp for entrance   Equipment Recommendations  Other (comment) (TBD by next venue of care)    Recommendations for Other Services Rehab consult     Precautions / Restrictions Precautions Precautions: Cervical Precaution Booklet Issued: No Precaution Comments: verbally reviewed 3/3 cervical precautions Required Braces or Orthoses: Cervical Brace Cervical Brace: Hard collar;At all times Restrictions Weight Bearing Restrictions: No     Mobility  Bed Mobility Overal bed mobility: Needs Assistance Bed  Mobility: Sit to Sidelying Rolling: Min guard       Sit to sidelying: Mod assist General bed mobility comments: VC's for optimal log roll technique. Pt was able to transition from sitting to sidelying with assist for LE elevation and guiding trunk to maintain spinal precautions.    Transfers Overall transfer level: Needs assistance Equipment used: Rolling walker (2 wheels) Transfers: Sit to/from Stand, Bed to chair/wheelchair/BSC Sit to Stand: Mod assist, +2 safety/equipment   Step pivot transfers: Mod assist       General transfer comment: VC's for hand placement on seated surface for safety, positioning feet back towards chair, and scooting hips to edge of chair before initiating sit<>stand. Transfer via Lift Equipment: Stedy  Ambulation/Gait Ambulation/Gait assistance: Mod assist, +2 safety/equipment, Max assist Gait Distance (Feet): 10 Feet (10', seated rest, 5') Assistive device: Rolling walker (2 wheels) Gait Pattern/deviations: Step-to pattern, Decreased stride length, Shuffle, Decreased dorsiflexion - right, Decreased dorsiflexion - left, Knees buckling, Knee flexed in stance - right, Knee flexed in stance - left Gait velocity: Decreased Gait velocity interpretation: <1.31 ft/sec, indicative of household ambulator   General Gait Details: Pt able to progress to gait training this session. He was able to ambulate ~10' as a max distance, and then knees began becoming more flexed with attempts at advancing LE's. Mod to max assist required by end of gait training.   Stairs             Wheelchair Mobility    Modified Rankin (Stroke Patients Only)       Balance Overall balance assessment: Needs assistance Sitting-balance support: No upper extremity supported, Feet supported Sitting balance-Leahy Scale: Fair     Standing balance support: Bilateral upper extremity supported, Reliant on assistive device for balance Standing balance-Leahy Scale: Poor Standing  balance comment: Reliant on UE support  on RW. Mod-max throughout.                            Cognition Arousal/Alertness: Awake/alert Behavior During Therapy: WFL for tasks assessed/performed Overall Cognitive Status: Within Functional Limits for tasks assessed                                          Exercises      General Comments        Pertinent Vitals/Pain Pain Assessment Pain Assessment: 0-10 Pain Score: 5  Pain Location: neck Pain Descriptors / Indicators: Discomfort, Grimacing, Sore Pain Intervention(s): Limited activity within patient's tolerance, Monitored during session, Repositioned    Home Living                          Prior Function            PT Goals (current goals can now be found in the care plan section) Acute Rehab PT Goals Patient Stated Goal: to get stronger PT Goal Formulation: With patient Time For Goal Achievement: 08/19/21 Potential to Achieve Goals: Good Progress towards PT goals: Progressing toward goals    Frequency    Min 5X/week      PT Plan Current plan remains appropriate    Co-evaluation              AM-PAC PT "6 Clicks" Mobility   Outcome Measure  Help needed turning from your back to your side while in a flat bed without using bedrails?: A Lot Help needed moving from lying on your back to sitting on the side of a flat bed without using bedrails?: A Lot Help needed moving to and from a bed to a chair (including a wheelchair)?: Total Help needed standing up from a chair using your arms (e.g., wheelchair or bedside chair)?: A Lot Help needed to walk in hospital room?: Total Help needed climbing 3-5 steps with a railing? : Total 6 Click Score: 9    End of Session Equipment Utilized During Treatment: Gait belt;Cervical collar Activity Tolerance: Patient tolerated treatment well Patient left: in chair;with call bell/phone within reach Nurse Communication: Mobility status PT  Visit Diagnosis: Unsteadiness on feet (R26.81);Muscle weakness (generalized) (M62.81);Other abnormalities of gait and mobility (R26.89)     Time: 1041-1100 PT Time Calculation (min) (ACUTE ONLY): 19 min  Charges:  $Gait Training: 8-22 mins                     Shawn Decker, PT, DPT Acute Rehabilitation Services Secure Chat Preferred Office: [phone]    Shawn Decker 08/07/2021, 11:28 AM

## 2021-08-07 NOTE — PMR Pre-admission (Signed)
PMR Admission Coordinator Pre-Admission Assessment  Patient: Shawn Decker is an 76 y.o., male MRN: [id number] DOB: 07-23-1945 Height: _0  (175.3 cm) Weight: 81.6 kg  Insurance Information HMO: yes     PPO:      PCP:      IPA:      80/20:      OTHER:  PRIMARY: [employer] Medicare       Policy#: [id number]      Subscriber: pt CM Name: Dewitt Rota        Phone#: [phone]     Fax#: [phone]  Lana with [employer] called with approval on 08/08/21 with approval 08/08/21-08/14/21 with update due 6/57/90 Pre-Cert#: [id number]            Employer:  Benefits:  Phone #:      Name:  Irene Shipper Date: 11/18/2020 - still active  Deductible: $226 ($226 met)  OOP Max: $8,300 ($464.65 met) CIR: $2,050 copay/admission  SNF: SNF: $0/day co-pay for days 1-20, $200/day co-pay for days 21-100; limited to 100 days/cal yr  Outpatient:  80% coverage; 20% co-insurance  Home Health:  100% coverage  DME: 80% coverage; 20% co-insurance  Providers: in network  SECONDARY: none      Policy#:      Phone#:   Development worker, community:       Phone#:   The Engineer, petroleum" for patients in Inpatient Rehabilitation Facilities with attached "Privacy Act [place] Records" was provided and verbally reviewed with: pt  Emergency Contact Information Contact Information     Name Relation Home Work [place] Daughter [phone]     jong, rickman   [phone]   Kasyn, Rolph   [phone]       Current Medical History  Patient Admitting Diagnosis: Cervical Stenosis  Myelopathy  History of Present Illness: : Shawn Decker is a 76 y.o. male with medical history significant of HTN, HLD, and DM presenting to [facility] ED 6/17 with numbness/weakness/tingling.  He reports having a long h/o peripheral neuropathy.  He also remotely had C2-3 and 3-4 fusions. About 2 PTA he noticed tingling in his B fingers, symmetric.  He also noticed around the same time worsening tingling/numbness in  his B feet.  It has progressed to weakness.  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 Neurosurgery performed  posterior cervical decompression C2-6 with posterior fixation C2-7. Pt. In [employer]. Pt. Also with hypertension, managed with  Amiloride, amlodipine, labetalol. 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 PT/OT saw Pt. And recommended CIR to assist return to PLOF.   Patient's medical record from [facility] has been reviewed by the rehabilitation admission coordinator and physician.  Past Medical History  Past Medical History:  Diagnosis Date   Cervical stenosis of spine    Diabetes mellitus without complication ([place])    DNR (do not resuscitate)    Hyperlipidemia    Hypertension    Stage 3a chronic kidney disease (CKD) ([place])     Has the patient had major surgery during 100 days prior to admission? Yes  Family History   family history is not on file.  Current Medications  Current Facility-Administered Medications:    0.9 %  sodium chloride infusion, 250 mL, Intravenous, Continuous, Elsner, Henry, MD   acetaminophen (TYLENOL) tablet 650 mg, 650 mg, Oral, Q4H PRN **OR** acetaminophen (TYLENOL) suppository 650 mg, 650 mg, Rectal, Q4H PRN, Kristeen Miss, MD   alum & mag hydroxide-simeth (  MAALOX/MYLANTA) 200-200-20 MG/5ML suspension 30 mL, 30 mL, Oral, Q6H PRN, Kristeen Miss, MD   aMILoride [facility]) tablet 5 mg, 5 mg, Oral, Q M,W,F, Kristeen Miss, MD, 5 mg at 08/08/21 0919   amLODipine (NORVASC) tablet 10 mg, 10 mg, Oral, Daily, Kristeen Miss, MD, 10 mg at 08/08/21 0920   atorvastatin (LIPITOR) tablet 20 mg, 20 mg, Oral, Daily, Kristeen Miss, MD, 20 mg at 08/08/21 0919   bisacodyl (DULCOLAX) suppository 10 mg, 10 mg, Rectal, Daily PRN, Kristeen Miss, MD   docusate sodium (COLACE) capsule 100 mg, 100 mg, Oral, BID, Kristeen Miss, MD, 100 mg at 08/08/21 0919   gabapentin (NEURONTIN) capsule 300 mg, 300 mg, Oral, BID,  Kristeen Miss, MD, 300 mg at 08/08/21 2208   hydrALAZINE (APRESOLINE) injection 10 mg, 10 mg, Intravenous, Q2H PRN, Kristeen Miss, MD   labetalol (NORMODYNE) tablet 300 mg, 300 mg, Oral, BID, Kristeen Miss, MD, 300 mg at 08/08/21 2208   leflunomide (ARAVA) tablet 20 mg, 20 mg, Oral, Daily, Kristeen Miss, MD, 20 mg at 08/08/21 0919   magnesium oxide (MAG-OX) tablet 400 mg, 400 mg, Oral, Daily, Kristeen Miss, MD, 400 mg at 08/08/21 0920   menthol-cetylpyridinium (CEPACOL) lozenge 3 mg, 1 lozenge, Oral, PRN **OR** phenol (CHLORASEPTIC) mouth spray 1 spray, 1 spray, Mouth/Throat, PRN, Kristeen Miss, MD   methocarbamol (ROBAXIN) tablet 500 mg, 500 mg, Oral, Q6H PRN, 500 mg at 08/08/21 2207 **OR** methocarbamol (ROBAXIN) 500 mg in dextrose 5 % 50 mL IVPB, 500 mg, Intravenous, Q6H PRN, Kristeen Miss, MD   morphine (PF) 2 MG/ML injection 2 mg, 2 mg, Intravenous, Q2H PRN, Kristeen Miss, MD   mupirocin ointment (BACTROBAN) 2 % 1 Application, 1 Application, Nasal, BID, Kristeen Miss, MD, 1 Application at 63/33/54 2217   ondansetron (ZOFRAN) tablet 4 mg, 4 mg, Oral, Q6H PRN **OR** ondansetron (ZOFRAN) injection 4 mg, 4 mg, Intravenous, Q6H PRN, Kristeen Miss, MD   oxyCODONE (Oxy IR/ROXICODONE) immediate release tablet 10-15 mg, 10-15 mg, Oral, Q4H PRN, Dessa Phi, DO, 15 mg at 08/09/21 5625   polyethylene glycol (MIRALAX / GLYCOLAX) packet 17 g, 17 g, Oral, Daily PRN, Kristeen Miss, MD   senna (SENOKOT) tablet 8.6 mg, 1 tablet, Oral, BID, Elsner, Henry, MD, 8.6 mg at 08/08/21 0919   sodium chloride flush (NS) 0.9 % injection 3 mL, 3 mL, Intravenous, Q12H, Elsner, Mallie Mussel, MD, 3 mL at 08/08/21 2214   sodium chloride flush (NS) 0.9 % injection 3 mL, 3 mL, Intravenous, PRN, Kristeen Miss, MD   sodium phosphate (FLEET) 7-19 GM/118ML enema 1 enema, 1 enema, Rectal, Once PRN, Kristeen Miss, MD   tamsulosin [facility]) capsule 0.4 mg, 0.4 mg, Oral, Daily, Kristeen Miss, MD, 0.4 mg at 08/08/21 0920  Patients Current  Diet:  Diet Order             Diet regular Room service appropriate? Yes; Fluid consistency: Thin  Diet effective now                   Precautions / Restrictions Precautions Precautions: Cervical Precaution Booklet Issued: No Precaution Comments: verbally reviewed 3/3 cervical precautions Cervical Brace: Hard collar, At all times Restrictions Weight Bearing Restrictions: No   Has the patient had 2 or more falls or a fall with injury in the past year? No  Prior Activity Level Community (5-7x/wk): Pt active in the community PTA  Prior Functional Level Self Care: Did the patient need help bathing, dressing, using the toilet or eating? Independent  Indoor Mobility: Did the  patient need assistance with walking from room to room (with or without device)? Independent  Stairs: Did the patient need assistance with internal or external stairs (with or without device)? Independent  Functional Cognition: Did the patient need help planning regular tasks such as shopping or remembering to take medications? Independent  Patient Information Are you of Hispanic, Latino/a,or Spanish origin?: A. No, not of Hispanic, Latino/a, or Spanish origin What is your race?: B. Black or African American Do you need or want an interpreter to communicate with a doctor or health care staff?: 0. No  Patient's Response To:  Health Literacy and Transportation Is the patient able to respond to health literacy and transportation needs?: Yes Health Literacy - How often do you need to have someone help you when you read instructions, pamphlets, or other written material from your doctor or pharmacy?: Never In the past 12 months, has lack of transportation kept you from medical appointments or from getting medications?: No In the past 12 months, has lack of transportation kept you from meetings, work, or from getting things needed for daily living?: No  Home Assistive Devices / [place]  Devices/Equipment: Radio producer (specify quad or straight) Home Equipment: Shower seat - built in, [employer] (2 wheels), [employer] - single point, BSC/3in1  Prior Device Use: Indicate devices/aids used by the patient prior to current illness, exacerbation or injury? None of the above  Current Functional Level Cognition  Overall Cognitive Status: Within Functional Limits for tasks assessed Orientation Level: Oriented X4    Extremity Assessment (includes Sensation/Coordination)  Upper Extremity Assessment: Generalized weakness  Lower Extremity Assessment: Defer to PT evaluation    ADLs  Overall ADL's : Needs assistance/impaired Eating/Feeding: Minimal assistance, Sitting Grooming: Minimal assistance, Sitting Upper Body Bathing: Sitting, Moderate assistance Lower Body Bathing: Maximal assistance, Sitting/lateral leans Upper Body Dressing : Minimal assistance, Sitting Lower Body Dressing: With adaptive equipment, Sitting/lateral leans, Minimal assistance Lower Body Dressing Details (indicate cue type and reason): to don sock Toilet Transfer: Moderate assistance, +2 for physical assistance, Maximal assistance, BSC/3in1, Stand-pivot (STEDY) Toilet Transfer Details (indicate cue type and reason): BSC with use of stedy Toileting- Clothing Manipulation and Hygiene: Maximal assistance, Sitting/lateral lean Functional mobility during ADLs: Moderate assistance, +2 for physical assistance    Mobility  Overal bed mobility: Needs Assistance Bed Mobility: Sit to Sidelying Rolling: Min assist Sidelying to sit: Mod assist Sit to sidelying: Mod assist General bed mobility comments: VC's for optimal log roll technique. Pt was able to transition to/from EOB with step-by-step cues and assist to guide trunk to maintain spinal precautions.    Transfers  Overall transfer level: Needs assistance Equipment used: Rolling walker (2 wheels) Transfers: Sit to/from Stand, Bed to chair/wheelchair/BSC Sit to Stand:  Mod assist, +2 safety/equipment Bed to/from chair/wheelchair/BSC transfer type:: Stand pivot Stand pivot transfers: Max assist, +2 safety/equipment Step pivot transfers: Mod assist Transfer via Lift Equipment: Stedy General transfer comment: Pt with increased difficulty taking pivotal steps this session. Increased assist from therapist to guide hips around to sit in the chair, and then again guide hips around from chair>bed at end of session.    Ambulation / Gait / Stairs / Wheelchair Mobility  Ambulation/Gait Ambulation/Gait assistance: +2 safety/equipment, Max assist Gait Distance (Feet): 5 Feet Assistive device: Rolling walker (2 wheels) Gait Pattern/deviations: Step-to pattern, Decreased stride length, Shuffle, Decreased dorsiflexion - right, Decreased dorsiflexion - left, Knees buckling, Knee flexed in stance - right, Knee flexed in stance - left General Gait Details: Pt with  very flexed knees throughout OOB mobility. Increased difficulty improving knee extension and posture. Up to max assist provided for balance support and safety. Gait velocity: Decreased Gait velocity interpretation: <1.31 ft/sec, indicative of household ambulator Pre-gait activities: Gait training terminated, and opted for sit<>stand to bed rail (raised). Pt only able to tolerate 1 stand.    Posture / Balance Balance Overall balance assessment: Needs assistance Sitting-balance support: No upper extremity supported, Feet supported Sitting balance-Leahy Scale: Fair Standing balance support: Bilateral upper extremity supported, Reliant on assistive device for balance Standing balance-Leahy Scale: Poor Standing balance comment: Reliant on UE support on RW. Mod-max throughout.    Special needs/care consideration Skin Post op neck incision with dressing, Diabetic management Yes has h/o DM, and Special service needs None   Previous Home Environment (from acute therapy documentation) Living Arrangements:  Alone Available Help at Discharge: Family, Friend(s), Available PRN/intermittently Type of Home: House Home Layout: Two level Alternate Level Stairs-Number of Steps: has stairlift Home Access: Stairs to enter Entrance Stairs-Rails: None Entrance Stairs-Number of Steps: 4 in front, 3 in garage [employer] Shower/Tub: Multimedia programmer: Handicapped height Bathroom Accessibility: Yes How Accessible: Accessible via wheelchair [place]: No  Discharge Living Setting Plans for Discharge Living Setting: Patient's home Type of Home at Discharge: House Discharge Home Layout: Two level Alternate Level Stairs-Rails: None Alternate Level Stairs-Number of Steps: Has chair lift Discharge Home Access: Stairs to enter Entrance Stairs-Rails: Right Entrance Stairs-Number of Steps: 4 Discharge Bathroom Shower/Tub: Walk-in shower Discharge Bathroom Toilet: Handicapped height Discharge Bathroom Accessibility: Yes How Accessible: Accessible via walker Does the patient have any problems obtaining your medications?: No  Social/Family/Support Systems Patient Roles: Other (Comment)  Goals Patient/Family Goal for Rehab: PT/OT Supervision Expected length of stay: 14-16 days Pt/Family Agrees to Admission and willing to participate: Yes Program Orientation Provided & Reviewed with Pt/Caregiver Including Roles  & Responsibilities: Yes  Decrease burden of Care through IP rehab admission: Specialzed equipment needs, Decrease number of caregivers, Bowel and bladder program, and Patient/family education  Possible need for SNF placement upon discharge: not anticipated   Patient Condition: I have reviewed medical records from [facility], spoken with CM, and patient. I met with patient at the bedside for inpatient rehabilitation assessment.  Patient will benefit from ongoing PT, OT, and SLP, can actively participate in 3 hours of therapy a day 5 days of the week, and can make  measurable gains during the admission.  Patient will also benefit from the coordinated team approach during an Inpatient Acute Rehabilitation admission.  The patient will receive intensive therapy as well as Rehabilitation physician, nursing, social worker, and care management interventions.  Due to safety, skin/wound care, disease management, medication administration, pain management, and patient education the patient requires 24 hour a day rehabilitation nursing.  The patient is currently min to max assist with mobility and basic ADLs.  Discharge setting and therapy post discharge at home with home health is anticipated.  Patient has agreed to participate in the Acute Inpatient Rehabilitation Program and will admit today.  Preadmission Screen Completed By:  Retta Diones, 08/09/2021 9:52 AM ______________________________________________________________________   Discussed status with Dr. Dagoberto Ligas on 08/09/21 at 0945 and received approval for admission today.  Admission Coordinator:  Retta Diones, RN, time 0952/Date 08/09/21   Assessment/Plan: Diagnosis: Does the need for close, 24 hr/day Medical supervision in concert with the patient's rehab needs make it unreasonable for this patient to be served in a less intensive setting?  Yes Co-Morbidities requiring supervision/potential complications: HTN, DM; peripherla neuropathy; Cord compression- s/p ACDF C2-C7- due to cervical myelopathy Due to bladder management, bowel management, safety, skin/wound care, disease management, medication administration, pain management, and patient education, does the patient require 24 hr/day rehab nursing? Yes Does the patient require coordinated care of a physician, rehab nurse, PT, OT, and SLP to address physical and functional deficits in the context of the above medical diagnosis(es)? Yes Addressing deficits in the following areas: balance, endurance, locomotion, strength, transferring, bowel/bladder control,  bathing, dressing, feeding, grooming, toileting, and swallowing Can the patient actively participate in an intensive therapy program of at least 3 hrs of therapy 5 days a week? Yes The potential for patient to make measurable gains while on inpatient rehab is good Anticipated functional outcomes upon discharge from inpatient rehab: supervision PT, supervision OT, n/a SLP Estimated rehab length of stay to reach the above functional goals is: 14-16 days Anticipated discharge destination: Home 10. Overall Rehab/Functional Prognosis: good   MD Signature:

## 2021-08-07 NOTE — Progress Notes (Signed)
Patient ID: Shawn Decker, male   DOB: 1945-12-29, 76 y.o.   MRN: [id number] BP (!) 146/65 (BP Location: Right Arm)   Pulse 82   Temp 98.4 F (36.9 C) (Oral)   Resp 18   Ht 5\' 9"  (1.753 m)   Wt 81.6 kg   SpO2 98%   BMI 26.58 kg/m  Alert and oriented x 4, speech is clear and fluent Moving all extremities 4-/5 grips Wound is clean, dry, no signs of infection Continue to improve

## 2021-08-07 NOTE — Progress Notes (Signed)
Inpatient Rehab Admissions Coordinator:   I do not have insurance auth or a CIR bed for this Pt. Today.  I have reached out to pt.'s sons to attempt to confirm dispo but have not yet been able to speak with them. I will continue to follow for potential admit pending insurance auth and bed availability.   Megan Salon, MS, CCC-SLP Rehab Admissions Coordinator  [phone] (celll) [phone] (office)

## 2021-08-07 NOTE — Progress Notes (Signed)
PROGRESS NOTE    Yong Grieser  YWV:[id number] DOB: 16-Nov-1945 DOA: 08/03/2021 PCP: [facility]     Brief Narrative:  Fredie Majano is a 76 y.o. male with medical history significant of HTN, HLD, and DM presenting with numbness/weakness/tingling.  He reports having a long history of peripheral neuropathy.  He also remotely had C2-3 and 3-4 fusions.  About 2 weeks ago, he noticed tingling in his fingers, symmetric.  He also noticed around the same time worsening tingling/numbness in his feet.  It has progressed to weakness.  Three weeks ago, he was able to ambulate independently; now he can barely get around with a walker.  He is unable to lift his arms above his chest.  His legs are increasingly weak.  No bowel/bladder incontinence. MRI showed severe cervical stenosis with cord signal abnormality. Dr. Danielle Dess of neurosurgery consulted.  He underwent cervical laminectomy with decompression on 6/17.  New events last 24 hours / Subjective: Doing well today, no complaints on my examination.  Assessment & Plan:  Principal Problem:   Cervical spinal stenosis Active Problems:   Diabetes mellitus without complication (HCC)   Hyperlipidemia   Hypertension   Stage 3a chronic kidney disease (CKD) (HCC)   DNR (do not resuscitate)   Cervical stenosis with myelopathy -Status post cervical laminectomy C2-3 and C5-6 with decompression 6/17 -Continue hard cervical collar for the next several weeks.  Follow-up with Dr. Danielle Dess in 10-14 days for staple removal and wound check as outpatient -PT OT evaluation recommending CIR  Hypertension -Amiloride, amlodipine, labetalol -Losartan on hold due to AKI.  Likely can resume in a few days  AKI on CKD stage IIIa -Baseline creatinine 1.4 -Resolved  Hyperlipidemia -Atorvastatin    DVT prophylaxis:  SCD's Start: 08/04/21 1640 SCDs Start: 08/04/21 0829  Code Status: DNR Family Communication: None at bedside  Disposition  Plan:  Status is: Inpatient Remains inpatient appropriate because: CIR placement pending  Antimicrobials:  Anti-infectives (From admission, onward)    Start     Dose/Rate Route Frequency Ordered Stop   08/04/21 2000  ceFAZolin (ANCEF) IVPB 2g/100 mL premix        2 g 200 mL/hr over 30 Minutes Intravenous Every 8 hours 08/04/21 1639 08/06/21 0925   08/04/21 1043  ceFAZolin (ANCEF) 2-4 GM/100ML-% IVPB       Note to Pharmacy: Evern Bio K: cabinet override      08/04/21 1043 08/04/21 2259        Objective: Vitals:   08/06/21 1205 08/06/21 1553 08/06/21 2028 08/07/21 0739  BP: (!) 136/49 (!) 129/53 (!) 125/56 (!) 168/72  Pulse: 83 76 81 79  Resp: 14 20 18 18   Temp: 98 F (36.7 C) 98.1 F (36.7 C) 98.9 F (37.2 C) 98.5 F (36.9 C)  TempSrc: Oral Oral Oral Oral  SpO2: 99% 94% 99% 96%  Weight:      Height:        Intake/Output Summary (Last 24 hours) at 08/07/2021 1054 Last data filed at 08/07/2021 0900 Gross per 24 hour  Intake 635 ml  Output 2000 ml  Net -1365 ml    Filed Weights   08/03/21 1328 08/04/21 1042  Weight: 81.6 kg 81.6 kg    Examination:  General exam: Appears calm and comfortable, cervical collar in place Central nervous system: Alert and oriented.  Speech is clear.  Bilateral lower extremity strength 4-5 out of 5, bilateral upper extremity strength 5 out of 5 Extremities: Symmetric in appearance  Psychiatry: Judgement  and insight appear normal. Mood & affect appropriate.   Data Reviewed: I have personally reviewed following labs and imaging studies  CBC: Recent Labs  Lab 08/03/21 1605 08/05/21 0246 08/06/21 0316 08/07/21 0145  WBC 6.0 13.0* 12.6* 10.1  NEUTROABS 3.7  --   --   --   HGB 10.9* 10.3* 9.3* 8.8*  HCT 33.3* 31.2* 28.2* 27.6*  MCV 85.6 84.3 84.7 85.7  PLT 234 223 201 193    Basic Metabolic Panel: Recent Labs  Lab 08/03/21 1605 08/05/21 0246 08/06/21 0316 08/07/21 0145  NA 138 134* 136 136  K 3.8 4.7 4.5 4.4  CL 108  102 105 104  CO2 24 22 24 25   GLUCOSE 104* 196* 115* 132*  BUN 22 27* 28* 31*  CREATININE 1.59* 1.75* 1.28* 1.43*  CALCIUM 9.1 8.9 8.5* 9.0  MG 1.4*  --   --   --     GFR: Estimated Creatinine Clearance: 44.6 mL/min (A) (by C-G formula based on SCr of 1.43 mg/dL (H)). Liver Function Tests: No results for input(s): "AST", "ALT", "ALKPHOS", "BILITOT", "PROT", "ALBUMIN" in the last 168 hours. No results for input(s): "LIPASE", "AMYLASE" in the last 168 hours. No results for input(s): "AMMONIA" in the last 168 hours. Coagulation Profile: No results for input(s): "INR", "PROTIME" in the last 168 hours. Cardiac Enzymes: No results for input(s): "CKTOTAL", "CKMB", "CKMBINDEX", "TROPONINI" in the last 168 hours. BNP (last 3 results) No results for input(s): "PROBNP" in the last 8760 hours. HbA1C: No results for input(s): "HGBA1C" in the last 72 hours. CBG: Recent Labs  Lab 08/04/21 1045  GLUCAP 148*    Lipid Profile: No results for input(s): "CHOL", "HDL", "LDLCALC", "TRIG", "CHOLHDL", "LDLDIRECT" in the last 72 hours. Thyroid Function Tests: No results for input(s): "TSH", "T4TOTAL", "FREET4", "T3FREE", "THYROIDAB" in the last 72 hours. Anemia Panel: No results for input(s): "VITAMINB12", "FOLATE", "FERRITIN", "TIBC", "IRON", "RETICCTPCT" in the last 72 hours. Sepsis Labs: No results for input(s): "PROCALCITON", "LATICACIDVEN" in the last 168 hours.  Recent Results (from the past 240 hour(s))  Surgical PCR screen     Status: None   Collection Time: 08/04/21  9:43 AM   Specimen: Nasal Mucosa; Nasal Swab  Result Value Ref Range Status   MRSA, PCR NEGATIVE NEGATIVE Final   Staphylococcus aureus NEGATIVE NEGATIVE Final    Comment: (NOTE) The Xpert SA Assay (FDA approved for NASAL specimens in patients 20 years of age and older), is one component of a comprehensive surveillance program. It is not intended to diagnose infection nor to guide or monitor treatment. Performed at  [facility], [address], [place], [place]       Radiology Studies: No results found.    Scheduled Meds:  aMILoride  5 mg Oral Q M,W,F   amLODipine  10 mg Oral Daily   atorvastatin  20 mg Oral Daily   docusate sodium  100 mg Oral BID   gabapentin  300 mg Oral BID   labetalol  300 mg Oral BID   leflunomide  20 mg Oral Daily   magnesium oxide  400 mg Oral Daily   mupirocin ointment  1 Application Nasal BID   senna  1 tablet Oral BID   sodium chloride flush  3 mL Intravenous Q12H   tamsulosin  0.4 mg Oral Daily   Continuous Infusions:  sodium chloride     methocarbamol (ROBAXIN) IV       LOS: 3 days     [id number]  Maylene Roes, DO [facility] 08/07/2021, 10:54 AM   Available via Epic secure chat 7am-7pm After these hours, please refer to coverage provider listed on [email or web address]

## 2021-08-07 NOTE — Anesthesia Postprocedure Evaluation (Signed)
Anesthesia Post Note  Patient: Shawn Decker  Procedure(s) Performed: POSTERIOR CERVICAL FUSION/FORAMINOTOMY FIXATION Cervical two -Cervical six (Neck)     Patient location during evaluation: PACU Anesthesia Type: General Level of consciousness: awake and patient cooperative Pain management: pain level controlled Vital Signs Assessment: post-procedure vital signs reviewed and stable Respiratory status: spontaneous breathing, nonlabored ventilation, respiratory function stable and patient connected to nasal cannula oxygen Cardiovascular status: blood pressure returned to baseline and stable Postop Assessment: no apparent nausea or vomiting Anesthetic complications: no   No notable events documented.  Last Vitals:  Vitals:   08/07/21 1131 08/07/21 1500  BP: (!) 151/74 (!) 146/65  Pulse: 78 82  Resp: 18 18  Temp: 36.8 C 36.9 C  SpO2: 97% 98%    Last Pain:  Vitals:   08/07/21 1500  TempSrc: Oral  PainSc:                  [name]

## 2021-08-07 NOTE — Care Management Important Message (Signed)
Important Message  Patient Details  Name: Shawn Decker MRN: [id number] Date of Birth: 29-Aug-1945   Medicare Important Message Given:  Yes     Dorena Bodo 08/07/2021, 4:06 PM

## 2021-08-08 DIAGNOSIS — M4802 Spinal stenosis, cervical region: Secondary | ICD-10-CM | POA: Diagnosis not present

## 2021-08-08 NOTE — Progress Notes (Signed)
Physical Therapy Treatment Patient Details Name: Shawn Decker MRN: [id number] DOB: [birth year] Today's Date: 08/08/2021   History of Present Illness 76 y/o male presented to [facility] ED on 08/03/21 for increased weakness and balance difficulty along with numbness. 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 S/p posterior cervical decompression C2-6 with posterior fixation C2-7. PMH: HTN, T2DM    PT Comments    Pt progressing slowly towards physical therapy goals. Overall pt not able to demonstrate quality steps today during gait training, and knees becoming more flexed with activity. Pt endorses being tired, stating he has not been able to rest much today. Continue to feel pt is appropriate for AIR level rehab, and will continue to progress as able per PT POC while admitted acutely.     Recommendations for follow up therapy are one component of a multi-disciplinary discharge planning process, led by the attending physician.  Recommendations may be updated based on patient status, additional functional criteria and insurance authorization.  Follow Up Recommendations  Acute inpatient rehab (3hours/day)     Assistance Recommended at Discharge Frequent or constant Supervision/Assistance  Patient can return home with the following Two people to help with walking and/or transfers;Two people to help with bathing/dressing/bathroom;Assistance with cooking/housework;Assist for transportation;Help with stairs or ramp for entrance   Equipment Recommendations  Other (comment) (TBD by next venue of care)    Recommendations for Other Services Rehab consult     Precautions / Restrictions Precautions Precautions: Cervical Precaution Booklet Issued: No Precaution Comments: verbally reviewed 3/3 cervical precautions Required Braces or Orthoses: Cervical Brace Cervical Brace: Hard collar;At all times Restrictions Weight Bearing Restrictions: No     Mobility  Bed Mobility Overal  bed mobility: Needs Assistance Bed Mobility: Sit to Sidelying Rolling: Min assist Sidelying to sit: Mod assist     Sit to sidelying: Mod assist General bed mobility comments: VC's for optimal log roll technique. Pt was able to transition to/from EOB with step-by-step cues and assist to guide trunk to maintain spinal precautions.    Transfers Overall transfer level: Needs assistance Equipment used: Rolling walker (2 wheels) Transfers: Sit to/from Stand, Bed to chair/wheelchair/BSC Sit to Stand: Mod assist, +2 safety/equipment Stand pivot transfers: Max assist, +2 safety/equipment         General transfer comment: Pt with increased difficulty taking pivotal steps this session. Increased assist from therapist to guide hips around to sit in the chair, and then again guide hips around from chair>bed at end of session.    Ambulation/Gait Ambulation/Gait assistance: +2 safety/equipment, Max assist Gait Distance (Feet): 5 Feet Assistive device: Rolling walker (2 wheels) Gait Pattern/deviations: Step-to pattern, Decreased stride length, Shuffle, Decreased dorsiflexion - right, Decreased dorsiflexion - left, Knees buckling, Knee flexed in stance - right, Knee flexed in stance - left Gait velocity: Decreased Gait velocity interpretation: <1.31 ft/sec, indicative of household ambulator Pre-gait activities: Gait training terminated, and opted for sit<>stand to bed rail (raised). Pt only able to tolerate 1 stand. General Gait Details: Pt with very flexed knees throughout OOB mobility. Increased difficulty improving knee extension and posture. Up to max assist provided for balance support and safety.   Stairs             Wheelchair Mobility    Modified Rankin (Stroke Patients Only)       Balance Overall balance assessment: Needs assistance Sitting-balance support: No upper extremity supported, Feet supported Sitting balance-Leahy Scale: Fair     Standing balance support:  Bilateral upper extremity supported,  Reliant on assistive device for balance Standing balance-Leahy Scale: Poor Standing balance comment: Reliant on UE support on RW. Mod-max throughout.                            Cognition Arousal/Alertness: Awake/alert Behavior During Therapy: WFL for tasks assessed/performed Overall Cognitive Status: Within Functional Limits for tasks assessed                                          Exercises General Exercises - Lower Extremity Long Arc Quad: 10 reps, Both    General Comments        Pertinent Vitals/Pain Pain Assessment Pain Assessment: Faces Faces Pain Scale: Hurts even more Pain Location: neck Pain Descriptors / Indicators: Discomfort, Grimacing, Sore Pain Intervention(s): Limited activity within patient's tolerance, Monitored during session, Repositioned    Home Living                          Prior Function            PT Goals (current goals can now be found in the care plan section) Acute Rehab PT Goals Patient Stated Goal: to get stronger PT Goal Formulation: With patient Time For Goal Achievement: 08/19/21 Potential to Achieve Goals: Good Progress towards PT goals: Progressing toward goals    Frequency    Min 5X/week      PT Plan Current plan remains appropriate    Co-evaluation              AM-PAC PT "6 Clicks" Mobility   Outcome Measure  Help needed turning from your back to your side while in a flat bed without using bedrails?: A Lot Help needed moving from lying on your back to sitting on the side of a flat bed without using bedrails?: A Lot Help needed moving to and from a bed to a chair (including a wheelchair)?: Total Help needed standing up from a chair using your arms (e.g., wheelchair or bedside chair)?: A Lot Help needed to walk in hospital room?: Total Help needed climbing 3-5 steps with a railing? : Total 6 Click Score: 9    End of Session Equipment  Utilized During Treatment: Gait belt;Cervical collar Activity Tolerance: Patient limited by fatigue Patient left: in chair;with call bell/phone within reach Nurse Communication: Mobility status PT Visit Diagnosis: Unsteadiness on feet (R26.81);Muscle weakness (generalized) (M62.81);Other abnormalities of gait and mobility (R26.89)     Time: 5784-6962 PT Time Calculation (min) (ACUTE ONLY): 25 min  Charges:  $Gait Training: 8-22 mins $Therapeutic Activity: 8-22 mins                     Conni Slipper, PT, DPT Acute Rehabilitation Services Secure Chat Preferred Office: [phone]    Marylynn Pearson 08/08/2021, 3:50 PM

## 2021-08-08 NOTE — Progress Notes (Signed)
Patient ID: Shawn Decker, male   DOB: 09/01/1945, 76 y.o.   MRN: [id number] BP (!) 155/69 (BP Location: Left Arm)   Pulse 78   Temp 99.3 F (37.4 C) (Oral)   Resp 19   Ht 5\' 9"  (1.753 m)   Wt 81.6 kg   SpO2 98%   BMI 26.58 kg/m  Alert and oriented x 4, speech is clear and fluent Dressing clean, dry Will need brAce total of two weeks Possible discharge to rehab, insurance granted approval.  Stable from neurosurgical perspective, may go to rehab.

## 2021-08-08 NOTE — Progress Notes (Signed)
PROGRESS NOTE    Shawn Decker  QIH:[id number] DOB: [birth year] DOA: 08/03/2021 PCP: [facility]    Brief Narrative:   Shawn Decker is a 76 y.o. male with past medical history significant for HTN, HLD, BPH who presented to [facility] ED on 6/16 with complaint of unsteady balance and numbness/tingling to fingers/toes that has been progressing for 2 weeks.  Patient reports longstanding history of peripheral neuropathy with remote C2-3 and C3-4 fusions.  Now this unsteady balance and numbness/tingling progressing to weakness.  3 weeks prior he was able to ambulate independently now can barely mobilize with the use of a walker and unable to lift his arms above his chest.  Denies bowel/bladder incontinence.  MRI on admission showed severe cervical stenosis with cord signal abnormality.  Neurosurgery, Dr. Danielle Dess was consulted.  TRH consulted for admission.  Assessment & Plan:   Cervical spinal stenosis with myelopathy Patient presenting to the ED with progressive numbness/tingling, weakness with MRI findings notable for severe cervical stenosis with cord signal abnormality.  Neurosurgery was consulted and patient underwent cervical laminectomy C2-3 and C5-6 with decompression on 08/04/2021 by Dr. Danielle Dess. --Continue hard cervical collar for next several weeks --Oxycodone 10-50 mg every 4 hours as needed moderate pain --Robaxin 5 mg p.o. every 6 hours as needed muscle spasms --PT/OT currently recommending CIR, pending bed availability and insurance authorization --Outpatient follow-up with neurosurgery, Dr. Danielle Dess 10-14 days for staple removal and wound check  Essential hypertension --Holding losartan due to AKI --Amlodipine 10 mg p.o. daily --Labetalol 300 mg p.o. twice daily --Amiloride 5 mg every Monday/Wednesday/Friday  Acute renal failure on CKD stage IIIa Baseline creatinine 1.4. --Cr 1.59>1.75>>1.43 --Holding losartan --Avoid nephrotoxins, renal dose all  medications --Repeat BMP in a.m.  Hyperlipidemia --Atorvastatin 20 mg p.o. daily  Peripheral neuropathy: Gabapentin 300 mg p.o. twice daily   DVT prophylaxis: SCD's Start: 08/04/21 1640 SCDs Start: 08/04/21 0829    Code Status: DNR Family Communication: No family present at bedside this morning  Disposition Plan:  Level of care: Med-Surg Status is: Inpatient Remains inpatient appropriate because: Pending insurance authorization and bed availability for CIR    Consultants:  Neurosurgery, Dr. Danielle Dess  Procedures:  cervical laminectomy C2-3 and C5-6 with decompression, Dr. Danielle Dess; 6/17  Antimicrobials:  Perioperative cefazolin   Subjective: Patient seen examined bedside, resting comfortably.  Lying in bed.  Asking if he can have another c-collar.  Awaiting bed availability and insurance authorization for CIR.  No other specific complaints or concerns at this time.  Denies headache, no dizziness, no chest pain, no palpitations, no fever/chills/night sweats, no nausea/vomiting/diarrhea, no abdominal pain, no cough/congestion.  No acute events overnight per nursing staff.  Objective: Vitals:   08/07/21 1500 08/07/21 2353 08/08/21 0352 08/08/21 0746  BP: (!) 146/65 (!) 150/74 (!) 155/63 (!) 147/75  Pulse: 82 76 76 73  Resp: 18 16 16 16   Temp: 98.4 F (36.9 C) 98.7 F (37.1 C) 98.7 F (37.1 C) 98.7 F (37.1 C)  TempSrc: Oral Oral Oral Oral  SpO2: 98% 98% 96% 97%  Weight:      Height:        Intake/Output Summary (Last 24 hours) at 08/08/2021 1422 Last data filed at 08/08/2021 1318 Gross per 24 hour  Intake 240 ml  Output 2700 ml  Net -2460 ml   Filed Weights   08/03/21 1328 08/04/21 1042  Weight: 81.6 kg 81.6 kg    Examination:  Physical Exam: GEN: NAD, alert and oriented x 3, wd/wn  HEENT: NCAT, PERRL, EOMI, sclera clear, MMM, cervical collar noted in place PULM: CTAB w/o wheezes/crackles, normal respiratory effort CV: RRR w/o M/G/R GI: abd soft, NTND, NABS,  no R/G/M MSK: no peripheral edema, muscle strength bilateral lower extremities 4-5/5; bilateral upper extremity strength 5/5 NEURO: CN II-XII intact, no focal deficits, sensation to light touch intact PSYCH: normal mood/affect Integumentary: dry/intact, no rashes or wounds    Data Reviewed: I have personally reviewed following labs and imaging studies  CBC: Recent Labs  Lab 08/03/21 1605 08/05/21 0246 08/06/21 0316 08/07/21 0145  WBC 6.0 13.0* 12.6* 10.1  NEUTROABS 3.7  --   --   --   HGB 10.9* 10.3* 9.3* 8.8*  HCT 33.3* 31.2* 28.2* 27.6*  MCV 85.6 84.3 84.7 85.7  PLT 234 223 201 0000000   Basic Metabolic Panel: Recent Labs  Lab 08/03/21 1605 08/05/21 0246 08/06/21 0316 08/07/21 0145  NA 138 134* 136 136  K 3.8 4.7 4.5 4.4  CL 108 102 105 104  CO2 24 22 24 25   GLUCOSE 104* 196* 115* 132*  BUN 22 27* 28* 31*  CREATININE 1.59* 1.75* 1.28* 1.43*  CALCIUM 9.1 8.9 8.5* 9.0  MG 1.4*  --   --   --    GFR: Estimated Creatinine Clearance: 44.6 mL/min (A) (by C-G formula based on SCr of 1.43 mg/dL (H)). Liver Function Tests: No results for input(s): "AST", "ALT", "ALKPHOS", "BILITOT", "PROT", "ALBUMIN" in the last 168 hours. No results for input(s): "LIPASE", "AMYLASE" in the last 168 hours. No results for input(s): "AMMONIA" in the last 168 hours. Coagulation Profile: No results for input(s): "INR", "PROTIME" in the last 168 hours. Cardiac Enzymes: No results for input(s): "CKTOTAL", "CKMB", "CKMBINDEX", "TROPONINI" in the last 168 hours. BNP (last 3 results) No results for input(s): "PROBNP" in the last 8760 hours. HbA1C: No results for input(s): "HGBA1C" in the last 72 hours. CBG: Recent Labs  Lab 08/04/21 1045  GLUCAP 148*   Lipid Profile: No results for input(s): "CHOL", "HDL", "LDLCALC", "TRIG", "CHOLHDL", "LDLDIRECT" in the last 72 hours. Thyroid Function Tests: No results for input(s): "TSH", "T4TOTAL", "FREET4", "T3FREE", "THYROIDAB" in the last 72  hours. Anemia Panel: No results for input(s): "VITAMINB12", "FOLATE", "FERRITIN", "TIBC", "IRON", "RETICCTPCT" in the last 72 hours. Sepsis Labs: No results for input(s): "PROCALCITON", "LATICACIDVEN" in the last 168 hours.  Recent Results (from the past 240 hour(s))  Surgical PCR screen     Status: None   Collection Time: 08/04/21  9:43 AM   Specimen: Nasal Mucosa; Nasal Swab  Result Value Ref Range Status   MRSA, PCR NEGATIVE NEGATIVE Final   Staphylococcus aureus NEGATIVE NEGATIVE Final    Comment: (NOTE) The Xpert SA Assay (FDA approved for NASAL specimens in patients 49 years of age and older), is one component of a comprehensive surveillance program. It is not intended to diagnose infection nor to guide or monitor treatment. Performed at [facility], [place] [address] [place], [place] [address]          Radiology Studies: No results found.      Scheduled Meds:  aMILoride  5 mg Oral Q M,W,F   amLODipine  10 mg Oral Daily   atorvastatin  20 mg Oral Daily   docusate sodium  100 mg Oral BID   gabapentin  300 mg Oral BID   labetalol  300 mg Oral BID   leflunomide  20 mg Oral Daily   magnesium oxide  400 mg Oral Daily  mupirocin ointment  1 Application Nasal BID   senna  1 tablet Oral BID   sodium chloride flush  3 mL Intravenous Q12H   tamsulosin  0.4 mg Oral Daily   Continuous Infusions:  sodium chloride     methocarbamol (ROBAXIN) IV       LOS: 4 days    Time spent: 51 minutes spent on chart review, discussion with nursing staff, consultants, updating family and interview/physical exam; more than 50% of that time was spent in counseling and/or coordination of care.    Alvira Philips [place], DO [facility] Available via Epic secure chat 7am-7pm After these hours, please refer to coverage provider listed on [email or web address] 08/08/2021, 2:22 PM

## 2021-08-08 NOTE — Progress Notes (Signed)
Inpatient Rehab Admissions Coordinator:   I do not have insurance auth or a CIR bed for this Pt. Today. I will continue to follow for potential admit pending insurance auth and bed availability.   Jacia Sickman, MS, CCC-SLP Rehab Admissions Coordinator  [phone] (celll) [phone] (office)  

## 2021-08-08 NOTE — Plan of Care (Signed)

## 2021-08-08 NOTE — Plan of Care (Signed)
  Problem: Education: Goal: Knowledge of General Education information will improve Description: Including pain rating scale, medication(s)/side effects and non-pharmacologic comfort measures Outcome: Progressing   Problem: Coping: Goal: Level of anxiety will decrease Outcome: Progressing   

## 2021-08-09 ENCOUNTER — Inpatient Hospital Stay (HOSPITAL_COMMUNITY)
Admission: RE | Admit: 2021-08-09 | Discharge: 2021-08-31 | DRG: 552 | Disposition: A | Discharge status: Home / Self Care | Payer: Medicare HMO | Source: Intra-hospital | Attending: Physical Medicine and Rehabilitation | Admitting: Physical Medicine and Rehabilitation

## 2021-08-09 ENCOUNTER — Encounter (HOSPITAL_COMMUNITY): Payer: Self-pay | Admitting: Physical Medicine and Rehabilitation

## 2021-08-09 ENCOUNTER — Encounter (HOSPITAL_COMMUNITY): Payer: Self-pay | Admitting: Family Medicine

## 2021-08-09 ENCOUNTER — Other Ambulatory Visit: Payer: Self-pay

## 2021-08-09 DIAGNOSIS — M545 Low back pain, unspecified: Secondary | ICD-10-CM | POA: Diagnosis not present

## 2021-08-09 DIAGNOSIS — E1165 Type 2 diabetes mellitus with hyperglycemia: Secondary | ICD-10-CM | POA: Diagnosis present

## 2021-08-09 DIAGNOSIS — M5412 Radiculopathy, cervical region: Secondary | ICD-10-CM | POA: Diagnosis present

## 2021-08-09 DIAGNOSIS — M25552 Pain in left hip: Secondary | ICD-10-CM | POA: Diagnosis not present

## 2021-08-09 DIAGNOSIS — Z79899 Other long term (current) drug therapy: Secondary | ICD-10-CM

## 2021-08-09 DIAGNOSIS — Z66 Do not resuscitate: Secondary | ICD-10-CM | POA: Diagnosis present

## 2021-08-09 DIAGNOSIS — G959 Disease of spinal cord, unspecified: Secondary | ICD-10-CM | POA: Diagnosis present

## 2021-08-09 DIAGNOSIS — R7989 Other specified abnormal findings of blood chemistry: Secondary | ICD-10-CM | POA: Diagnosis not present

## 2021-08-09 DIAGNOSIS — E1122 Type 2 diabetes mellitus with diabetic chronic kidney disease: Secondary | ICD-10-CM | POA: Diagnosis present

## 2021-08-09 DIAGNOSIS — M069 Rheumatoid arthritis, unspecified: Secondary | ICD-10-CM | POA: Diagnosis present

## 2021-08-09 DIAGNOSIS — D62 Acute posthemorrhagic anemia: Secondary | ICD-10-CM | POA: Diagnosis present

## 2021-08-09 DIAGNOSIS — Z801 Family history of malignant neoplasm of trachea, bronchus and lung: Secondary | ICD-10-CM

## 2021-08-09 DIAGNOSIS — N1831 Chronic kidney disease, stage 3a: Secondary | ICD-10-CM | POA: Diagnosis present

## 2021-08-09 DIAGNOSIS — G992 Myelopathy in diseases classified elsewhere: Secondary | ICD-10-CM | POA: Diagnosis present

## 2021-08-09 DIAGNOSIS — Z87891 Personal history of nicotine dependence: Secondary | ICD-10-CM

## 2021-08-09 DIAGNOSIS — R3915 Urgency of urination: Secondary | ICD-10-CM | POA: Diagnosis present

## 2021-08-09 DIAGNOSIS — D631 Anemia in chronic kidney disease: Secondary | ICD-10-CM | POA: Diagnosis present

## 2021-08-09 DIAGNOSIS — R270 Ataxia, unspecified: Secondary | ICD-10-CM | POA: Diagnosis present

## 2021-08-09 DIAGNOSIS — K5900 Constipation, unspecified: Secondary | ICD-10-CM | POA: Diagnosis not present

## 2021-08-09 DIAGNOSIS — I129 Hypertensive chronic kidney disease with stage 1 through stage 4 chronic kidney disease, or unspecified chronic kidney disease: Secondary | ICD-10-CM | POA: Diagnosis present

## 2021-08-09 DIAGNOSIS — M4802 Spinal stenosis, cervical region: Secondary | ICD-10-CM | POA: Diagnosis present

## 2021-08-09 DIAGNOSIS — I10 Essential (primary) hypertension: Secondary | ICD-10-CM | POA: Diagnosis present

## 2021-08-09 DIAGNOSIS — R1310 Dysphagia, unspecified: Secondary | ICD-10-CM | POA: Diagnosis present

## 2021-08-09 DIAGNOSIS — G629 Polyneuropathy, unspecified: Secondary | ICD-10-CM

## 2021-08-09 DIAGNOSIS — E871 Hypo-osmolality and hyponatremia: Secondary | ICD-10-CM | POA: Diagnosis not present

## 2021-08-09 DIAGNOSIS — R32 Unspecified urinary incontinence: Secondary | ICD-10-CM | POA: Diagnosis present

## 2021-08-09 DIAGNOSIS — R1312 Dysphagia, oropharyngeal phase: Secondary | ICD-10-CM | POA: Diagnosis not present

## 2021-08-09 DIAGNOSIS — Z981 Arthrodesis status: Secondary | ICD-10-CM | POA: Diagnosis not present

## 2021-08-09 DIAGNOSIS — E785 Hyperlipidemia, unspecified: Secondary | ICD-10-CM | POA: Diagnosis present

## 2021-08-09 DIAGNOSIS — Z8041 Family history of malignant neoplasm of ovary: Secondary | ICD-10-CM | POA: Diagnosis not present

## 2021-08-09 LAB — BASIC METABOLIC PANEL
Anion gap: 13 (ref 5–15)
Anion gap: 9 (ref 5–15)
BUN: 27 mg/dL — ABNORMAL HIGH (ref 8–23)
BUN: 31 mg/dL — ABNORMAL HIGH (ref 8–23)
CO2: 26 mmol/L (ref 22–32)
CO2: 30 mmol/L (ref 22–32)
Calcium: 9.9 mg/dL (ref 8.9–10.3)
Calcium: 9.8 mg/dL (ref 8.9–10.3)
Chloride: 98 mmol/L (ref 98–111)
Chloride: 96 mmol/L — ABNORMAL LOW (ref 98–111)
Creatinine, Ser: 1.39 mg/dL — ABNORMAL HIGH (ref 0.61–1.24)
Creatinine, Ser: 1.46 mg/dL — ABNORMAL HIGH (ref 0.61–1.24)
GFR, Estimated: 53 mL/min — ABNORMAL LOW (ref >60)
GFR, Estimated: 50 mL/min — ABNORMAL LOW (ref >60)
Glucose, Bld: 150 mg/dL — ABNORMAL HIGH (ref 70–99)
Glucose, Bld: 126 mg/dL — ABNORMAL HIGH (ref 70–99)
Potassium: 4.7 mmol/L (ref 3.5–5.1)
Potassium: 5 mmol/L (ref 3.5–5.1)
Sodium: 137 mmol/L (ref 135–145)
Sodium: 135 mmol/L (ref 135–145)

## 2021-08-09 LAB — GLUCOSE, CAPILLARY
Glucose-Capillary: 149 mg/dL — ABNORMAL HIGH (ref 70–99)
Glucose-Capillary: 147 mg/dL — ABNORMAL HIGH (ref 70–99)

## 2021-08-09 MED ORDER — BISACODYL 10 MG RE SUPP
10.0000 mg | Freq: Every day | RECTAL | Status: DC | PRN
Start: 2021-08-09 — End: 2021-08-31

## 2021-08-09 MED ORDER — METHOCARBAMOL 500 MG PO TABS: 500.0000 mg | ORAL_TABLET | Freq: Four times a day (QID) | ORAL | Status: DC | PRN

## 2021-08-09 MED ORDER — ACETAMINOPHEN 325 MG PO TABS
325.0000 mg | ORAL_TABLET | ORAL | Status: DC | PRN
  Administered 2021-08-18 – 2021-08-19 (×4): 650 mg via ORAL
  Filled 2021-08-09 (×6): qty 2

## 2021-08-09 MED ORDER — DOCUSATE SODIUM 100 MG PO CAPS
100.0000 mg | ORAL_CAPSULE | Freq: Two times a day (BID) | ORAL | Status: DC
  Administered 2021-08-09 – 2021-08-31 (×40): 100 mg via ORAL
  Filled 2021-08-09 (×45): qty 1

## 2021-08-09 MED ORDER — ATORVASTATIN CALCIUM 10 MG PO TABS
20.0000 mg | ORAL_TABLET | Freq: Every day | ORAL | Status: DC
  Administered 2021-08-10 – 2021-08-31 (×22): 20 mg via ORAL
  Filled 2021-08-09 (×22): qty 2

## 2021-08-09 MED ORDER — PROCHLORPERAZINE MALEATE 5 MG PO TABS: 5.0000 mg | ORAL_TABLET | Freq: Four times a day (QID) | ORAL | Status: DC | PRN

## 2021-08-09 MED ORDER — LABETALOL HCL 200 MG PO TABS
300.0000 mg | ORAL_TABLET | Freq: Two times a day (BID) | ORAL | Status: DC
  Administered 2021-08-09 – 2021-08-31 (×44): 300 mg via ORAL
  Filled 2021-08-09 (×44): qty 2

## 2021-08-09 MED ORDER — MAGNESIUM OXIDE -MG SUPPLEMENT 400 (240 MG) MG PO TABS
400.0000 mg | ORAL_TABLET | Freq: Every day | ORAL | Status: DC
  Administered 2021-08-10 – 2021-08-15 (×6): 400 mg via ORAL
  Filled 2021-08-09 (×6): qty 1

## 2021-08-09 MED ORDER — FLEET ENEMA 7-19 GM/118ML RE ENEM: 1.0000 | ENEMA | Freq: Once | RECTAL | Status: DC | PRN

## 2021-08-09 MED ORDER — DIPHENHYDRAMINE HCL 12.5 MG/5ML PO ELIX: 12.5000 mg | ORAL_SOLUTION | Freq: Four times a day (QID) | ORAL | Status: DC | PRN

## 2021-08-09 MED ORDER — GABAPENTIN 300 MG PO CAPS
300.0000 mg | ORAL_CAPSULE | Freq: Two times a day (BID) | ORAL | Status: DC
  Administered 2021-08-09 – 2021-08-31 (×44): 300 mg via ORAL
  Filled 2021-08-09 (×44): qty 1

## 2021-08-09 MED ORDER — INSULIN ASPART 100 UNIT/ML IJ SOLN: 0.0000 [IU] | Freq: Every day | INTRAMUSCULAR | Status: DC

## 2021-08-09 MED ORDER — PHENOL 1.4 % MT LIQD: 1.0000 | OROMUCOSAL | Status: DC | PRN

## 2021-08-09 MED ORDER — TRAZODONE HCL 50 MG PO TABS
25.0000 mg | ORAL_TABLET | Freq: Every evening | ORAL | Status: DC | PRN
  Administered 2021-08-09 – 2021-08-17 (×3): 50 mg via ORAL
  Filled 2021-08-09 (×5): qty 1

## 2021-08-09 MED ORDER — GUAIFENESIN-DM 100-10 MG/5ML PO SYRP: 5.0000 mL | ORAL_SOLUTION | Freq: Four times a day (QID) | ORAL | Status: DC | PRN

## 2021-08-09 MED ORDER — TAMSULOSIN HCL 0.4 MG PO CAPS
0.4000 mg | ORAL_CAPSULE | Freq: Every day | ORAL | Status: DC
  Administered 2021-08-10 – 2021-08-31 (×22): 0.4 mg via ORAL
  Filled 2021-08-09 (×22): qty 1

## 2021-08-09 MED ORDER — OXYCODONE HCL 5 MG PO TABS
10.0000 mg | ORAL_TABLET | ORAL | Status: DC | PRN
  Administered 2021-08-10: 15 mg via ORAL
  Administered 2021-08-11: 10 mg via ORAL
  Administered 2021-08-12 – 2021-08-13 (×4): 15 mg via ORAL
  Administered 2021-08-14: 10 mg via ORAL
  Administered 2021-08-14 – 2021-08-22 (×21): 15 mg via ORAL
  Administered 2021-08-22 – 2021-08-24 (×4): 10 mg via ORAL
  Administered 2021-08-24: 15 mg via ORAL
  Administered 2021-08-25: 10 mg via ORAL
  Administered 2021-08-25 – 2021-08-28 (×5): 15 mg via ORAL
  Administered 2021-08-28: 10 mg via ORAL
  Administered 2021-08-29 (×2): 15 mg via ORAL
  Administered 2021-08-30: 10 mg via ORAL
  Filled 2021-08-09: qty 2
  Filled 2021-08-09 (×3): qty 3
  Filled 2021-08-09: qty 2
  Filled 2021-08-09 (×4): qty 3
  Filled 2021-08-09 (×2): qty 2
  Filled 2021-08-09: qty 3
  Filled 2021-08-09: qty 2
  Filled 2021-08-09 (×16): qty 3
  Filled 2021-08-09: qty 2
  Filled 2021-08-09 (×3): qty 3
  Filled 2021-08-09: qty 2
  Filled 2021-08-09 (×5): qty 3
  Filled 2021-08-09: qty 2
  Filled 2021-08-09 (×3): qty 3

## 2021-08-09 MED ORDER — METHOCARBAMOL 500 MG PO TABS
500.0000 mg | ORAL_TABLET | Freq: Four times a day (QID) | ORAL | Status: DC | PRN
  Administered 2021-08-09 – 2021-08-29 (×14): 500 mg via ORAL
  Filled 2021-08-09 (×15): qty 1

## 2021-08-09 MED ORDER — ALUM & MAG HYDROXIDE-SIMETH 200-200-20 MG/5ML PO SUSP: 30.0000 mL | ORAL | Status: DC | PRN

## 2021-08-09 MED ORDER — AMILORIDE HCL 5 MG PO TABS
5.0000 mg | ORAL_TABLET | ORAL | Status: DC
  Administered 2021-08-10 – 2021-08-31 (×10): 5 mg via ORAL
  Filled 2021-08-09 (×10): qty 1

## 2021-08-09 MED ORDER — INSULIN ASPART 100 UNIT/ML IJ SOLN
0.0000 [IU] | Freq: Three times a day (TID) | INTRAMUSCULAR | Status: DC
  Administered 2021-08-10 (×3): 1 [IU] via SUBCUTANEOUS
  Administered 2021-08-11: 2 [IU] via SUBCUTANEOUS
  Administered 2021-08-12 – 2021-08-29 (×9): 1 [IU] via SUBCUTANEOUS

## 2021-08-09 MED ORDER — AMLODIPINE BESYLATE 10 MG PO TABS
10.0000 mg | ORAL_TABLET | Freq: Every day | ORAL | Status: DC
  Administered 2021-08-10 – 2021-08-31 (×22): 10 mg via ORAL
  Filled 2021-08-09 (×22): qty 1

## 2021-08-09 MED ORDER — SENNA 8.6 MG PO TABS
2.0000 | ORAL_TABLET | Freq: Every day | ORAL | Status: DC
  Administered 2021-08-10 – 2021-08-31 (×22): 17.2 mg via ORAL
  Filled 2021-08-09 (×22): qty 2

## 2021-08-09 MED ORDER — MENTHOL 3 MG MT LOZG
1.0000 | LOZENGE | OROMUCOSAL | Status: DC | PRN
Start: 2021-08-09 — End: 2021-08-31

## 2021-08-09 MED ORDER — PROCHLORPERAZINE 25 MG RE SUPP: 12.5000 mg | Freq: Four times a day (QID) | RECTAL | Status: DC | PRN

## 2021-08-09 MED ORDER — PROCHLORPERAZINE EDISYLATE 10 MG/2ML IJ SOLN: 5.0000 mg | Freq: Four times a day (QID) | INTRAMUSCULAR | Status: DC | PRN

## 2021-08-09 MED ORDER — LEFLUNOMIDE 20 MG PO TABS
20.0000 mg | ORAL_TABLET | Freq: Every day | ORAL | Status: DC
  Administered 2021-08-10 – 2021-08-31 (×21): 20 mg via ORAL
  Filled 2021-08-09 (×24): qty 1

## 2021-08-09 MED ORDER — DOCUSATE SODIUM 100 MG PO CAPS: 100.0000 mg | ORAL_CAPSULE | Freq: Two times a day (BID) | ORAL | 0 refills | Status: DC

## 2021-08-09 MED ORDER — POLYETHYLENE GLYCOL 3350 17 G PO PACK: 17.0000 g | PACK | Freq: Every day | ORAL | 0 refills | Status: DC | PRN

## 2021-08-09 MED ORDER — POLYETHYLENE GLYCOL 3350 17 G PO PACK: 17.0000 g | PACK | Freq: Every day | ORAL | Status: DC | PRN

## 2021-08-09 NOTE — H&P (Signed)
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     HPI: Shawn Decker is a 76 year old male with history of T2DM, HTN, CKD, RA (Dr. Essie Hart), cervical decompression, peripheral neuropathy who was admitted on 08/04/21 with 2-3 weeks history of numbness, weakness, tingling with inability to walk and inability to raise his arms. MRI C spine showed C3/C4 and C5/C5 ACDF with significant adjacent segment disease, spinal stenosis and spinal cord mass effect with abnormal cord signal at C2/C3 and C5/6 adjacent segments. He was evaluated by Dr. Ellene Route and underwent cervical decompression C2/3 and C5/6 on the same day. Post op continues to be myelopathic with BUE/BLE weakness with ataxia as well as coughing to clear secretions. Therapy has been working with patient who continues to be limited by BLE instability, limited ambulation difficulty advancing BLE after 5- 10' as well as difficulty with ADLs. CIR recommended due to functional decline.   Pt reports he's constantly clearing his throat since surgery- Doesn't feel like anything has gotten stuck Not getting strong as fast as expected- so doesn't feel good about this.  Pain 3-5/10 without meds and 2/10 with meds. Feels weak still esp with knee buckling.  Also numbness in hands and feet B/L  LBM last night0 and has condom cath/voiding OK, but has severe urgency- hasn't gotten worse, but now cannot get to bathroom in time.   Hard collar to continue for a few weeks and staples to be removed in 2 weeks l   Review of Systems  Constitutional:  Negative for chills and fever.  HENT:  Negative for hearing loss and tinnitus.   Eyes:  Negative for blurred vision and double vision.  Respiratory:  Negative for cough and shortness of breath.   Cardiovascular:  Negative for chest pain and palpitations.  Gastrointestinal:  Negative for abdominal pain, diarrhea and  heartburn.       Swallowing /clearing throat constantly  Musculoskeletal:  Positive for falls (near falls due to balance/legs giving away) and neck pain.  Neurological:  Positive for sensory change (numbness/tingling bilateral and feet to knees.BUE worse few weeks PTA.). Negative for dizziness and headaches.  Psychiatric/Behavioral:  The patient does not have insomnia.   All other systems reviewed and are negative.   Past Medical History:  Diagnosis Date   Cervical stenosis of spine    Diabetes mellitus without complication (HCC)    DNR (do not resuscitate)    Hyperlipidemia    Hypertension    Stage 3a chronic kidney disease (CKD) ([place])     Past Surgical History:  Procedure Laterality Date   ANTERIOR CERVICAL DECOMP/DISCECTOMY FUSION     C2-3, 3-4   EYE SURGERY     POSTERIOR CERVICAL FUSION/FORAMINOTOMY N/A 08/04/2021   Procedure: POSTERIOR CERVICAL FUSION/FORAMINOTOMY FIXATION Cervical two -Cervical six;  Surgeon: Kristeen Miss, MD;  Location: [facility];  Service: Neurosurgery;  Laterality: N/A;    Family History  Problem Relation Age of Onset   Ovarian cancer Sister    Lung cancer Sister     Social History:  Widowed. He reports that he has quit smoking--20 years ago.  He has never used smokeless tobacco. He reports current alcohol use--couple of drinks per week. He reports that he does not use drugs.   Allergies: No Known Allergies   Medications Prior to Admission  Medication Sig Dispense Refill   aMILoride (MIDAMOR) 5 MG tablet Take 5 mg  by mouth every Monday, Wednesday, and Friday.     amLODipine (NORVASC) 10 MG tablet Take 10 mg by mouth daily.     atorvastatin (LIPITOR) 20 MG tablet Take by mouth.     HYDROcodone-acetaminophen (NORCO/VICODIN) 5-325 MG tablet Take 1 tablet by mouth every 6 (six) hours as needed. 8 tablet 0   ibuprofen (ADVIL,MOTRIN) 400 MG tablet Take 1 tablet (400 mg total) by mouth every 6 (six) hours as needed for mild pain or moderate pain. 30 tablet  0   labetalol (NORMODYNE) 300 MG tablet Take 300 mg by mouth 2 (two) times daily.     leflunomide (ARAVA) 20 MG tablet Take 1 tablet by mouth daily.     losartan (COZAAR) 100 MG tablet Take 100 mg by mouth daily.     magnesium oxide (MAG-OX) 400 MG tablet Take by mouth.     tadalafil (CIALIS) 5 MG tablet Take by mouth.     tamsulosin (FLOMAX) 0.4 MG CAPS capsule Take 0.4 mg by mouth.     gabapentin (NEURONTIN) 300 MG capsule Take 1 capsule (300 mg total) by mouth 2 (two) times daily. 60 capsule 0      Home: Home Living Family/patient expects to be discharged to:: Private residence Living Arrangements: Alone Available Help at Discharge: Family, Friend(s), Available PRN/intermittently Type of Home: House Home Access: Stairs to enter [employer] of Steps: 4 in front, 3 in garage Entrance Stairs-Rails: None Home Layout: Two level Alternate Level Stairs-Number of Steps: has stairlift Bathroom Shower/Tub: Health visitor: Handicapped height Bathroom Accessibility: Yes Home Equipment: Information systems manager - built in, Agricultural consultant (2 wheels), [employer] - single point, BSC/3in1   Functional History: Prior Function Prior Level of Function : Independent/Modified Independent Mobility Comments: RW use the past 2 weeks, 2 LOB episodes in the past 6 months ADLs Comments: independent with modifications, sponge bathed  Functional Status:  Mobility: Bed Mobility Overal bed mobility: Needs Assistance Bed Mobility: Sidelying to Sit, Rolling Rolling: Min assist Sidelying to sit: Mod assist Sit to sidelying: Mod assist General bed mobility comments: verbal cues for log roll technique and assistance with raising trunk Transfers Overall transfer level: Needs assistance Equipment used: Ambulation equipment used Transfers: Sit to/from Stand, Bed to chair/wheelchair/BSC Sit to Stand: Mod assist Bed to/from chair/wheelchair/BSC transfer type:: Via Lift equipment Stand pivot  transfers: Max assist, +2 safety/equipment Step pivot transfers: Mod assist Transfer via Lift Equipment: [employer] transfer comment: mod assist to stand from EOB into stedy and from stedy pads Ambulation/Gait Ambulation/Gait assistance: +2 safety/equipment, Max assist Gait Distance (Feet): 5 Feet Assistive device: Rolling walker (2 wheels) Gait Pattern/deviations: Step-to pattern, Decreased stride length, Shuffle, Decreased dorsiflexion - right, Decreased dorsiflexion - left, Knees buckling, Knee flexed in stance - right, Knee flexed in stance - left General Gait Details: Pt with very flexed knees throughout OOB mobility. Increased difficulty improving knee extension and posture. Up to max assist provided for balance support and safety. Gait velocity: Decreased Gait velocity interpretation: <1.31 ft/sec, indicative of household ambulator Pre-gait activities: Gait training terminated, and opted for sit<>stand to bed rail (raised). Pt only able to tolerate 1 stand.    ADL: ADL Overall ADL's : Needs assistance/impaired Eating/Feeding: Set up, Sitting Eating/Feeding Details (indicate cue type and reason): in recliner Grooming: Wash/dry hands, Wash/dry face, Set up, Sitting Grooming Details (indicate cue type and reason): in recliner Upper Body Bathing: Sitting, Moderate assistance Lower Body Bathing: Maximal assistance, Sitting/lateral leans Upper Body Dressing : Minimal assistance, Sitting Lower  Body Dressing: With adaptive equipment, Sitting/lateral leans, Minimal assistance Lower Body Dressing Details (indicate cue type and reason): to don sock Toilet Transfer: Moderate assistance, +2 for physical assistance, Maximal assistance, BSC/3in1, Stand-pivot (STEDY) Toilet Transfer Details (indicate cue type and reason): BSC with use of stedy Toileting- Clothing Manipulation and Hygiene: Maximal assistance, Sitting/lateral lean Functional mobility during ADLs: Moderate assistance, +2 for  physical assistance General ADL Comments: transferred to recliner with Stedy to complete breakfast and perform grooming  Cognition: Cognition Overall Cognitive Status: Within Functional Limits for tasks assessed Orientation Level: Oriented X4 Cognition Arousal/Alertness: Awake/alert Behavior During Therapy: WFL for tasks assessed/performed Overall Cognitive Status: Within Functional Limits for tasks assessed General Comments: eager to participate   Blood pressure (!) 150/67, pulse 80, temperature 98.3 F (36.8 C), temperature source Oral, resp. rate 16, height 5\' 9"  (1.753 m), weight 81.6 kg, SpO2 98 %. Physical Exam Vitals and nursing note reviewed.  Constitutional:      Appearance: Normal appearance.     Comments: Aspen collar in place Sitting up in bedside chair; appropriate, appears younger than stated age; appropriate weight, NAD, watching TV  HENT:     Head: Normocephalic and atraumatic.     Comments: No facial droop Tongue midline    Right Ear: External ear normal.     Left Ear: External ear normal.     Nose: Nose normal. No congestion.     Mouth/Throat:     Mouth: Mucous membranes are moist.     Pharynx: Oropharynx is clear. No oropharyngeal exudate.     Comments: Constantly clearing throat- >30x during exam Eyes:     General:        Right eye: No discharge.        Left eye: No discharge.     Extraocular Movements: Extraocular movements intact.  Cardiovascular:     Rate and Rhythm: Normal rate and regular rhythm.     Heart sounds: Normal heart sounds. No murmur heard.    No gallop.  Pulmonary:     Effort: Pulmonary effort is normal. No respiratory distress.     Breath sounds: Normal breath sounds. No wheezing, rhonchi or rales.  Abdominal:     General: Abdomen is flat. Bowel sounds are normal. There is no distension.     Palpations: Abdomen is soft.     Tenderness: There is no abdominal tenderness.     Comments: Normoactive BS  Genitourinary:    Comments:  Condom cath in place- medium amber urine Musculoskeletal:     Cervical back: Neck supple. No tenderness.     Comments: UE strength deltoid 5-/5; biceps 4+/5; triceps 5-/5; WE 5-/5; Grip 4+/5; and FA 4/5 B/L  LE strength HF 4/5; KE/KF 4+/5; DF 4-/5 and PF 4+/5 B/L  B/L no effusions of B/L knees  Skin:    General: Skin is warm and dry.     Comments: Multiple healed abrasions bilateral shins? Dressing posterior neck with dry dressing with dry blood noted. Has staples intact in posterior neck incision L hand IV- looks OK  Neurological:     Mental Status: He is alert and oriented to person, place, and time.     Comments: Speech clear with good breath support but cleared throat multiple times during conversation.  Decreased to light touch in tips of fingers and toes- but not in dermatomal distribution B/L  Absent DTRs in LE's B/L   Psychiatric:        Mood and Affect: Mood normal.  Behavior: Behavior normal.     Results for orders placed or performed during the hospital encounter of 08/03/21 (from the past 48 hour(s))  Basic metabolic panel     Status: Abnormal   Collection Time: 08/09/21  2:05 AM  Result Value Ref Range   Sodium 137 135 - 145 mmol/L   Potassium 4.7 3.5 - 5.1 mmol/L   Chloride 98 98 - 111 mmol/L   CO2 26 22 - 32 mmol/L   Glucose, Bld 150 (H) 70 - 99 mg/dL    Comment: Glucose reference range applies only to samples taken after fasting for at least 8 hours.   BUN 27 (H) 8 - 23 mg/dL   Creatinine, Ser 5.17 (H) 0.61 - 1.24 mg/dL   Calcium 9.9 8.9 - 61.6 mg/dL   GFR, Estimated 53 (L) >60 mL/min    Comment: (NOTE) Calculated using the CKD-EPI Creatinine Equation (2021)    Anion gap 13 5 - 15    Comment: Performed at [facility], [address], [place], [place] [address]   No results found.    Blood pressure (!) 150/67, pulse 80, temperature 98.3 F (36.8 C), temperature source Oral, resp. rate 16, height 5\' 9"  (1.753 m), weight 81.6 kg, SpO2 98  %.  Medical Problem List and Plan: 1. Functional deficits secondary to cervical myelopathy  -patient may  shower- with dressing over cervical incision  -ELOS/Goals: 14-15 days- supervision 2.  Antithrombotics: -DVT/anticoagulation:  Mechanical: Sequential compression devices, below knee Bilateral lower extremities -will see when can start lovenox-  -antiplatelet therapy: N/A 3. Pain Management: Oxycodone prn. Gabapentin 300 mg bid for neuropathy. Pain adequate with pain meds 4. Mood/Sleep: LCSW to follow for evaluation and assist. Has been sleeping well.   -antipsychotic agents:  N/A 5. Neuropsych/cognition: This patient is capable of making decisions on his own behalf. 6. Skin/Wound Care: Monitor incision for healing.  7. Fluids/Electrolytes/Nutrition: Monitor I/O. Check CMET in am.  8. Cervical myelopathy s/p decompression C2/3 and C5/6:  --Collar to be on at all times.   --Staples to come out in 2 weeks (July 1st?)  w/NS follow up  --dysphagia?  STLPfor swallow evaluation.  9. HTN: Monitor BP TID. Continue Midamor, normodyne and amlodipine  10. RA: On Arava per Dr. 10-07-1985 in [facility].  11. CKD 3 a:  BUN/SCr- 27/1.75 at admission-->improved since admisison --monitor with serial checks. Avoid nephrotoxic medications.   12. Acute on chronic anemia: Recheck in am.  13. Urinary urgency with incontinence- using condom cath since has severe urgency- will likely need timed voiding during day.    I have personally performed a face to face diagnostic evaluation of this patient and formulated the key components of the plan.  Additionally, I have personally reviewed laboratory data, imaging studies, as well as relevant notes and concur with the physician assistant's documentation above.   The patient's status has not changed from the original H&P.  Any changes in documentation from the acute care chart have been noted above.     02-15-2004, PA-C 08/09/2021

## 2021-08-09 NOTE — Progress Notes (Signed)
Inpatient Rehabilitation Admission Medication Review by a Pharmacist  A complete drug regimen review was completed for this patient to identify any potential clinically significant medication issues.  High Risk Drug Classes Is patient taking? Indication by Medication  Antipsychotic Yes Compazine- N/V  Anticoagulant No   Antibiotic No   Opioid Yes OxyIR- acute pain  Antiplatelet No   Hypoglycemics/insulin No   Vasoactive Medication Yes Norvasc, labetolol, losartan, amiloride- hypertension Flomax- BPH  Chemotherapy No   Other Yes Lipitor- HLD Gabapentin- neuropathic pain Robaxin- muscle spasms Arava- RA Trazodone- sleep     Type of Medication Issue Identified Description of Issue Recommendation(s)  Drug Interaction(s) (clinically significant)     Duplicate Therapy     Allergy     No Medication Administration End Date     Incorrect Dose     Additional Drug Therapy Needed     Significant med changes from prior encounter (inform family/care partners about these prior to discharge).    Other  PTA meds: Cialis 5 mg daily  Restart PTA meds when and if necessary during CIR admission or at time of discharge, if warranted    Clinically significant medication issues were identified that warrant physician communication and completion of prescribed/recommended actions by midnight of the next day:  No  Time spent performing this drug regimen review (minutes):  30   Aspasia Rude BS, PharmD, BCPS Clinical Pharmacist 08/09/2021 2:56 PM  Contact: [phone] after 3 PM  "Be curious, not judgmental..." -Debbora Dus

## 2021-08-09 NOTE — Progress Notes (Signed)
Patient arrived from [facility], Appears alert and makes no complaint of pain

## 2021-08-09 NOTE — H&P (Signed)
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       HPI: Shawn Decker is a 76 year old male with history of T2DM, HTN, CKD, RA (Dr. Essie Hart), cervical decompression, peripheral neuropathy who was admitted on 08/04/21 with 2-3 weeks history of numbness, weakness, tingling with inability to walk and inability to raise his arms. MRI C spine showed C3/C4 and C5/C5 ACDF with significant adjacent segment disease, spinal stenosis and spinal cord mass effect with abnormal cord signal at C2/C3 and C5/6 adjacent segments. He was evaluated by Dr. Ellene Route and underwent cervical decompression C2/3 and C5/6 on the same day. Post op continues to be myelopathic with BUE/BLE weakness with ataxia as well as coughing to clear secretions. Therapy has been working with patient who continues to be limited by BLE instability, limited ambulation difficulty advancing BLE after 5- 10' as well as difficulty with ADLs. CIR recommended due to functional decline.    Pt reports he's constantly clearing his throat since surgery- Doesn't feel like anything has gotten stuck Not getting strong as fast as expected- so doesn't feel good about this.  Pain 3-5/10 without meds and 2/10 with meds. Feels weak still esp with knee buckling.  Also numbness in hands and feet B/L  LBM last night0 and has condom cath/voiding OK, but has severe urgency- hasn't gotten worse, but now cannot get to bathroom in time.    Hard collar to continue for a few weeks and staples to be removed in 2 weeks l     Review of Systems  Constitutional:  Negative for chills and fever.  HENT:  Negative for hearing loss and tinnitus.   Eyes:  Negative for blurred vision and double vision.  Respiratory:  Negative for cough and shortness of breath.   Cardiovascular:  Negative for chest pain and palpitations.  Gastrointestinal:  Negative for abdominal pain,  diarrhea and heartburn.       Swallowing /clearing throat constantly  Musculoskeletal:  Positive for falls (near falls due to balance/legs giving away) and neck pain.  Neurological:  Positive for sensory change (numbness/tingling bilateral and feet to knees.BUE worse few weeks PTA.). Negative for dizziness and headaches.  Psychiatric/Behavioral:  The patient does not have insomnia.   All other systems reviewed and are negative.         Past Medical History:  Diagnosis Date   Cervical stenosis of spine     Diabetes mellitus without complication (HCC)     DNR (do not resuscitate)     Hyperlipidemia     Hypertension     Stage 3a chronic kidney disease (CKD) ([facility])             Past Surgical History:  Procedure Laterality Date   ANTERIOR CERVICAL DECOMP/DISCECTOMY FUSION        C2-3, 3-4   EYE SURGERY       POSTERIOR CERVICAL FUSION/FORAMINOTOMY N/A 08/04/2021    Procedure: POSTERIOR CERVICAL FUSION/FORAMINOTOMY FIXATION Cervical two -Cervical six;  Surgeon: Kristeen Miss, MD;  Location: [facility];  Service: Neurosurgery;  Laterality: N/A;           Family History  Problem Relation Age of Onset   Ovarian cancer Sister     Lung cancer Sister        Social History:  Widowed. He reports that he has quit smoking--20 years ago.  He has  never used smokeless tobacco. He reports current alcohol use--couple of drinks per week. He reports that he does not use drugs.     Allergies: No Known Allergies           Medications Prior to Admission  Medication Sig Dispense Refill   aMILoride (MIDAMOR) 5 MG tablet Take 5 mg by mouth every Monday, Wednesday, and Friday.       amLODipine (NORVASC) 10 MG tablet Take 10 mg by mouth daily.       atorvastatin (LIPITOR) 20 MG tablet Take by mouth.       HYDROcodone-acetaminophen (NORCO/VICODIN) 5-325 MG tablet Take 1 tablet by mouth every 6 (six) hours as needed. 8 tablet 0   ibuprofen (ADVIL,MOTRIN) 400 MG tablet Take 1 tablet (400 mg total) by mouth  every 6 (six) hours as needed for mild pain or moderate pain. 30 tablet 0   labetalol (NORMODYNE) 300 MG tablet Take 300 mg by mouth 2 (two) times daily.       leflunomide (ARAVA) 20 MG tablet Take 1 tablet by mouth daily.       losartan (COZAAR) 100 MG tablet Take 100 mg by mouth daily.       magnesium oxide (MAG-OX) 400 MG tablet Take by mouth.       tadalafil (CIALIS) 5 MG tablet Take by mouth.       tamsulosin (FLOMAX) 0.4 MG CAPS capsule Take 0.4 mg by mouth.       gabapentin (NEURONTIN) 300 MG capsule Take 1 capsule (300 mg total) by mouth 2 (two) times daily. 60 capsule 0          Home: Home Living Family/patient expects to be discharged to:: Private residence Living Arrangements: Alone Available Help at Discharge: Family, Friend(s), Available PRN/intermittently Type of Home: House Home Access: Stairs to enter [employer] of Steps: 4 in front, 3 in garage Entrance Stairs-Rails: None Home Layout: Two level Alternate Level Stairs-Number of Steps: has stairlift Bathroom Shower/Tub: Multimedia programmer: Handicapped height Bathroom Accessibility: Yes Home Equipment: Civil engineer, contracting - built in, Conservation officer, nature (2 wheels), [employer] - single point, BSC/3in1   Functional History: Prior Function Prior Level of Function : Independent/Modified Independent Mobility Comments: RW use the past 2 weeks, 2 LOB episodes in the past 6 months ADLs Comments: independent with modifications, sponge bathed   Functional Status:  Mobility: Bed Mobility Overal bed mobility: Needs Assistance Bed Mobility: Sidelying to Sit, Rolling Rolling: Min assist Sidelying to sit: Mod assist Sit to sidelying: Mod assist General bed mobility comments: verbal cues for log roll technique and assistance with raising trunk Transfers Overall transfer level: Needs assistance Equipment used: Ambulation equipment used Transfers: Sit to/from Stand, Bed to chair/wheelchair/BSC Sit to Stand: Mod  assist Bed to/from chair/wheelchair/BSC transfer type:: Via Lift equipment Stand pivot transfers: Max assist, +2 safety/equipment Step pivot transfers: Mod assist Transfer via Lift Equipment: [employer] transfer comment: mod assist to stand from EOB into stedy and from stedy pads Ambulation/Gait Ambulation/Gait assistance: +2 safety/equipment, Max assist Gait Distance (Feet): 5 Feet Assistive device: Rolling walker (2 wheels) Gait Pattern/deviations: Step-to pattern, Decreased stride length, Shuffle, Decreased dorsiflexion - right, Decreased dorsiflexion - left, Knees buckling, Knee flexed in stance - right, Knee flexed in stance - left General Gait Details: Pt with very flexed knees throughout OOB mobility. Increased difficulty improving knee extension and posture. Up to max assist provided for balance support and safety. Gait velocity: Decreased Gait velocity interpretation: <1.31 ft/sec, indicative of household ambulator  Pre-gait activities: Gait training terminated, and opted for sit<>stand to bed rail (raised). Pt only able to tolerate 1 stand.   ADL: ADL Overall ADL's : Needs assistance/impaired Eating/Feeding: Set up, Sitting Eating/Feeding Details (indicate cue type and reason): in recliner Grooming: Wash/dry hands, Wash/dry face, Set up, Sitting Grooming Details (indicate cue type and reason): in recliner Upper Body Bathing: Sitting, Moderate assistance Lower Body Bathing: Maximal assistance, Sitting/lateral leans Upper Body Dressing : Minimal assistance, Sitting Lower Body Dressing: With adaptive equipment, Sitting/lateral leans, Minimal assistance Lower Body Dressing Details (indicate cue type and reason): to don sock Toilet Transfer: Moderate assistance, +2 for physical assistance, Maximal assistance, BSC/3in1, Stand-pivot (STEDY) Toilet Transfer Details (indicate cue type and reason): BSC with use of stedy Toileting- Clothing Manipulation and Hygiene: Maximal assistance,  Sitting/lateral lean Functional mobility during ADLs: Moderate assistance, +2 for physical assistance General ADL Comments: transferred to recliner with Stedy to complete breakfast and perform grooming   Cognition: Cognition Overall Cognitive Status: Within Functional Limits for tasks assessed Orientation Level: Oriented X4 Cognition Arousal/Alertness: Awake/alert Behavior During Therapy: WFL for tasks assessed/performed Overall Cognitive Status: Within Functional Limits for tasks assessed General Comments: eager to participate     Blood pressure (!) 150/67, pulse 80, temperature 98.3 F (36.8 C), temperature source Oral, resp. rate 16, height 5\' 9"  (1.753 m), weight 81.6 kg, SpO2 98 %. Physical Exam Vitals and nursing note reviewed.  Constitutional:      Appearance: Normal appearance.     Comments: Aspen collar in place Sitting up in bedside chair; appropriate, appears younger than stated age; appropriate weight, NAD, watching TV  HENT:     Head: Normocephalic and atraumatic.     Comments: No facial droop Tongue midline    Right Ear: External ear normal.     Left Ear: External ear normal.     Nose: Nose normal. No congestion.     Mouth/Throat:     Mouth: Mucous membranes are moist.     Pharynx: Oropharynx is clear. No oropharyngeal exudate.     Comments: Constantly clearing throat- >30x during exam Eyes:     General:        Right eye: No discharge.        Left eye: No discharge.     Extraocular Movements: Extraocular movements intact.  Cardiovascular:     Rate and Rhythm: Normal rate and regular rhythm.     Heart sounds: Normal heart sounds. No murmur heard.    No gallop.  Pulmonary:     Effort: Pulmonary effort is normal. No respiratory distress.     Breath sounds: Normal breath sounds. No wheezing, rhonchi or rales.  Abdominal:     General: Abdomen is flat. Bowel sounds are normal. There is no distension.     Palpations: Abdomen is soft.     Tenderness: There is no  abdominal tenderness.     Comments: Normoactive BS  Genitourinary:    Comments: Condom cath in place- medium amber urine Musculoskeletal:     Cervical back: Neck supple. No tenderness.     Comments: UE strength deltoid 5-/5; biceps 4+/5; triceps 5-/5; WE 5-/5; Grip 4+/5; and FA 4/5 B/L  LE strength HF 4/5; KE/KF 4+/5; DF 4-/5 and PF 4+/5 B/L  B/L no effusions of B/L knees  Skin:    General: Skin is warm and dry.     Comments: Multiple healed abrasions bilateral shins? Dressing posterior neck with dry dressing with dry blood noted. Has staples intact in posterior neck  incision L hand IV- looks OK  Neurological:     Mental Status: He is alert and oriented to person, place, and time.     Comments: Speech clear with good breath support but cleared throat multiple times during conversation.  Decreased to light touch in tips of fingers and toes- but not in dermatomal distribution B/L  Absent DTRs in LE's B/L   Psychiatric:        Mood and Affect: Mood normal.        Behavior: Behavior normal.        Lab Results Last 48 Hours        Results for orders placed or performed during the hospital encounter of 08/03/21 (from the past 48 hour(s))  Basic metabolic panel     Status: Abnormal    Collection Time: 08/09/21  2:05 AM  Result Value Ref Range    Sodium 137 135 - 145 mmol/L    Potassium 4.7 3.5 - 5.1 mmol/L    Chloride 98 98 - 111 mmol/L    CO2 26 22 - 32 mmol/L    Glucose, Bld 150 (H) 70 - 99 mg/dL      Comment: Glucose reference range applies only to samples taken after fasting for at least 8 hours.    BUN 27 (H) 8 - 23 mg/dL    Creatinine, Ser 1.39 (H) 0.61 - 1.24 mg/dL    Calcium 9.9 8.9 - 10.3 mg/dL    GFR, Estimated 53 (L) >60 mL/min      Comment: (NOTE) Calculated using the CKD-EPI Creatinine Equation (2021)      Anion gap 13 5 - 15      Comment: Performed at [facility] [address], [place], [place] [address]      Imaging Results (Last 48 hours)  No results  found.         Blood pressure (!) 150/67, pulse 80, temperature 98.3 F (36.8 C), temperature source Oral, resp. rate 16, height 5\' 9"  (1.753 m), weight 81.6 kg, SpO2 98 %.   Medical Problem List and Plan: 1. Functional deficits secondary to cervical myelopathy             -patient may  shower- with dressing over cervical incision             -ELOS/Goals: 14-15 days- supervision 2.  Antithrombotics: -DVT/anticoagulation:  Mechanical: Sequential compression devices, below knee Bilateral lower extremities -will see when can start lovenox-             -antiplatelet therapy: N/A 3. Pain Management: Oxycodone prn. Gabapentin 300 mg bid for neuropathy. Pain adequate with pain meds 4. Mood/Sleep: LCSW to follow for evaluation and assist. Has been sleeping well.              -antipsychotic agents:  N/A 5. Neuropsych/cognition: This patient is capable of making decisions on his own behalf. 6. Skin/Wound Care: Monitor incision for healing.  7. Fluids/Electrolytes/Nutrition: Monitor I/O. Check CMET in am.  8. Cervical myelopathy s/p decompression C2/3 and C5/6:  --Collar to be on at all times.              --Staples to come out in 2 weeks (July 1st?)  w/NS follow up             --dysphagia?  STLPfor swallow evaluation.  9. HTN: Monitor BP TID. Continue Midamor, normodyne and amlodipine  10. RA: On Arava per Dr. Abner Greenspan in [place].  11. CKD 3 a:  BUN/SCr- 27/1.75 at admission-->improved since admisison --monitor with serial checks. Avoid nephrotoxic medications.   12. Acute on chronic anemia: Recheck in am.  13. Urinary urgency with incontinence- using condom cath since has severe urgency- will likely need timed voiding during day.      I have personally performed a face to face diagnostic evaluation of this patient and formulated the key components of the plan.  Additionally, I have personally reviewed laboratory data, imaging studies, as well as relevant notes and concur with the physician assistant's  documentation above.   The patient's status has not changed from the original H&P.  Any changes in documentation from the acute care chart have been noted above.       Jacquelynn Cree, PA-C 08/09/2021

## 2021-08-09 NOTE — Progress Notes (Signed)
Patient ID: Shawn Decker, male   DOB: [birth year], 76 y.o.   MRN: [id number] Patient's vital signs are stable His motor function appears to be slightly improved with better grips at 4+ out of 5 Patient notes only minimal changes or improvements in numbness in his hand Incision on his back is clean and dry He is in a hard cervical collar I will continue to follow on rehab

## 2021-08-09 NOTE — Progress Notes (Addendum)
Shawn Heys, MD  Physician CASE MANAGEMENT PMR Pre-admission     Signed Date of Service:  08/07/2021 12:52 PM  Related encounter: ED to Hosp-Admission (Current) from 08/03/2021 in [facility]   Signed                                                                                                                                                                                                                                                                                                                                                                                                                                                                                     PMR Admission Coordinator Pre-Admission Assessment   Patient: Shawn Decker is an 76 y.o., male MRN: [id number] DOB: August 22, 1945 Height: '5\' 9"'  (175.3 cm) Weight: 81.6 kg   Insurance Information HMO: yes     PPO:      PCP:      IPA:      80/20:      OTHER:  PRIMARY: [place] [employer]       Policy#: [id number]      Subscriber: pt CM Name: Dewitt Rota        Phone#:  [phone]     Fax#: [phone]  Lana with [employer] called with approval on 08/08/21 with approval 08/08/21-08/14/21 with update due 5/42/70 Pre-Cert#: [id number]            Employer:  Benefits:  Phone #:      Name:  Irene Shipper Date: 11/18/2020 - still active  Deductible: $226 ($226 met)  OOP Max: $8,300 ($464.65 met) CIR: $2,050 copay/admission  SNF: SNF: $0/day co-pay for days 1-20, $200/day co-pay for days 21-100; limited to 100 days/cal yr  Outpatient:  80% coverage; 20% co-insurance  Home Health:  100% coverage  DME: 80% coverage; 20% co-insurance  Providers: in network   SECONDARY: Medicaid [place] access  Policy#: [id number]   Phone#: [phone]   Financial Counselor:       Phone#:    The  "Data Collection Information Summary" for patients in Inpatient Rehabilitation Facilities with attached "Privacy Act [place] Records" was provided and verbally reviewed with: pt   Emergency Contact Information Contact Information       Name Relation Home Work Shawn Decker [phone]        carlin, attridge     [phone]    Lennix, Kneisel     [phone]           Current Medical History  Patient Admitting Diagnosis: Cervical Stenosis  Myelopathy   History of Present Illness: : Shawn Decker is a 76 y.o. male with medical history significant of HTN, HLD, and DM presenting to [facility] ED 6/17 with numbness/weakness/tingling.  He reports having a long h/o peripheral neuropathy.  He also remotely had C2-3 and 3-4 fusions. About 2 PTA he noticed tingling in his B fingers, symmetric.  He also noticed around the same time worsening tingling/numbness in his B feet.  It has progressed to weakness.  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 Neurosurgery performed  posterior cervical decompression C2-6 with posterior fixation C2-7. Pt. In [employer]. Pt. Also with hypertension, managed with  Amiloride, amlodipine, labetalol. 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 PT/OT saw Pt. And recommended CIR to assist return to PLOF.    Patient's medical record from [facility] has been reviewed by the rehabilitation admission coordinator and physician.   Past Medical History      Past Medical History:  Diagnosis Date   Cervical stenosis of spine     Diabetes mellitus without complication ([place])     DNR (do not resuscitate)     Hyperlipidemia     Hypertension     Stage 3a chronic kidney disease (CKD) ([place])        Has the patient had major surgery during 100 days prior to admission? Yes   Family History   family history is not on file.   Current Medications   Current Facility-Administered  Medications:    0.9 %  sodium chloride infusion, 250 mL, Intravenous, Continuous, Elsner, Henry, MD   acetaminophen (TYLENOL) tablet 650 mg, 650 mg, Oral, Q4H PRN **OR** acetaminophen (TYLENOL) suppository 650 mg, 650 mg, Rectal, Q4H PRN, Kristeen Miss, MD   alum & mag hydroxide-simeth (MAALOX/MYLANTA) 200-200-20 MG/5ML suspension 30 mL, 30 mL, Oral, Q6H PRN, Kristeen Miss, MD   aMILoride [facility]) tablet 5 mg, 5 mg, Oral, Q M,W,F, Kristeen Miss, MD, 5 mg at 08/08/21 0919   amLODipine (NORVASC) tablet 10 mg, 10 mg, Oral, Daily, Kristeen Miss, MD, 10 mg at 08/08/21 0920   atorvastatin (LIPITOR)  tablet 20 mg, 20 mg, Oral, Daily, Kristeen Miss, MD, 20 mg at 08/08/21 0919   bisacodyl (DULCOLAX) suppository 10 mg, 10 mg, Rectal, Daily PRN, Kristeen Miss, MD   docusate sodium (COLACE) capsule 100 mg, 100 mg, Oral, BID, Kristeen Miss, MD, 100 mg at 08/08/21 0919   gabapentin (NEURONTIN) capsule 300 mg, 300 mg, Oral, BID, Kristeen Miss, MD, 300 mg at 08/08/21 2208   hydrALAZINE (APRESOLINE) injection 10 mg, 10 mg, Intravenous, Q2H PRN, Kristeen Miss, MD   labetalol (NORMODYNE) tablet 300 mg, 300 mg, Oral, BID, Kristeen Miss, MD, 300 mg at 08/08/21 2208   leflunomide (ARAVA) tablet 20 mg, 20 mg, Oral, Daily, Kristeen Miss, MD, 20 mg at 08/08/21 0919   magnesium oxide (MAG-OX) tablet 400 mg, 400 mg, Oral, Daily, Kristeen Miss, MD, 400 mg at 08/08/21 0920   menthol-cetylpyridinium (CEPACOL) lozenge 3 mg, 1 lozenge, Oral, PRN **OR** phenol (CHLORASEPTIC) mouth spray 1 spray, 1 spray, Mouth/Throat, PRN, Kristeen Miss, MD   methocarbamol (ROBAXIN) tablet 500 mg, 500 mg, Oral, Q6H PRN, 500 mg at 08/08/21 2207 **OR** methocarbamol (ROBAXIN) 500 mg in dextrose 5 % 50 mL IVPB, 500 mg, Intravenous, Q6H PRN, Kristeen Miss, MD   morphine (PF) 2 MG/ML injection 2 mg, 2 mg, Intravenous, Q2H PRN, Kristeen Miss, MD   mupirocin ointment (BACTROBAN) 2 % 1 Application, 1 Application, Nasal, BID, Kristeen Miss, MD, 1  Application at 38/18/29 2217   ondansetron (ZOFRAN) tablet 4 mg, 4 mg, Oral, Q6H PRN **OR** ondansetron (ZOFRAN) injection 4 mg, 4 mg, Intravenous, Q6H PRN, Kristeen Miss, MD   oxyCODONE (Oxy IR/ROXICODONE) immediate release tablet 10-15 mg, 10-15 mg, Oral, Q4H PRN, Dessa Phi, DO, 15 mg at 08/09/21 9371   polyethylene glycol (MIRALAX / GLYCOLAX) packet 17 g, 17 g, Oral, Daily PRN, Kristeen Miss, MD   senna (SENOKOT) tablet 8.6 mg, 1 tablet, Oral, BID, Elsner, Henry, MD, 8.6 mg at 08/08/21 0919   sodium chloride flush (NS) 0.9 % injection 3 mL, 3 mL, Intravenous, Q12H, Elsner, Mallie Mussel, MD, 3 mL at 08/08/21 2214   sodium chloride flush (NS) 0.9 % injection 3 mL, 3 mL, Intravenous, PRN, Kristeen Miss, MD   sodium phosphate (FLEET) 7-19 GM/118ML enema 1 enema, 1 enema, Rectal, Once PRN, Kristeen Miss, MD   tamsulosin [facility]) capsule 0.4 mg, 0.4 mg, Oral, Daily, Kristeen Miss, MD, 0.4 mg at 08/08/21 0920   Patients Current Diet:  Diet Order                  Diet regular Room service appropriate? Yes; Fluid consistency: Thin  Diet effective now                         Precautions / Restrictions Precautions Precautions: Cervical Precaution Booklet Issued: No Precaution Comments: verbally reviewed 3/3 cervical precautions Cervical Brace: Hard collar, At all times Restrictions Weight Bearing Restrictions: No    Has the patient had 2 or more falls or a fall with injury in the past year? No   Prior Activity Level Community (5-7x/wk): Pt active in the community PTA   Prior Functional Level Self Care: Did the patient need help bathing, dressing, using the toilet or eating? Independent   Indoor Mobility: Did the patient need assistance with walking from room to room (with or without device)? Independent   Stairs: Did the patient need assistance with internal or external stairs (with or without device)? Independent   Functional Cognition: Did the patient need help planning  regular  tasks such as shopping or remembering to take medications? Independent   Patient Information Are you of Hispanic, Latino/a,or Spanish origin?: A. No, not of Hispanic, Latino/a, or Spanish origin What is your race?: B. Black or African American Do you need or want an interpreter to communicate with a doctor or health care staff?: 0. No   Patient's Response To:  Health Literacy and Transportation Is the patient able to respond to health literacy and transportation needs?: Yes Health Literacy - How often do you need to have someone help you when you read instructions, pamphlets, or other written material from your doctor or pharmacy?: Never In the past 12 months, has lack of transportation kept you from medical appointments or from getting medications?: No In the past 12 months, has lack of transportation kept you from meetings, work, or from getting things needed for daily living?: No   Home Assistive Devices / [place] Devices/Equipment: Radio producer (specify quad or straight) Home Equipment: Shower seat - built in, [employer] (2 wheels), [employer] - single point, BSC/3in1   Prior Device Use: Indicate devices/aids used by the patient prior to current illness, exacerbation or injury? None of the above   Current Functional Level Cognition   Overall Cognitive Status: Within Functional Limits for tasks assessed Orientation Level: Oriented X4    Extremity Assessment (includes Sensation/Coordination)   Upper Extremity Assessment: Generalized weakness  Lower Extremity Assessment: Defer to PT evaluation     ADLs   Overall ADL's : Needs assistance/impaired Eating/Feeding: Minimal assistance, Sitting Grooming: Minimal assistance, Sitting Upper Body Bathing: Sitting, Moderate assistance Lower Body Bathing: Maximal assistance, Sitting/lateral leans Upper Body Dressing : Minimal assistance, Sitting Lower Body Dressing: With adaptive equipment, Sitting/lateral leans, Minimal  assistance Lower Body Dressing Details (indicate cue type and reason): to don sock Toilet Transfer: Moderate assistance, +2 for physical assistance, Maximal assistance, BSC/3in1, Stand-pivot (STEDY) Toilet Transfer Details (indicate cue type and reason): BSC with use of stedy Toileting- Clothing Manipulation and Hygiene: Maximal assistance, Sitting/lateral lean Functional mobility during ADLs: Moderate assistance, +2 for physical assistance     Mobility   Overal bed mobility: Needs Assistance Bed Mobility: Sit to Sidelying Rolling: Min assist Sidelying to sit: Mod assist Sit to sidelying: Mod assist General bed mobility comments: VC's for optimal log roll technique. Pt was able to transition to/from EOB with step-by-step cues and assist to guide trunk to maintain spinal precautions.     Transfers   Overall transfer level: Needs assistance Equipment used: Rolling walker (2 wheels) Transfers: Sit to/from Stand, Bed to chair/wheelchair/BSC Sit to Stand: Mod assist, +2 safety/equipment Bed to/from chair/wheelchair/BSC transfer type:: Stand pivot Stand pivot transfers: Max assist, +2 safety/equipment Step pivot transfers: Mod assist Transfer via Lift Equipment: Stedy General transfer comment: Pt with increased difficulty taking pivotal steps this session. Increased assist from therapist to guide hips around to sit in the chair, and then again guide hips around from chair>bed at end of session.     Ambulation / Gait / Stairs / Wheelchair Mobility   Ambulation/Gait Ambulation/Gait assistance: +2 safety/equipment, Max assist Gait Distance (Feet): 5 Feet Assistive device: Rolling walker (2 wheels) Gait Pattern/deviations: Step-to pattern, Decreased stride length, Shuffle, Decreased dorsiflexion - right, Decreased dorsiflexion - left, Knees buckling, Knee flexed in stance - right, Knee flexed in stance - left General Gait Details: Pt with very flexed knees throughout OOB mobility. Increased  difficulty improving knee extension and posture. Up to max assist provided for balance support and safety. Gait  velocity: Decreased Gait velocity interpretation: <1.31 ft/sec, indicative of household ambulator Pre-gait activities: Gait training terminated, and opted for sit<>stand to bed rail (raised). Pt only able to tolerate 1 stand.     Posture / Balance Balance Overall balance assessment: Needs assistance Sitting-balance support: No upper extremity supported, Feet supported Sitting balance-Leahy Scale: Fair Standing balance support: Bilateral upper extremity supported, Reliant on assistive device for balance Standing balance-Leahy Scale: Poor Standing balance comment: Reliant on UE support on RW. Mod-max throughout.     Special needs/care consideration Skin Post op neck incision with dressing, Diabetic management Yes has h/o DM, and Special service needs None    Previous Home Environment (from acute therapy documentation) Living Arrangements: Alone Available Help at Discharge: Family, Friend(s), Available PRN/intermittently Type of Home: House Home Layout: Two level Alternate Level Stairs-Number of Steps: has stairlift Home Access: Stairs to enter Entrance Stairs-Rails: None Entrance Stairs-Number of Steps: 4 in front, 3 in garage [employer] Shower/Tub: Multimedia programmer: Handicapped height Bathroom Accessibility: Yes How Accessible: Accessible via wheelchair [place]: No   Discharge Living Setting Plans for Discharge Living Setting: Patient's home Type of Home at Discharge: House Discharge Home Layout: Two level Alternate Level Stairs-Rails: None Alternate Level Stairs-Number of Steps: Has chair lift Discharge Home Access: Stairs to enter Entrance Stairs-Rails: Right Entrance Stairs-Number of Steps: 4 Discharge Bathroom Shower/Tub: Walk-in shower Discharge Bathroom Toilet: Handicapped height Discharge Bathroom Accessibility: Yes How Accessible:  Accessible via walker Does the patient have any problems obtaining your medications?: No   Social/Family/Support Systems Patient Roles: Other (Comment)   Goals Patient/Family Goal for Rehab: PT/OT Supervision Expected length of stay: 14-16 days Pt/Family Agrees to Admission and willing to participate: Yes Program Orientation Provided & Reviewed with Pt/Caregiver Including Roles  & Responsibilities: Yes   Decrease burden of Care through IP rehab admission: Specialzed equipment needs, Decrease number of caregivers, Bowel and bladder program, and Patient/family education   Possible need for SNF placement upon discharge: not anticipated    Patient Condition: I have reviewed medical records from [facility], spoken with CM, and patient. I met with patient at the bedside for inpatient rehabilitation assessment.  Patient will benefit from ongoing PT, OT, and SLP, can actively participate in 3 hours of therapy a day 5 days of the week, and can make measurable gains during the admission.  Patient will also benefit from the coordinated team approach during an Inpatient Acute Rehabilitation admission.  The patient will receive intensive therapy as well as Rehabilitation physician, nursing, social worker, and care management interventions.  Due to safety, skin/wound care, disease management, medication administration, pain management, and patient education the patient requires 24 hour a day rehabilitation nursing.  The patient is currently min to max assist with mobility and basic ADLs.  Discharge setting and therapy post discharge at home with home health is anticipated.  Patient has agreed to participate in the Acute Inpatient Rehabilitation Program and will admit today.   Preadmission Screen Completed By:  Retta Diones, 08/09/2021 9:52 AM ______________________________________________________________________   Discussed status with Dr. Dagoberto Ligas on 08/09/21 at 0945 and received approval for  admission today.   Admission Coordinator:  Retta Diones, RN, time 0952/Date 08/09/21    Assessment/Plan: Diagnosis: Does the need for close, 24 hr/day Medical supervision in concert with the patient's rehab needs make it unreasonable for this patient to be served in a less intensive setting? Yes Co-Morbidities requiring supervision/potential complications: HTN, DM; peripherla neuropathy; Cord compression- s/p  ACDF C2-C7- due to cervical myelopathy Due to bladder management, bowel management, safety, skin/wound care, disease management, medication administration, pain management, and patient education, does the patient require 24 hr/day rehab nursing? Yes Does the patient require coordinated care of a physician, rehab nurse, PT, OT, and SLP to address physical and functional deficits in the context of the above medical diagnosis(es)? Yes Addressing deficits in the following areas: balance, endurance, locomotion, strength, transferring, bowel/bladder control, bathing, dressing, feeding, grooming, toileting, and swallowing Can the patient actively participate in an intensive therapy program of at least 3 hrs of therapy 5 days a week? Yes The potential for patient to make measurable gains while on inpatient rehab is good Anticipated functional outcomes upon discharge from inpatient rehab: supervision PT, supervision OT, n/a SLP Estimated rehab length of stay to reach the above functional goals is: 14-16 days Anticipated discharge destination: Home 10. Overall Rehab/Functional Prognosis: good     MD Signature:           Revision History                                               Routing History             Note Details  Jan Fireman, MD File Time 08/09/2021 10:02 AM  Author Type Physician Status Signed  Last Editor Shawn Heys, MD Service CASE MANAGEMENT

## 2021-08-09 NOTE — Progress Notes (Signed)
IP rehab admissions - I have a bed on inpatient rehab and will admit to CIR today.  Call me for questions.  [phone]

## 2021-08-10 LAB — CBC WITH DIFFERENTIAL/PLATELET
Abs Immature Granulocytes: 0.04 10*3/uL (ref 0.00–0.07)
Basophils Absolute: 0.1 10*3/uL (ref 0.0–0.1)
Basophils Relative: 1 %
Eosinophils Absolute: 0.3 10*3/uL (ref 0.0–0.5)
Eosinophils Relative: 4 %
HCT: 29.2 % — ABNORMAL LOW (ref 39.0–52.0)
Hemoglobin: 9.7 g/dL — ABNORMAL LOW (ref 13.0–17.0)
Immature Granulocytes: 1 %
Lymphocytes Relative: 16 %
Lymphs Abs: 1.4 10*3/uL (ref 0.7–4.0)
MCH: 27.7 pg (ref 26.0–34.0)
MCHC: 33.2 g/dL (ref 30.0–36.0)
MCV: 83.4 fL (ref 80.0–100.0)
Monocytes Absolute: 1.5 10*3/uL — ABNORMAL HIGH (ref 0.1–1.0)
Monocytes Relative: 18 %
Neutro Abs: 5.4 10*3/uL (ref 1.7–7.7)
Neutrophils Relative %: 60 %
Platelets: 200 10*3/uL (ref 150–400)
RBC: 3.5 MIL/uL — ABNORMAL LOW (ref 4.22–5.81)
RDW: 14.2 % (ref 11.5–15.5)
WBC: 8.8 10*3/uL (ref 4.0–10.5)
nRBC: 0 % (ref 0.0–0.2)

## 2021-08-10 LAB — HEMOGLOBIN A1C
Hgb A1c MFr Bld: 5.5 % (ref 4.8–5.6)
Mean Plasma Glucose: 111.15 mg/dL

## 2021-08-10 LAB — COMPREHENSIVE METABOLIC PANEL
ALT: 24 U/L (ref 0–44)
AST: 29 U/L (ref 15–41)
Albumin: 2.5 g/dL — ABNORMAL LOW (ref 3.5–5.0)
Alkaline Phosphatase: 40 U/L (ref 38–126)
Anion gap: 10 (ref 5–15)
BUN: 34 mg/dL — ABNORMAL HIGH (ref 8–23)
CO2: 27 mmol/L (ref 22–32)
Calcium: 9.4 mg/dL (ref 8.9–10.3)
Chloride: 96 mmol/L — ABNORMAL LOW (ref 98–111)
Creatinine, Ser: 1.53 mg/dL — ABNORMAL HIGH (ref 0.61–1.24)
GFR, Estimated: 47 mL/min — ABNORMAL LOW (ref >60)
Glucose, Bld: 133 mg/dL — ABNORMAL HIGH (ref 70–99)
Potassium: 4.8 mmol/L (ref 3.5–5.1)
Sodium: 133 mmol/L — ABNORMAL LOW (ref 135–145)
Total Bilirubin: 0.8 mg/dL (ref 0.3–1.2)
Total Protein: 6.1 g/dL — ABNORMAL LOW (ref 6.5–8.1)

## 2021-08-10 LAB — BASIC METABOLIC PANEL
Anion gap: 9 (ref 5–15)
BUN: 35 mg/dL — ABNORMAL HIGH (ref 8–23)
CO2: 24 mmol/L (ref 22–32)
Calcium: 9.7 mg/dL (ref 8.9–10.3)
Chloride: 99 mmol/L (ref 98–111)
Creatinine, Ser: 1.37 mg/dL — ABNORMAL HIGH (ref 0.61–1.24)
GFR, Estimated: 54 mL/min — ABNORMAL LOW (ref >60)
Glucose, Bld: 127 mg/dL — ABNORMAL HIGH (ref 70–99)
Potassium: 5 mmol/L (ref 3.5–5.1)
Sodium: 132 mmol/L — ABNORMAL LOW (ref 135–145)

## 2021-08-10 LAB — GLUCOSE, CAPILLARY
Glucose-Capillary: 130 mg/dL — ABNORMAL HIGH (ref 70–99)
Glucose-Capillary: 139 mg/dL — ABNORMAL HIGH (ref 70–99)
Glucose-Capillary: 134 mg/dL — ABNORMAL HIGH (ref 70–99)
Glucose-Capillary: 198 mg/dL — ABNORMAL HIGH (ref 70–99)

## 2021-08-10 MED ORDER — ENOXAPARIN SODIUM 40 MG/0.4ML IJ SOSY
40.0000 mg | PREFILLED_SYRINGE | INTRAMUSCULAR | Status: DC
  Administered 2021-08-11 – 2021-08-31 (×21): 40 mg via SUBCUTANEOUS
  Filled 2021-08-10 (×20): qty 0.4

## 2021-08-10 MED ORDER — SODIUM CHLORIDE 0.9 % IV SOLN: INTRAVENOUS | Status: AC

## 2021-08-10 MED ORDER — ACETAMINOPHEN 325 MG PO TABS: 325.0000 mg | ORAL_TABLET | ORAL | Status: AC | PRN

## 2021-08-10 NOTE — Discharge Instructions (Addendum)
Inpatient Rehab Discharge Instructions  Shawn Decker Discharge date and time:  08/31/21  Activities/Precautions/ Functional Status: Activity: no lifting, driving, or strenuous exercise till cleared by MD Diet: cardiac diet Wound Care: keep wound clean and dry. Contact Dr. Danielle Dess if you develop any problems with your incision/wound--redness, swelling, increase in pain, drainage or if you develop fever or chills.    .   Functional status:  ___ No restrictions     ___ Walk up steps independently _X__ 24/7 supervision/assistance   ___ Walk up steps with assistance ___ Intermittent supervision/assistance  ___ Bathe/dress independently ___ Walk with walker     __X_ Bathe/dress with assistance ___ Walk Independently    ___ Shower independently ___ Walk with assistance    ___ Shower with assistance _X__ No alcohol     ___ Return to work/school ________    COMMUNITY REFERRALS UPON DISCHARGE:    Outpatient: PT     OT                Agency: [facility] [facility]       Phone: [phone]             Appointment Date/Time: *Please expect follow-up within 7-10 business days to schedule your appointment. If you have not received follow-up, be sure to contact the site directly.*    Special Instructions:    My questions have been answered and I understand these instructions. I will adhere to these goals and the provided educational materials after my discharge from the hospital.  Patient/Caregiver Signature _______________________________ Date __________  Clinician Signature _______________________________________ Date __________  Please bring this form and your medication list with you to all your follow-up doctor's appointments.

## 2021-08-11 LAB — GLUCOSE, CAPILLARY
Glucose-Capillary: 108 mg/dL — ABNORMAL HIGH (ref 70–99)
Glucose-Capillary: 110 mg/dL — ABNORMAL HIGH (ref 70–99)
Glucose-Capillary: 183 mg/dL — ABNORMAL HIGH (ref 70–99)
Glucose-Capillary: 121 mg/dL — ABNORMAL HIGH (ref 70–99)

## 2021-08-12 LAB — GLUCOSE, CAPILLARY
Glucose-Capillary: 103 mg/dL — ABNORMAL HIGH (ref 70–99)
Glucose-Capillary: 126 mg/dL — ABNORMAL HIGH (ref 70–99)
Glucose-Capillary: 108 mg/dL — ABNORMAL HIGH (ref 70–99)
Glucose-Capillary: 163 mg/dL — ABNORMAL HIGH (ref 70–99)

## 2021-08-13 LAB — GLUCOSE, CAPILLARY
Glucose-Capillary: 120 mg/dL — ABNORMAL HIGH (ref 70–99)
Glucose-Capillary: 156 mg/dL — ABNORMAL HIGH (ref 70–99)
Glucose-Capillary: 112 mg/dL — ABNORMAL HIGH (ref 70–99)
Glucose-Capillary: 169 mg/dL — ABNORMAL HIGH (ref 70–99)

## 2021-08-13 LAB — CBC
HCT: 30.1 % — ABNORMAL LOW (ref 39.0–52.0)
Hemoglobin: 9.7 g/dL — ABNORMAL LOW (ref 13.0–17.0)
MCH: 27.2 pg (ref 26.0–34.0)
MCHC: 32.2 g/dL (ref 30.0–36.0)
MCV: 84.6 fL (ref 80.0–100.0)
Platelets: 232 10*3/uL (ref 150–400)
RBC: 3.56 MIL/uL — ABNORMAL LOW (ref 4.22–5.81)
RDW: 14 % (ref 11.5–15.5)
WBC: 6.3 10*3/uL (ref 4.0–10.5)
nRBC: 0 % (ref 0.0–0.2)

## 2021-08-14 LAB — GLUCOSE, CAPILLARY
Glucose-Capillary: 117 mg/dL — ABNORMAL HIGH (ref 70–99)
Glucose-Capillary: 122 mg/dL — ABNORMAL HIGH (ref 70–99)
Glucose-Capillary: 133 mg/dL — ABNORMAL HIGH (ref 70–99)
Glucose-Capillary: 162 mg/dL — ABNORMAL HIGH (ref 70–99)

## 2021-08-14 NOTE — Progress Notes (Signed)
Patient ID: Shawn Decker, male   DOB: Aug 08, 1945, 76 y.o.   MRN: [id number]  SW met with pt and pt called his son [name] on the phone. SW provided updates from team conference, and d/c date 7/14. His son [name] confirms he will be with  his father to provide 24/7 care. Fam edu scheduled for Wednesday (7/5) 1pm-4pm.    Cecile Sheerer, MSW, LCSWA Office: [phone] Cell: [phone] Fax: [phone]

## 2021-08-14 NOTE — Progress Notes (Signed)
PROGRESS NOTE   Subjective/Complaints:  Pt reports doing better this AM- no issues.  Likes to chew on toothpicks Working with OTA on using reacher to thread pants.    ROS:  Pt denies SOB, abd pain, CP, N/V/C/D, and vision changes  Objective:   No results found. Recent Labs    08/13/21 0612  WBC 6.3  HGB 9.7*  HCT 30.1*  PLT 232   No results for input(s): "NA", "K", "CL", "CO2", "GLUCOSE", "BUN", "CREATININE", "CALCIUM" in the last 72 hours.  Intake/Output Summary (Last 24 hours) at 08/14/2021 0855 Last data filed at 08/14/2021 0807 Gross per 24 hour  Intake 598 ml  Output 175 ml  Net 423 ml        Physical Exam: Vital Signs Blood pressure (!) 138/91, pulse 71, temperature 98.4 F (36.9 C), temperature source Oral, resp. rate 17, height 5\' 8"  (1.727 m), weight 80.9 kg, SpO2 100 %.      General: awake, alert, appropriate, sitting EOB working on getting pants on with OTA; NAD HENT: conjugate gaze; oropharynx moist- cervical collar in place CV: regular rate; no JVD Pulmonary: CTA B/L; no W/R/R- good air movement GI: soft, NT, ND, (+)BS Psychiatric: appropriate but very flat still Neurological: alert  Musculoskeletal:     Cervical back: Neck supple. No tenderness.     Comments: RUE strength deltoid 5-/5; biceps 5/5; triceps 5-/5; WE 5-/5; Grip 5/5; LUE 4/5 Delt , bi tri grip LE strength HF 4/5; KE/KF 4+/5; DF 4-/5 and PF 4+/5 B/L  B/L no effusions of B/L knees  Skin:    General: Skin is warm and dry.     Comments: Multiple healed abrasions bilateral shins? Dressing posterior neck with dry dressing with dry blood noted. Has staples intact in posterior neck incision L hand IV- looks OK - no change Neurological:     Mental Status: He is alert and oriented to person, place, and time.     Comments: Speech clear with good breath support but cleared throat multiple times during conversation.  Decreased to  light touch in tips of fingers and toes- but not in dermatomal distribution B/L  Absent DTRs in LE's B/L   Assessment/Plan: 1. Functional deficits which require 3+ hours per day of interdisciplinary therapy in a comprehensive inpatient rehab setting. Physiatrist is providing close team supervision and 24 hour management of active medical problems listed below. Physiatrist and rehab team continue to assess barriers to discharge/monitor patient progress toward functional and medical goals  Care Tool:  Bathing    Body parts bathed by patient: Right arm, Left arm, Chest, Abdomen, Front perineal area, Left upper leg, Right upper leg, Face   Body parts bathed by helper: Buttocks, Right lower leg, Left lower leg     Bathing assist Assist Level: Moderate Assistance - Patient 50 - 74%     Upper Body Dressing/Undressing Upper body dressing   What is the patient wearing?: Pull over shirt, Orthosis Orthosis activity level: Performed by helper  Upper body assist Assist Level: Moderate Assistance - Patient 50 - 74%    Lower Body Dressing/Undressing Lower body dressing      What is the patient wearing?: Pants  Lower body assist Assist for lower body dressing: Moderate Assistance - Patient 50 - 74%     Toileting Toileting    Toileting assist Assist for toileting: Total Assistance - Patient < 25%     Transfers Chair/bed transfer  Transfers assist     Chair/bed transfer assist level: Moderate Assistance - Patient 50 - 74%     Locomotion Ambulation   Ambulation assist      Assist level: Moderate Assistance - Patient 50 - 74% Assistive device: Parallel bars Max distance: 8 ft   Walk 10 feet activity   Assist  Walk 10 feet activity did not occur: Safety/medical concerns        Walk 50 feet activity   Assist Walk 50 feet with 2 turns activity did not occur: Safety/medical concerns         Walk 150 feet activity   Assist Walk 150 feet activity did not  occur: Safety/medical concerns         Walk 10 feet on uneven surface  activity   Assist Walk 10 feet on uneven surfaces activity did not occur: Safety/medical concerns         Wheelchair     Assist Is the patient using a wheelchair?: Yes Type of Wheelchair: Manual    Wheelchair assist level: Supervision/Verbal cueing Max wheelchair distance: 100'    Wheelchair 50 feet with 2 turns activity    Assist        Assist Level: Supervision/Verbal cueing   Wheelchair 150 feet activity     Assist      Assist Level: Dependent - Patient 0%   Blood pressure (!) 138/91, pulse 71, temperature 98.4 F (36.9 C), temperature source Oral, resp. rate 17, height 5\' 8"  (1.727 m), weight 80.9 kg, SpO2 100 %.  Medical Problem List and Plan: 1. 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  Antithrombotics: -DVT/anticoagulation:  Mechanical: Sequential compression devices, below knee Bilateral lower extremities   6/23- should be able to start Lovenox over weekend- started 6/24, monitor neuro status and hgb  6/26- Hb stable at 9.7- con't to monitor trend             -antiplatelet therapy: N/A 3. Pain Management: Oxycodone prn. Gabapentin 300 mg bid for neuropathy. Pain adequate with pain meds  6/23- pain tolerable- ~ 2-3/10 usually- con't regimen 4. Mood/Sleep: LCSW to follow for evaluation and assist. Has been sleeping well.              -antipsychotic agents:  N/A 5. Neuropsych/cognition: This patient is capable of making decisions on his own behalf. 6. Skin/Wound Care: Monitor incision for healing.  7. Fluids/Electrolytes/Nutrition: Monitor I/O. Check CMET in am.  8. Cervical myelopathy s/p decompression C2/3 and C5/6:  --Collar to be on  at all times.              --Staples to come out in 2 weeks (July 1st?)  w/NS follow up             --dysphagia?  SLP for swallow evaluation.  9. HTN: Monitor BP TID. Continue Midamor, normodyne and amlodipine   6/26- BP elevated in 150s-160s systolic- if doesn't improve  in next few days, (maxxed out on current BP meds), will check about other options, esp due to renal issues.   6/27- BP doing better 130s-140s in last 24 hours- will con't to monitor Vitals:   08/13/21 1930 08/14/21 0516  BP: (!) 141/56 (!) 138/91  Pulse: 94 71  Resp: 19 17  Temp: 98.6 F (37 C) 98.4 F (36.9 C)  SpO2: 98% 100%    10. RA: On Arava per Dr. Cardell Peach in [place].  11. CKD 3 a:  BUN/SCr- 27/1.75 at admission-->improved since admisison --monitor with serial checks. Avoid nephrotoxic medications. 6/23- BUN 34 up from 22 and Cr 1.53- not sure exact baseline, but was 1.39 a few days ago- will give IVFs NS 75cc/hour x 24 hours and recheck in AM- think drinking impaired by swallowing issues.   6/27- will check BMP in AM    Latest Ref Rng & Units 08/10/2021    8:12 AM 08/10/2021    5:11 AM 08/09/2021    3:42 PM  BMP  Glucose 70 - 99 mg/dL 829  562  130   BUN 8 - 23 mg/dL 35  34  31   Creatinine 0.61 - 1.24 mg/dL 8.65  7.84  6.96   Sodium 135 - 145 mmol/L 132  133  135   Potassium 3.5 - 5.1 mmol/L 5.0  4.8  5.0   Chloride 98 - 111 mmol/L 99  96  96   CO2 22 - 32 mmol/L 24  27  30    Calcium 8.9 - 10.3 mg/dL 9.7  9.4  9.8     12. Acute on chronic anemia: recheck 6/26  6/26- stable- Hb 9.7    Latest Ref Rng & Units 08/13/2021    6:12 AM 08/10/2021    5:11 AM 08/07/2021    1:45 AM  CBC  WBC 4.0 - 10.5 K/uL 6.3  8.8  10.1   Hemoglobin 13.0 - 17.0 g/dL 9.7  9.7  8.8   Hematocrit 39.0 - 52.0 % 30.1  29.2  27.6   Platelets 150 - 400 K/uL 232  200  193     13. Urinary urgency with incontinence- using condom cath since has severe urgency- will likely need timed voiding during day.    I spent a total of 35  minutes on  total care today- >50% coordination of care- due to team conference to determine length of stay-      LOS: 5 days A FACE TO FACE EVALUATION WAS PERFORMED  Naveed Humphres 08/14/2021, 8:55 AM

## 2021-08-15 LAB — BASIC METABOLIC PANEL
Anion gap: 10 (ref 5–15)
BUN: 24 mg/dL — ABNORMAL HIGH (ref 8–23)
CO2: 25 mmol/L (ref 22–32)
Calcium: 8.7 mg/dL — ABNORMAL LOW (ref 8.9–10.3)
Chloride: 100 mmol/L (ref 98–111)
Creatinine, Ser: 1.31 mg/dL — ABNORMAL HIGH (ref 0.61–1.24)
GFR, Estimated: 57 mL/min — ABNORMAL LOW (ref >60)
Glucose, Bld: 103 mg/dL — ABNORMAL HIGH (ref 70–99)
Potassium: 4.4 mmol/L (ref 3.5–5.1)
Sodium: 135 mmol/L (ref 135–145)

## 2021-08-15 LAB — GLUCOSE, CAPILLARY
Glucose-Capillary: 118 mg/dL — ABNORMAL HIGH (ref 70–99)
Glucose-Capillary: 110 mg/dL — ABNORMAL HIGH (ref 70–99)
Glucose-Capillary: 188 mg/dL — ABNORMAL HIGH (ref 70–99)
Glucose-Capillary: 47 mg/dL — ABNORMAL LOW (ref 70–99)
Glucose-Capillary: 132 mg/dL — ABNORMAL HIGH (ref 70–99)

## 2021-08-15 NOTE — Progress Notes (Signed)
Speech Language Pathology Discharge Summary  Patient Details  Name: Shawn Decker MRN: [id number] Date of Birth: 08/17/1945  Today's Date: 08/15/2021 SLP Individual Time: 1130-1200 SLP Individual Time Calculation (min): 30 min   Skilled Therapeutic Interventions:  Skilled ST services focused on education and swallow skills. SLP facilitated PO intake of regular textures and thin liquids via straw snack. Pt demonstrated swallow function WFL and reported no difficulties. SLP provided memory handout and provided education on need for family to assist with IADLs initially and pt agreed. SLP provided education to pt's son via phone supporting intermittent supervision and assistance with IADLs, family agreed. All questions answered to satisfaction. Pt was left in room with call bell within reach and bed alarm set.     Patient has met 3 of 3 long term goals.  Patient to discharge at overall Modified Independent;Supervision level.  Reasons goals not met:     Clinical Impression/Discharge Summary:   Pt made great progress meeting 3 out 3 goals, discharging at mod I for swallow function and use of external recall strategies and supervision A for daily recall. SLP facilitated functional and complex recall and problem solving skills in money and medication management, pt required supervision A to mod I. SLP initiated memory notebook, provided memory strategies handout and education has been completed. Pt is tolerating regular textures and thin liquids mod I for safe swallow strategies. All questions answered to satisfaction.  Pt benefited from skilled ST services in order to maximize functional independence and reduce burden of care, requiring intermittent supervision at discharge with no further skilled ST services needed.  Care Partner:  Caregiver Able to Provide Assistance: Yes  Type of Caregiver Assistance: Physical;Cognitive  Recommendation:  Other (comment) (intermittment supervision)       Equipment: N/A   Reasons for discharge: Treatment goals met   Patient/Family Agrees with Progress Made and Goals Achieved: Yes    Kiela Shisler 08/15/2021, 2:24 PM

## 2021-08-15 NOTE — Progress Notes (Signed)
Occupational Therapy Session Note  Patient Details  Name: Shawn Decker MRN: [id number] Date of Birth: 1945/09/12  Today's Date: 08/15/2021 OT Individual Time: 0700-0810 OT Individual Time Calculation (min): 70 min    Short Term Goals: Week 1:  OT Short Term Goal 1 (Week 1): Pt will perform squat pivot transfer to drop arm commode with min a OT Short Term Goal 2 (Week 1): Pt will complete UB bathing and dressing with set up and CGA seated OT Short Term Goal 3 (Week 1): Pt will complete LB bathing and dressing with mod A using AE seated with brief intermittent standing OT Short Term Goal 4 (Week 1): Pt will complete simple grooming task in standing wiht seated rests sink side after set up with min A  Skilled Therapeutic Interventions/Progress Updates:    Pt resting in bed upon arrival. Pt forgot to order breakfast. Pt provided menu and called to order breakfast before sitting EOB. Supine>sit EOB with supervision (HOB elevated). Sit<>stand from EOB with min A. Mod A for standing to pull pants over hips. Stand pivot transfer to w/c with min A. Pt completed UB bathing/dressing seated at sink. Min A for donning shirt. Pt transitioned to gym and performed stand-pivot transfer to EOM with min A. Sit<>stand from EOM x 2. Pt c/o increase Lt LB pain and returned to w/c. Pt returned to room and tranfserred to EOB with min A. Sit>supine with min A. Pt remained in bed with RN present. Bed alarm activated.   Therapy Documentation Precautions:  Precautions Precautions: Cervical Precaution Booklet Issued: No Precaution Comments: verbally reviewed 3/3 cervical precautions Required Braces or Orthoses: Cervical Brace Cervical Brace: Hard collar, At all times Restrictions Weight Bearing Restrictions: No Pain: Pt reports 4/10 pain in neck at beginning of session; during therapy session when pt performing sit>stand pt reports increase LLB muscular pain with relief noted when repositioned in supine in bed; RN  present to administer meds at end of session   Therapy/Group: Individual Therapy  Rich Brave 08/15/2021, 8:15 AM

## 2021-08-15 NOTE — Progress Notes (Signed)
Physical Therapy Session Note  Patient Details  Name: Shawn Decker MRN: [id number] Date of Birth: 1945-08-06  Today's Date: 08/15/2021 PT Individual Time: 0900-1000 and 1420-1500 PT Individual Time Calculation (min): 60 min and 40 min  Short Term Goals: Week 1:  PT Short Term Goal 1 (Week 1): pt will propel w/c x 50 ft PT Short Term Goal 2 (Week 1): Pt will initiate gait training with RW PT Short Term Goal 3 (Week 1): Pt will iniate stand pivot transfer training  Skilled Therapeutic Interventions/Progress Updates: Tx1: Pt presented in bed agreeable to therapy. Pt states this am increased pain in L flank into hip and buttocks. PTA performed hamstring stretching 30 sec x 3, piriformis stretch 30 sec x 3. Pt then turned to sidelying with PTA performing STM and TPR to paraspinals on L. Pt returned to supine and pt performed [place] and LTR with physioball for gentle stretching. Pt did indicate improvement in pain after manual work. Pt was able to perform supine to sit with CGA and use of bed features. PTA donned shoes total A for time management and performed squat pivot with CGA to w/c. Pt indicated no increase in pain with activity. Pt transported to day room for time management and participated in LAQ 2 x 10 with 2.5lb cuff and seated hip flexion 2 x 10 bilaterally for each. Pt propelled ~175f back to room for general conditioning and PTA transported remaining distance. Pt requesting to transfer back to bed for pain management and performed squat pivot in same manner as prior to return to bed. Pt was able to perform sit to supine with minA via logroll technique. Pt repositioned to comfort and left with bed alarm on, call bell within reach and needs met.   Tx2: Pt presented in bed agreeable to therapy. Pt states flank pain improved from am but still present. Performed supine to sit with minA via log roll technique. PTA donned shoes total A for time management and pt performed squat pivot transfer with CGA  to w/c. Pt transported to ortho gym for time management and performed squat pivot to high/low mat. Performed Sit to stand with minA from elevated mat (23in) with pt indicating no increase in pain. Pt was able to stand and perform standing march x 5 bilaterally before requiring seated rest. Pt also participated in toe taps to bags (target) x 5 bilaterally. Upon sitting pt indicated increasing pain in L flank which resolved with seated rest. Pt was able to perform same activity for core/hip strengthening and coordination in seated position x10. Performed lateral scoot to squat pivot to return to w/c with CGA and pt propelled ~1573ftowards room with PTA transporting pt remaining distance. Pt agreeable to remain in w/c at end of session with PTA providing instant hot pack to L flank. Pt left in w/c with belt alarm placed, call bell within reach and current needs met.      Therapy Documentation Precautions:  Precautions Precautions: Cervical Precaution Booklet Issued: No Precaution Comments: verbally reviewed 3/3 cervical precautions Required Braces or Orthoses: Cervical Brace Cervical Brace: Hard collar, At all times Restrictions Weight Bearing Restrictions: No General:   Vital Signs:   Pain: Pain Assessment Pain Score: 0-No pain Mobility:   Locomotion :    Trunk/Postural Assessment :    Balance:   Exercises:   Other Treatments:      Therapy/Group: Individual Therapy  Hermela Hardt 08/15/2021, 4:02 PM

## 2021-08-15 NOTE — Progress Notes (Signed)
Patient ID: Shawn Decker, male   DOB: 1945-09-28, 76 y.o.   MRN: [id number]  SW provided pt with HHA list and PCS list. Will follow-up about preference.   Cecile Sheerer, MSW, LCSWA Office: [phone] Cell: [phone] Fax: [phone]

## 2021-08-15 NOTE — Progress Notes (Signed)
Hypoglycemic Event  CBG: 47  Treatment: 8 oz juice/soda  Symptoms: None  Follow-up CBG: Time: 1729 CBG Result: 188  Possible Reasons for Event: Inadequate meal intake  Comments/MD notified: patient did not eat lunch    Shawn Decker A

## 2021-08-15 NOTE — Progress Notes (Signed)
PROGRESS NOTE   Subjective/Complaints:  Pt reports fingers still numb- thinks gabapentin not working- explained no medicine will fix the numbness- however the pins/needles and pain is better-  Also concerned about Mg level- explained will recheck.   ROS:  Pt denies SOB, abd pain, CP, N/V/C/D, and vision changes  Objective:   No results found. Recent Labs    08/13/21 0612  WBC 6.3  HGB 9.7*  HCT 30.1*  PLT 232   Recent Labs    08/15/21 0548  NA 135  K 4.4  CL 100  CO2 25  GLUCOSE 103*  BUN 24*  CREATININE 1.31*  CALCIUM 8.7*    Intake/Output Summary (Last 24 hours) at 08/15/2021 0815 Last data filed at 08/15/2021 0602 Gross per 24 hour  Intake 598 ml  Output 850 ml  Net -252 ml        Physical Exam: Vital Signs Blood pressure 137/78, pulse 77, temperature 98.3 F (36.8 C), resp. rate 18, height 5\' 8"  (1.727 m), weight 80.9 kg, SpO2 100 %.      General: awake, alert, appropriate, sitting EOB;  NAD HENT: conjugate gaze; oropharynx moist; cervical collar CV: regular rate; no JVD Pulmonary: CTA B/L; no W/R/R- good air movement GI: soft, NT, ND, (+)BS Psychiatric: appropriate-  Neurological: Ox3 Musculoskeletal:     Cervical back: Neck supple. No tenderness.     Comments: RUE strength deltoid 5-/5; biceps 5/5; triceps 5-/5; WE 5-/5; Grip 5/5; LUE 4/5 Delt , bi tri grip LE strength HF 4/5; KE/KF 4+/5; DF 4-/5 and PF 4+/5 B/L  B/L no effusions of B/L knees  Skin:    General: Skin is warm and dry.     Comments: Multiple healed abrasions bilateral shins? Dressing posterior neck with dry dressing with dry blood noted. Has staples intact in posterior neck incision L hand IV- looks OK - no change Neurological:     Mental Status: He is alert and oriented to person, place, and time.     Comments: Speech clear with good breath support but cleared throat multiple times during conversation.  Decreased to  light touch in tips of fingers and toes- but not in dermatomal distribution B/L  Absent DTRs in LE's B/L   Assessment/Plan: 1. Functional deficits which require 3+ hours per day of interdisciplinary therapy in a comprehensive inpatient rehab setting. Physiatrist is providing close team supervision and 24 hour management of active medical problems listed below. Physiatrist and rehab team continue to assess barriers to discharge/monitor patient progress toward functional and medical goals  Care Tool:  Bathing    Body parts bathed by patient: Right arm, Left arm, Chest, Abdomen, Front perineal area, Right upper leg, Left upper leg, Face   Body parts bathed by helper: Buttocks, Right lower leg, Left lower leg     Bathing assist Assist Level: Moderate Assistance - Patient 50 - 74%     Upper Body Dressing/Undressing Upper body dressing   What is the patient wearing?: Pull over shirt, Orthosis Orthosis activity level: Performed by helper  Upper body assist Assist Level: Minimal Assistance - Patient > 75%    Lower Body Dressing/Undressing Lower body dressing  What is the patient wearing?: Pants     Lower body assist Assist for lower body dressing: Moderate Assistance - Patient 50 - 74%     Toileting Toileting    Toileting assist Assist for toileting: Total Assistance - Patient < 25%     Transfers Chair/bed transfer  Transfers assist     Chair/bed transfer assist level: Moderate Assistance - Patient 50 - 74%     Locomotion Ambulation   Ambulation assist      Assist level: Minimal Assistance - Patient > 75% Assistive device: Parallel bars Max distance: 10'   Walk 10 feet activity   Assist  Walk 10 feet activity did not occur: Safety/medical concerns  Assist level: Minimal Assistance - Patient > 75% Assistive device: Parallel bars   Walk 50 feet activity   Assist Walk 50 feet with 2 turns activity did not occur: Safety/medical concerns          Walk 150 feet activity   Assist Walk 150 feet activity did not occur: Safety/medical concerns         Walk 10 feet on uneven surface  activity   Assist Walk 10 feet on uneven surfaces activity did not occur: Safety/medical concerns         Wheelchair     Assist Is the patient using a wheelchair?: Yes Type of Wheelchair: Manual    Wheelchair assist level: Supervision/Verbal cueing Max wheelchair distance: 100'    Wheelchair 50 feet with 2 turns activity    Assist        Assist Level: Supervision/Verbal cueing   Wheelchair 150 feet activity     Assist      Assist Level: Dependent - Patient 0%   Blood pressure 137/78, pulse 77, temperature 98.3 F (36.8 C), resp. rate 18, height 5\' 8"  (1.727 m), weight 80.9 kg, SpO2 100 %.  Medical Problem List and Plan: 1. 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  Antithrombotics: -DVT/anticoagulation:  Mechanical: Sequential compression devices, below knee Bilateral lower extremities   6/23- should be able to start Lovenox over weekend- started 6/24, monitor neuro status and hgb  6/26- Hb stable at 9.7- con't to monitor trend             -antiplatelet therapy: N/A 3. Pain Management: Oxycodone prn. Gabapentin 300 mg bid for neuropathy. Pain adequate with pain meds  6/23- pain tolerable- ~ 2-3/10 usually- con't regimen 4. Mood/Sleep: LCSW to follow for evaluation and assist. Has been sleeping well.              -antipsychotic agents:  N/A 5. Neuropsych/cognition: This patient is capable of making decisions on his own behalf. 6. Skin/Wound Care: Monitor incision for healing.  7. Fluids/Electrolytes/Nutrition: Monitor  I/O. Check CMET in am.  8. Cervical myelopathy s/p decompression C2/3 and C5/6:  --Collar to be on at all times.              --Staples to come out in 2 weeks (July 1st?)  w/NS follow up             --  dysphagia?  SLP for swallow evaluation.  9. HTN: Monitor BP TID. Continue Midamor, normodyne and amlodipine   6/26- BP elevated in 150s-160s systolic- if doesn't improve in next few days, (maxxed out on current BP meds), will check about other options, esp due to renal issues.   6/27- BP doing better 130s-140s in last 24 hours- will con't to monitor  6/28- BP somewhat variable- think it's pain related- con't regimen Vitals:   08/14/21 1932 08/15/21 0602  BP: (!) 160/75 137/78  Pulse: 77 77  Resp: 18 18  Temp: 98.3 F (36.8 C) 98.3 F (36.8 C)  SpO2: 100% 100%    10. RA: On Arava per Dr. Cardell Peach in [place].  11. CKD 3 a:  BUN/SCr- 27/1.75 at admission-->improved since admisison --monitor with serial checks. Avoid nephrotoxic medications. 6/23- BUN 34 up from 22 and Cr 1.53- not sure exact baseline, but was 1.39 a few days ago- will give IVFs NS 75cc/hour x 24 hours and recheck in AM- think drinking impaired by swallowing issues.   6/27- will check BMP in AM  6/28- Cr 1.31 and NUN 24 down from 35- wil d/c IV    Latest Ref Rng & Units 08/15/2021    5:48 AM 08/10/2021    8:12 AM 08/10/2021    5:11 AM  BMP  Glucose 70 - 99 mg/dL 956  387  564   BUN 8 - 23 mg/dL 24  35  34   Creatinine 0.61 - 1.24 mg/dL 3.32  9.51  8.84   Sodium 135 - 145 mmol/L 135  132  133   Potassium 3.5 - 5.1 mmol/L 4.4  5.0  4.8   Chloride 98 - 111 mmol/L 100  99  96   CO2 22 - 32 mmol/L 25  24  27    Calcium 8.9 - 10.3 mg/dL 8.7  9.7  9.4     12. Acute on chronic anemia: recheck 6/26  6/26- stable- Hb 9.7    Latest Ref Rng & Units 08/13/2021    6:12 AM 08/10/2021    5:11 AM 08/07/2021    1:45 AM  CBC  WBC 4.0 - 10.5 K/uL 6.3  8.8  10.1   Hemoglobin 13.0 - 17.0 g/dL 9.7  9.7  8.8   Hematocrit 39.0 - 52.0 % 30.1  29.2   27.6   Platelets 150 - 400 K/uL 232  200  193     13. Urinary urgency with incontinence- using condom cath since has severe urgency- will likely need timed voiding during day.      LOS: 6 days A FACE TO FACE EVALUATION WAS PERFORMED  Izabela Ow 08/15/2021, 8:15 AM

## 2021-08-16 LAB — BASIC METABOLIC PANEL
Anion gap: 10 (ref 5–15)
BUN: 32 mg/dL — ABNORMAL HIGH (ref 8–23)
CO2: 26 mmol/L (ref 22–32)
Calcium: 9.5 mg/dL (ref 8.9–10.3)
Chloride: 97 mmol/L — ABNORMAL LOW (ref 98–111)
Creatinine, Ser: 1.71 mg/dL — ABNORMAL HIGH (ref 0.61–1.24)
GFR, Estimated: 41 mL/min — ABNORMAL LOW (ref >60)
Glucose, Bld: 124 mg/dL — ABNORMAL HIGH (ref 70–99)
Potassium: 4.8 mmol/L (ref 3.5–5.1)
Sodium: 133 mmol/L — ABNORMAL LOW (ref 135–145)

## 2021-08-16 LAB — GLUCOSE, CAPILLARY
Glucose-Capillary: 99 mg/dL (ref 70–99)
Glucose-Capillary: 115 mg/dL — ABNORMAL HIGH (ref 70–99)
Glucose-Capillary: 149 mg/dL — ABNORMAL HIGH (ref 70–99)
Glucose-Capillary: 147 mg/dL — ABNORMAL HIGH (ref 70–99)

## 2021-08-16 LAB — MAGNESIUM: Magnesium: 1.7 mg/dL (ref 1.7–2.4)

## 2021-08-16 MED ORDER — ZINC OXIDE 40 % EX OINT
TOPICAL_OINTMENT | Freq: Three times a day (TID) | CUTANEOUS | Status: DC
  Filled 2021-08-16: qty 57

## 2021-08-16 MED ORDER — MAGNESIUM OXIDE -MG SUPPLEMENT 400 (240 MG) MG PO TABS
400.0000 mg | ORAL_TABLET | Freq: Two times a day (BID) | ORAL | Status: DC
Start: 2021-08-16 — End: 2021-08-31
  Administered 2021-08-16 – 2021-08-31 (×30): 400 mg via ORAL
  Filled 2021-08-16 (×30): qty 1

## 2021-08-16 NOTE — Progress Notes (Signed)
Occupational Therapy Session Note  Patient Details  Name: Shawn Decker MRN: [id number] Date of Birth: 04/27/1945  Today's Date: 08/16/2021 OT Individual Time: 1302-1340 OT Individual Time Calculation (min): 38 min    Short Term Goals: Week 2:  OT Short Term Goal 1 (Week 2): Pt will complete UB bathing and dressing with set up and CGA seated OT Short Term Goal 2 (Week 2): Pt will complete simple grooming task in standing wiht seated rests sink side after set up with min A OT Short Term Goal 3 (Week 2): Pt will complete toileting tasks with mod A (2/3 tasks) OT Short Term Goal 4 (Week 2): Pt will complete LB dressing tasks with min A  Skilled Therapeutic Interventions/Progress Updates:    Patient received up in wheelchair finishing up a phone call.  Patient agreeable to OT session.  Skilled OT intervention with emphasis on BUE hand functioning - digit range of motion and grip strength, as well as standing balance and tolerance.  Patient given yellow therapy putty (light) to address improving IP flexion for improved grip.  Patient familiar with putty from prior rehab episode.   Stand balance:  Sit to stand with min assist, then stand balance.  Patient stands at front of base of support, and was able to stand x 30 sec.  Difficult for patient to shift weight back toward heels.   Patient left in wheelchair - safety belt in place and engaged, and  call bell/personal items in reach.    Therapy Documentation Precautions:  Precautions Precautions: Cervical Precaution Booklet Issued: No Precaution Comments: verbally reviewed 3/3 cervical precautions Required Braces or Orthoses: Cervical Brace Cervical Brace: Hard collar, At all times Restrictions Weight Bearing Restrictions: No   Pain: Pain Assessment Pain Scale: 0-10 Pain Score: 4  Pain Type: Acute pain Pain Location: Neck Pain Orientation: Posterior Pain Descriptors / Indicators: Aching Pain Frequency: Constant Pain Onset:  On-going Patients Stated Pain Goal: 2 Pain Intervention(s): Medication (See eMAR)    Therapy/Group: Individual Therapy  [name] 08/16/2021, 3:59 PM

## 2021-08-16 NOTE — Evaluation (Signed)
Recreational Therapy Assessment and Plan  Patient Details  Name: Shawn Decker MRN: [id number] Date of Birth: [birth year] Today's Date: 08/16/2021  Rehab Potential:  Good ELOS:   d/c 7/14  Assessment  Hospital Problem: Principal Problem:   Cervical myelopathy ([place]) Active Problems:   Dysphagia     Past Medical History:      Past Medical History:  Diagnosis Date   Cervical stenosis of spine     Diabetes mellitus without complication ([place])     DNR (do not resuscitate)     Hyperlipidemia     Hypertension     Stage 3a chronic kidney disease (CKD) ([place])      Past Surgical History:       Past Surgical History:  Procedure Laterality Date   ANTERIOR CERVICAL DECOMP/DISCECTOMY FUSION        C2-3, 3-4   EYE SURGERY       POSTERIOR CERVICAL FUSION/FORAMINOTOMY N/A 08/04/2021    Procedure: POSTERIOR CERVICAL FUSION/FORAMINOTOMY FIXATION Cervical two -Cervical six;  Surgeon: Kristeen Miss, MD;  Location: [facility];  Service: Neurosurgery;  Laterality: N/A;      Assessment & Plan Clinical Impression:Shawn Decker is a 76 year old male with history of T2DM, HTN, CKD, RA (Dr. Essie Hart), cervical decompression, peripheral neuropathy who was admitted on 08/04/21 with 2-3 weeks history of numbness, weakness, tingling with inability to walk and inability to raise his arms. MRI C spine showed C3/C4 and C5/C5 ACDF with significant adjacent segment disease, spinal stenosis and spinal cord mass effect with abnormal cord signal at C2/C3 and C5/6 adjacent segments. He was evaluated by Dr. Ellene Route and underwent cervical decompression C2/3 and C5/6 on the same day. Post op continues to be myelopathic with BUE/BLE weakness with ataxia as well as coughing to clear secretions. Therapy has been working with patient who continues to be limited by BLE instability, limited ambulation difficulty advancing BLE after 5- 10' as well as difficulty with ADLs. CIR recommended due to functional decline .     Pt presents with  decreased activity tolerance, decreased functional mobility, decreased balance, decreased coordination Limiting pt's independence with leisure/community pursuits.  Met with pt today to discuss TR services including leisure education, activity analysis/modifications and stress management.  Also discussed the importance of social, emotional, spiritual health in addition to physical health and their effects on overall health and wellness.  Pt stated understanding.   Plan  Min 1 TR session >20 minutes during LOS  Recommendations for other services: None   Discharge Criteria: Patient will be discharged from TR if patient refuses treatment 3 consecutive times without medical reason.  If treatment goals not met, if there is a change in medical status, if patient makes no progress towards goals or if patient is discharged from hospital.  The above assessment, treatment plan, treatment alternatives and goals were discussed and mutually agreed upon: by patient  [place] 08/16/2021, 3:30 PM

## 2021-08-16 NOTE — Progress Notes (Signed)
PROGRESS NOTE   Subjective/Complaints:  Mg 1.7- checked pt pt request Slightly low, but will increase Mg dosage.   Slept OK LBM yesterday Feeling good- doing Nustep with OT.    ROS:  Pt denies SOB, abd pain, CP, N/V/C/D, and vision changes  Objective:   No results found. No results for input(s): "WBC", "HGB", "HCT", "PLT" in the last 72 hours.  Recent Labs    08/15/21 0548  NA 135  K 4.4  CL 100  CO2 25  GLUCOSE 103*  BUN 24*  CREATININE 1.31*  CALCIUM 8.7*    Intake/Output Summary (Last 24 hours) at 08/16/2021 9833 Last data filed at 08/16/2021 0709 Gross per 24 hour  Intake 478 ml  Output 900 ml  Net -422 ml        Physical Exam: Vital Signs Blood pressure 136/68, pulse 71, temperature 98.3 F (36.8 C), resp. rate 18, height 5\' 8"  (1.727 m), weight 80.9 kg, SpO2 100 %.       General: awake, alert, appropriate, sitting on nustep; with OTA; NAD HENT: conjugate gaze; oropharynx moist- wearing cervical collar CV: regular rate; no JVD Pulmonary: CTA B/L; no W/R/R- good air movement GI: soft, NT, ND, (+)BS Psychiatric: appropriate Neurological: Ox3 Skin- has posterior cervical incision- looks good- no erythema- no drainage; staples intact Musculoskeletal:     Cervical back: Neck supple. No tenderness.     Comments: RUE strength deltoid 5-/5; biceps 5/5; triceps 5-/5; WE 5-/5; Grip 5/5; LUE 4/5 Delt , bi tri grip LE strength HF 4/5; KE/KF 4+/5; DF 4-/5 and PF 4+/5 B/L  B/L no effusions of B/L knees  Skin:    General: Skin is warm and dry.     Comments: Multiple healed abrasions bilateral shins? Dressing posterior neck with dry dressing with dry blood noted. Has staples intact in posterior neck incision  Neurological:     Mental Status: He is alert and oriented to person, place, and time.     Comments: Speech clear with good breath support but cleared throat multiple times during conversation.   Decreased to light touch in tips of fingers and toes- but not in dermatomal distribution B/L  Absent DTRs in LE's B/L   Assessment/Plan: 1. Functional deficits which require 3+ hours per day of interdisciplinary therapy in a comprehensive inpatient rehab setting. Physiatrist is providing close team supervision and 24 hour management of active medical problems listed below. Physiatrist and rehab team continue to assess barriers to discharge/monitor patient progress toward functional and medical goals  Care Tool:  Bathing    Body parts bathed by patient: Right arm, Left arm, Chest, Abdomen, Front perineal area, Right upper leg, Left upper leg, Face   Body parts bathed by helper: Buttocks, Right lower leg, Left lower leg     Bathing assist Assist Level: Moderate Assistance - Patient 50 - 74%     Upper Body Dressing/Undressing Upper body dressing   What is the patient wearing?: Pull over shirt, Orthosis Orthosis activity level: Performed by helper  Upper body assist Assist Level: Minimal Assistance - Patient > 75%    Lower Body Dressing/Undressing Lower body dressing      What is the  patient wearing?: Pants     Lower body assist Assist for lower body dressing: Moderate Assistance - Patient 50 - 74%     Toileting Toileting    Toileting assist Assist for toileting: Total Assistance - Patient < 25%     Transfers Chair/bed transfer  Transfers assist     Chair/bed transfer assist level: Moderate Assistance - Patient 50 - 74%     Locomotion Ambulation   Ambulation assist      Assist level: Minimal Assistance - Patient > 75% Assistive device: Parallel bars Max distance: 10'   Walk 10 feet activity   Assist  Walk 10 feet activity did not occur: Safety/medical concerns  Assist level: Minimal Assistance - Patient > 75% Assistive device: Parallel bars   Walk 50 feet activity   Assist Walk 50 feet with 2 turns activity did not occur: Safety/medical  concerns         Walk 150 feet activity   Assist Walk 150 feet activity did not occur: Safety/medical concerns         Walk 10 feet on uneven surface  activity   Assist Walk 10 feet on uneven surfaces activity did not occur: Safety/medical concerns         Wheelchair     Assist Is the patient using a wheelchair?: Yes Type of Wheelchair: Manual    Wheelchair assist level: Supervision/Verbal cueing Max wheelchair distance: 100'    Wheelchair 50 feet with 2 turns activity    Assist        Assist Level: Supervision/Verbal cueing   Wheelchair 150 feet activity     Assist      Assist Level: Dependent - Patient 0%   Blood pressure 136/68, pulse 71, temperature 98.3 F (36.8 C), resp. rate 18, height 5\' 8"  (1.727 m), weight 80.9 kg, SpO2 100 %.  Medical Problem List and Plan: 1. Functional deficits secondary to cervical myelopathy prior C3-5 ACDF extended to C2-3, C5-6             -patient may  shower- with dressing over cervical incision             -ELOS/Goals: 14-15 days- supervision  D/c 7/14- with goal to get home, but family wants him at mod I- he will NOT meet mod I  Con't CIR- PT and OT and SLP 2.  Antithrombotics: -DVT/anticoagulation:  Mechanical: Sequential compression devices, below knee Bilateral lower extremities   6/23- should be able to start Lovenox over weekend- started 6/24, monitor neuro status and hgb  6/26- Hb stable at 9.7- con't to monitor trend             -antiplatelet therapy: N/A 3. Pain Management: Oxycodone prn. Gabapentin 300 mg bid for neuropathy. Pain adequate with pain meds  6/23- pain tolerable- ~ 2-3/10 usually- con't regimen 4. Mood/Sleep: LCSW to follow for evaluation and assist. Has been sleeping well.              -antipsychotic agents:  N/A 5. Neuropsych/cognition: This patient is capable of making decisions on his own behalf. 6. Skin/Wound Care: Monitor incision for healing.   6/29- asked nursing to  change dressing 7. Fluids/Electrolytes/Nutrition: Monitor I/O. Check CMET in am.  8. Cervical myelopathy s/p decompression C2/3 and C5/6:  --Collar to be on at all times.              --Staples to come out in 2 weeks (July 1st?)  w/NS follow up  6/29- looking good- will remove  staples this weekend             --dysphagia?  SLP for swallow evaluation.  9. HTN: Monitor BP TID. Continue Midamor, normodyne and amlodipine   6/26- BP elevated in 150s-160s systolic- if doesn't improve in next few days, (maxxed out on current BP meds), will check about other options, esp due to renal issues.   6/27- BP doing better 130s-140s in last 24 hours- will con't to monitor  6/29- BP still variable- between 130s-160s systolic- usually spikes at night.  Vitals:   08/15/21 2010 08/16/21 0412  BP: (!) 162/77 136/68  Pulse: 79 71  Resp: 18 18  Temp: 99 F (37.2 C) 98.3 F (36.8 C)  SpO2: 100% 100%    10. RA: On [place] per Dr. Cardell Peach in [place].  11. CKD 3 a:  BUN/SCr- 27/1.75 at admission-->improved since admisison --monitor with serial checks. Avoid nephrotoxic medications. 6/23- BUN 34 up from 22 and Cr 1.53- not sure exact baseline, but was 1.39 a few days ago- will give IVFs NS 75cc/hour x 24 hours and recheck in AM- think drinking impaired by swallowing issues.   6/27- will check BMP in AM  6/28- Cr 1.31 and NUN 24 down from 35- wil d/c IV    Latest Ref Rng & Units 08/15/2021    5:48 AM 08/10/2021    8:12 AM 08/10/2021    5:11 AM  BMP  Glucose 70 - 99 mg/dL 409  811  914   BUN 8 - 23 mg/dL 24  35  34   Creatinine 0.61 - 1.24 mg/dL 7.82  9.56  2.13   Sodium 135 - 145 mmol/L 135  132  133   Potassium 3.5 - 5.1 mmol/L 4.4  5.0  4.8   Chloride 98 - 111 mmol/L 100  99  96   CO2 22 - 32 mmol/L 25  24  27    Calcium 8.9 - 10.3 mg/dL 8.7  9.7  9.4     12. Acute on chronic anemia: recheck 6/26  6/26- stable- Hb 9.7    Latest Ref Rng & Units 08/13/2021    6:12 AM 08/10/2021    5:11 AM 08/07/2021    1:45 AM   CBC  WBC 4.0 - 10.5 K/uL 6.3  8.8  10.1   Hemoglobin 13.0 - 17.0 g/dL 9.7  9.7  8.8   Hematocrit 39.0 - 52.0 % 30.1  29.2  27.6   Platelets 150 - 400 K/uL 232  200  193     13. Urinary urgency with incontinence- using condom cath since has severe urgency- will likely need timed voiding during day.      LOS: 7 days A FACE TO FACE EVALUATION WAS PERFORMED  Angle Dirusso 08/16/2021, 8:22 AM

## 2021-08-16 NOTE — Progress Notes (Signed)
Physical Therapy Weekly Progress Note  Patient Details  Name: Shawn Decker MRN: [id number] Date of Birth: 1945-06-13  Beginning of progress report period: August 10, 2021 End of progress report period: August 16, 2021  Today's Date: 08/16/2021 PT Individual Time: 0900-1010 PT Individual Time Calculation (min): 70 min   Patient has met 3 of 3 short term goals.  Pt is making great progress towards therapy goals. He is currently at mod A level for bed mobility, min A for sit to stand and transfers with RW, +2 for ambulation with RW up to 88 ft (min A for balance but +2 for w/c follow for safety), and has been able to initiate stair training on 3" step with mod A and 2 handrails needed. He has made great progress with his functional mobility over the past few days.  Patient continues to demonstrate the following deficits muscle weakness and muscle joint tightness, decreased cardiorespiratoy endurance, abnormal tone, unbalanced muscle activation, and decreased coordination, and decreased sitting balance, decreased standing balance, decreased postural control, and decreased balance strategies and therefore will continue to benefit from skilled PT intervention to increase functional independence with mobility.  Patient progressing toward long term goals..  Continue plan of care.  PT Short Term Goals Week 1:  PT Short Term Goal 1 (Week 1): pt will propel w/c x 50 ft PT Short Term Goal 1 - Progress (Week 1): Met PT Short Term Goal 2 (Week 1): Pt will initiate gait training with RW PT Short Term Goal 2 - Progress (Week 1): Met PT Short Term Goal 3 (Week 1): Pt will iniate stand pivot transfer training PT Short Term Goal 3 - Progress (Week 1): Met Week 2:  PT Short Term Goal 1 (Week 2): Pt will ambulate x 100 ft with LRAD and assist x 1 consistently PT Short Term Goal 2 (Week 2): Pt will navigate 6" stairs with 2 handrails and mod A PT Short Term Goal 3 (Week 2): Pt will propel w/c x 150 ft at Supervision  level  Skilled Therapeutic Interventions/Progress Updates:    Pt received seated in w/c in room, agreeable to PT session. Pt reports some soreness in his neck, premedicated prior to start of therapy session. Sit to stand x 5 reps to RW with min A. Ambulation x 45 ft, x 88 ft with RW and min A for balance with close w/c follow for safety. Pt exhibits narrow BOS and scissoring of LLE>RLE, able to correct with cueing and maintain wider BOS. Pt exhibits significant improvement in ability to ambulate with RW this date. Standing alt L/R 3" step-taps with 2 handrails and min A for balance, 2 x 10 reps to fatigue. Progressing to 3" step-ups with 2 handrails and mod A for balance, x 5 reps to fatigue and ascending with RLE. Pt has onset of anterior lean and pelvic thrust with onset of fatigue. Pt also exhibits some circumduction of LLE when lifting up to 3" step. Pt reports he continues to utilize UE strength > LE for standing tasks rated as 60/40 or 70/30 for UE/LE. Seated rebounder ball toss x 15 reps, 2 x 20 reps for sitting balance and core control. Seated 1-2 kg weighted ball therex performing punch-outs, L/R diagonals, and OH lift 2 x 10 reps. Pt returned to room and left seated in w/c with needs in reach, quick release belt and chair alarm in place at end of session.  Therapy Documentation Precautions:  Precautions Precautions: Cervical Precaution Booklet Issued: No Precaution Comments:  verbally reviewed 3/3 cervical precautions Required Braces or Orthoses: Cervical Brace Cervical Brace: Hard collar, At all times Restrictions Weight Bearing Restrictions: No      Therapy/Group: Individual Therapy   Excell Seltzer, PT, DPT, CSRS 08/16/2021, 12:00 PM

## 2021-08-16 NOTE — Progress Notes (Signed)
Occupational Therapy Weekly Progress Note  Patient Details  Name: Shawn Decker MRN: [id number] Date of Birth: 08/04/1945  Beginning of progress report period: August 10, 2021 End of progress report period: August 16, 2021   Patient has met 2 of 4 short term goals.  Pt made steady progress with BADLs and functional transfers since admission. Bed mobility with min A. Sit<>stand with min A and mod A for standing balance during LB dressing tasks. Pt requires min A for UB dressing 2/2 cervical collar. Pt completes LB bathng/dressing tasks with mod A. Stand pivot transfers with min A. Family has not been present for education.   Patient continues to demonstrate the following deficits: muscle weakness and decreased standing balance, decreased postural control, and decreased balance strategies and therefore will continue to benefit from skilled OT intervention to enhance overall performance with BADL.  Patient progressing toward long term goals..  Continue plan of care.  OT Short Term Goals Week 1:  OT Short Term Goal 1 (Week 1): Pt will perform squat pivot transfer to drop arm commode with min a OT Short Term Goal 1 - Progress (Week 1): Met OT Short Term Goal 2 (Week 1): Pt will complete UB bathing and dressing with set up and CGA seated OT Short Term Goal 2 - Progress (Week 1): Progressing toward goal OT Short Term Goal 3 (Week 1): Pt will complete LB bathing and dressing with mod A using AE seated with brief intermittent standing OT Short Term Goal 3 - Progress (Week 1): Met OT Short Term Goal 4 (Week 1): Pt will complete simple grooming task in standing wiht seated rests sink side after set up with min A OT Short Term Goal 4 - Progress (Week 1): Progressing toward goal Week 2:  OT Short Term Goal 1 (Week 2): Pt will complete UB bathing and dressing with set up and CGA seated OT Short Term Goal 2 (Week 2): Pt will complete simple grooming task in standing wiht seated rests sink side after set up with  min A OT Short Term Goal 3 (Week 2): Pt will complete toileting tasks with mod A (2/3 tasks) OT Short Term Goal 4 (Week 2): Pt will complete LB dressing tasks with min A   [place]anier, Thomas Chappell 08/16/2021, 6:27 AM  

## 2021-08-16 NOTE — Progress Notes (Signed)
Occupational Therapy Session Note  Patient Details  Name: Shawn Decker MRN: [id number] Date of Birth: 1945/10/10  Today's Date: 08/16/2021 OT Individual Time: 6606-3016 OT Individual Time Calculation (min): 72 min    Short Term Goals: Week 2:  OT Short Term Goal 1 (Week 2): Pt will complete UB bathing and dressing with set up and CGA seated OT Short Term Goal 2 (Week 2): Pt will complete simple grooming task in standing wiht seated rests sink side after set up with min A OT Short Term Goal 3 (Week 2): Pt will complete toileting tasks with mod A (2/3 tasks) OT Short Term Goal 4 (Week 2): Pt will complete LB dressing tasks with min A  Skilled Therapeutic Interventions/Progress Updates:    Pt resting in bed upon arrival. Bed mobility with supervision (HOB elevated and using bed rails.) Sitting balance with supervision to thread BLE into pants. Sit<>stand from EOB with mod A. Standing balance to pull pants over hips with mod A this morning. Repeat sit<>stand from EOB with min A. Stand pivot transfer with min A to w/c. Grooming at sink with supervision/setup. 7 mins NuStep level 4. Sit<>stand from w/c and NuStep with min A. Table tasks: 9 hole peg test-R 39.67 secs, L 44.45 secs, removing caps from medicine bottles and placing small beads into bottles with supervision and more then a reasonable amount of time to complete task. Pt returned to room. Belt alarm activated. All needs within reach.   Therapy Documentation Precautions:  Precautions Precautions: Cervical Precaution Booklet Issued: No Precaution Comments: verbally reviewed 3/3 cervical precautions Required Braces or Orthoses: Cervical Brace Cervical Brace: Hard collar, At all times Restrictions Weight Bearing Restrictions: No   Pain:  Pt reports his back still has a "twinge" but is better; no other complaints   Therapy/Group: Individual Therapy  Rich Brave 08/16/2021, 8:13 AM

## 2021-08-17 LAB — GLUCOSE, CAPILLARY
Glucose-Capillary: 102 mg/dL — ABNORMAL HIGH (ref 70–99)
Glucose-Capillary: 116 mg/dL — ABNORMAL HIGH (ref 70–99)
Glucose-Capillary: 124 mg/dL — ABNORMAL HIGH (ref 70–99)
Glucose-Capillary: 142 mg/dL — ABNORMAL HIGH (ref 70–99)

## 2021-08-17 NOTE — Progress Notes (Signed)
Occupational Therapy Session Note  Patient Details  Name: Shawn Decker MRN: [id number] Date of Birth: 1945/06/21  Today's Date: 08/17/2021 OT Individual Time: 1515-1630 OT Individual Time Calculation (min): 75 min    Short Term Goals: Week 2:  OT Short Term Goal 1 (Week 2): Pt will complete UB bathing and dressing with set up and CGA seated OT Short Term Goal 2 (Week 2): Pt will complete simple grooming task in standing wiht seated rests sink side after set up with min A OT Short Term Goal 3 (Week 2): Pt will complete toileting tasks with mod A (2/3 tasks) OT Short Term Goal 4 (Week 2): Pt will complete LB dressing tasks with min A  Skilled Therapeutic Interventions/Progress Updates:    Upon OT arrival, pt semi recumbent in bed and reports no pain. Agreeable to OT treatment session and requesting to use the bathroom. Pt completes supine to sit transfer with SBA and completes sit to stand with sara stedy with CGA. Pt completes toilet transfer with CGA, toileting with Mod A, and hand hygiene with Setup. Pt returns to EOB using sara stedy with CGA. Pt sits EOB to engage in doffing/donning footwear using adaptive equipment including dressing stick, reacher, sock aid. Pt requires increased time/effort and rest breaks to complete. Pt able to doff/donn socks with SBA, and donn shoes with Mod A. Pt was transported to dayroom gym via w/c and total A for time management. Pt sits at tabletop to flip over quirkle pieces to the colored side using B UE with 1.5lb wrist weights. Pt sorted all game pieces based on shape without errors or rest breaks. Therapist split game tiles in half with half on L side of pt and half on R side. Pt reaches across midline to retrieve game pieces one at a time and transport to bag located on opposite side performing an arc motion to improve strength and endurance. Pt noted to demonstrate decreased endurance and strength in the R UE versus the L UE. Pt able to put all game pieces away  and was transported back to his room via w/c and total A. Pt completes stand pivot transfer from w/c to bed and sit to supine transfer with SBA. Pt left in bed with all safety measures in place and needs in reach. Pt demonstrating progress towards stated OT goals and continues to benefit from OT services to achieve highest level of independence.   Therapy Documentation Precautions:  Precautions Precautions: Cervical Precaution Booklet Issued: No Precaution Comments: verbally reviewed 3/3 cervical precautions Required Braces or Orthoses: Cervical Brace Cervical Brace: Hard collar, At all times Restrictions Weight Bearing Restrictions: No   Therapy/Group: Individual Therapy  Carolin Sicks 08/17/2021, 4:56 PM

## 2021-08-17 NOTE — Progress Notes (Signed)
PROGRESS NOTE   Subjective/Complaints:  Pt reports LBM yesterday- pain meds help, but without meds- pain still 5-7/10- Esp helps at night.   ROS:  Pt denies SOB, abd pain, CP, N/V/C/D, and vision changes  Objective:   No results found. No results for input(s): "WBC", "HGB", "HCT", "PLT" in the last 72 hours.  Recent Labs    08/15/21 0548 08/16/21 1505  NA 135 133*  K 4.4 4.8  CL 100 97*  CO2 25 26  GLUCOSE 103* 124*  BUN 24* 32*  CREATININE 1.31* 1.71*  CALCIUM 8.7* 9.5    Intake/Output Summary (Last 24 hours) at 08/17/2021 0755 Last data filed at 08/16/2021 2138 Gross per 24 hour  Intake 560 ml  Output 300 ml  Net 260 ml        Physical Exam: Vital Signs Blood pressure 137/65, pulse 71, temperature 98.7 F (37.1 C), temperature source Oral, resp. rate 14, height 5\' 8"  (1.727 m), weight 80.9 kg, SpO2 99 %.        General: awake, alert, appropriate, sitting up in bed; eating; NAD HENT: conjugate gaze; oropharynx moist- cervical collar in place CV: regular rate; no JVD Pulmonary: CTA B/L; no W/R/R- good air movement GI: soft, NT, ND, (+)BS Psychiatric: appropriate Neurological: Ox3 Skin- has posterior cervical incision- looks good- no erythema- no drainage; staples intact Musculoskeletal:     Cervical back: Neck supple. No tenderness.     Comments: RUE strength deltoid 5-/5; biceps 5/5; triceps 5-/5; WE 5-/5; Grip 5/5; LUE 4/5 Delt , bi tri grip LE strength HF 4/5; KE/KF 4+/5; DF 4-/5 and PF 4+/5 B/L  B/L no effusions of B/L knees  Skin:    General: Skin is warm and dry.     Comments: Multiple healed abrasions bilateral shins? Dressing posterior neck with dry dressing with dry blood noted. Has staples intact in posterior neck incision  Neurological:     Mental Status: He is alert and oriented to person, place, and time.     Comments: Speech clear with good breath support but cleared throat  multiple times during conversation.  Decreased to light touch in tips of fingers and toes- but not in dermatomal distribution B/L  Absent DTRs in LE's B/L   Assessment/Plan: 1. Functional deficits which require 3+ hours per day of interdisciplinary therapy in a comprehensive inpatient rehab setting. Physiatrist is providing close team supervision and 24 hour management of active medical problems listed below. Physiatrist and rehab team continue to assess barriers to discharge/monitor patient progress toward functional and medical goals  Care Tool:  Bathing    Body parts bathed by patient: Right arm, Left arm, Chest, Abdomen, Front perineal area, Right upper leg, Left upper leg, Face   Body parts bathed by helper: Buttocks, Right lower leg, Left lower leg     Bathing assist Assist Level: Moderate Assistance - Patient 50 - 74%     Upper Body Dressing/Undressing Upper body dressing   What is the patient wearing?: Pull over shirt, Orthosis Orthosis activity level: Performed by helper  Upper body assist Assist Level: Minimal Assistance - Patient > 75%    Lower Body Dressing/Undressing Lower body dressing  What is the patient wearing?: Pants     Lower body assist Assist for lower body dressing: Moderate Assistance - Patient 50 - 74%     Toileting Toileting    Toileting assist Assist for toileting: Total Assistance - Patient < 25%     Transfers Chair/bed transfer  Transfers assist     Chair/bed transfer assist level: Minimal Assistance - Patient > 75%     Locomotion Ambulation   Ambulation assist      Assist level: 2 helpers Assistive device: Walker-rolling Max distance: 15'   Walk 10 feet activity   Assist  Walk 10 feet activity did not occur: Safety/medical concerns  Assist level: 2 helpers Assistive device: Walker-rolling   Walk 50 feet activity   Assist Walk 50 feet with 2 turns activity did not occur: Safety/medical concerns  Assist  level: 2 helpers Assistive device: Walker-rolling    Walk 150 feet activity   Assist Walk 150 feet activity did not occur: Safety/medical concerns         Walk 10 feet on uneven surface  activity   Assist Walk 10 feet on uneven surfaces activity did not occur: Safety/medical concerns         Wheelchair     Assist Is the patient using a wheelchair?: Yes Type of Wheelchair: Manual    Wheelchair assist level: Supervision/Verbal cueing Max wheelchair distance: 100'    Wheelchair 50 feet with 2 turns activity    Assist        Assist Level: Supervision/Verbal cueing   Wheelchair 150 feet activity     Assist      Assist Level: Dependent - Patient 0%   Blood pressure 137/65, pulse 71, temperature 98.7 F (37.1 C), temperature source Oral, resp. rate 14, height 5\' 8"  (1.727 m), weight 80.9 kg, SpO2 99 %.  Medical Problem List and Plan: 1. 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  Antithrombotics: -DVT/anticoagulation:  Mechanical: Sequential compression devices, below knee Bilateral lower extremities   6/23- should be able to start Lovenox over weekend- started 6/24, monitor neuro status and hgb  6/26- Hb stable at 9.7- con't to monitor trend             -antiplatelet therapy: N/A 3. Pain Management: Oxycodone prn. Gabapentin 300 mg bid for neuropathy. Pain adequate with pain meds  6/30- pain tolerable with meds- but gets up to 5-7/10 without meds- con't regimen 4. Mood/Sleep: LCSW to follow for evaluation and assist. Has been sleeping well.              -antipsychotic agents:  N/A 5. Neuropsych/cognition: This patient is capable of making decisions on his own  behalf. 6. Skin/Wound Care: Monitor incision for healing.   6/29- asked nursing to change dressing 7. Fluids/Electrolytes/Nutrition: Monitor I/O. Check CMET in am.  8. Cervical myelopathy s/p decompression C2/3 and C5/6:  --Collar to be on at all times.              --Staples to come out in 2 weeks (July 1st?)  w/NS follow up  6/29-  looking good- will remove staples this weekend             --dysphagia?  SLP for swallow evaluation.  9. HTN: Monitor BP TID. Continue Midamor, normodyne and amlodipine   6/26- BP elevated in 150s-160s systolic- if doesn't improve in next few days, (maxxed out on current BP meds), will check about other options, esp due to renal issues.   6/27- BP doing better 130s-140s in last 24 hours- will con't to monito6/30- BP much better in last 24 hours- con't regimen  Vitals:   08/16/21 1952 08/17/21 0506  BP: 129/68 137/65  Pulse: 79 71  Resp: 14 14  Temp: 98.5 F (36.9 C) 98.7 F (37.1 C)  SpO2: 99% 99%    10. RA: On Arava per Dr. Cardell Peach in [place].  11. CKD 3 a:  BUN/SCr- 27/1.75 at admission-->improved since admisison --monitor with serial checks. Avoid nephrotoxic medications. 6/23- BUN 34 up from 22 and Cr 1.53- not sure exact baseline, but was 1.39 a few days ago- will give IVFs NS 75cc/hour x 24 hours and recheck in AM- think drinking impaired by swallowing issues.   6/27- will check BMP in AM  6/28- Cr 1.31 and NUN 24 down from 35- wil d/c IV  6/30- Cr 1.71- BUN up to 32- not sure if pt isn't drinking vs this his baseline? Will push fluids and recheck in AM    Latest Ref Rng & Units 08/16/2021    3:05 PM 08/15/2021    5:48 AM 08/10/2021    8:12 AM  BMP  Glucose 70 - 99 mg/dL 948  546  270   BUN 8 - 23 mg/dL 32  24  35   Creatinine 0.61 - 1.24 mg/dL 3.50  0.93  8.18   Sodium 135 - 145 mmol/L 133  135  132   Potassium 3.5 - 5.1 mmol/L 4.8  4.4  5.0   Chloride 98 - 111 mmol/L 97  100  99   CO2 22 - 32 mmol/L 26  25  24    Calcium 8.9 - 10.3 mg/dL 9.5  8.7  9.7      12. Acute on chronic anemia: recheck 6/26  6/26- stable- Hb 9.7    Latest Ref Rng & Units 08/13/2021    6:12 AM 08/10/2021    5:11 AM 08/07/2021    1:45 AM  CBC  WBC 4.0 - 10.5 K/uL 6.3  8.8  10.1   Hemoglobin 13.0 - 17.0 g/dL 9.7  9.7  8.8   Hematocrit 39.0 - 52.0 % 30.1  29.2  27.6   Platelets 150 - 400 K/uL 232  200  193     13. Urinary urgency with incontinence- using condom cath since has severe urgency- will likely need timed voiding during day.     I spent a total of 38   minutes on total care today- >50% coordination of care- due to d/w PA and deciding to not do IVFs today- will wait and recheck in AM   LOS: 8 days A FACE TO FACE EVALUATION WAS PERFORMED  Shawn Decker 08/17/2021, 7:55 AM

## 2021-08-17 NOTE — Progress Notes (Signed)
Recreational Therapy Session Note  Patient Details  Name: Vidal Lampkins MRN: [id number] Date of Birth: May 11, 1945 Today's Date: 08/17/2021  Pain: no c/o Skilled Therapeutic Interventions/Progress Updates: Goal:  Pt will maintain dynamic standing balance during min complex leisure task with min assist.  MET  Session focused on activity tolerance safety awareness and dynamic standing balance during cotreat with PT.  Pt in bed upon arrival.  Pt assisted pt with donning pants and stand pivot transfer to w/c using RW.  Pt propelled w/c from his room to the dayroom using BUEs with supervision.  Pt ambulated with RW ~80' with contact guard assist and w/c follow.  Pt required min cues for knee control during ambulation.  Pt stood with 1UE support to pitch horseshoes using RUE.  Pt required contact guard - min assist and min cues for weight shifting and knee control.  Incorporated music as pt enjoys listening and making "reels" at home.  Therapy/Group: Co-Treatment   Austin Pongratz 08/17/2021, 9:39 AM

## 2021-08-17 NOTE — Progress Notes (Signed)
Occupational Therapy Session Note  Patient Details  Name: Thanh Mottern MRN: [id number] Date of Birth: 1945-09-01  Today's Date: 08/17/2021 OT Individual Time: 1015-1130 OT Individual Time Calculation (min): 75 min    Short Term Goals: Week 2:  OT Short Term Goal 1 (Week 2): Pt will complete UB bathing and dressing with set up and CGA seated OT Short Term Goal 2 (Week 2): Pt will complete simple grooming task in standing wiht seated rests sink side after set up with min A OT Short Term Goal 3 (Week 2): Pt will complete toileting tasks with mod A (2/3 tasks) OT Short Term Goal 4 (Week 2): Pt will complete LB dressing tasks with min A  Skilled Therapeutic Interventions/Progress Updates:    Upon OT arrival, pt seated in w/c requesting to get cleaned up. Pt reports 2/10 pain in neck. Pt completes sponge bath ADL at the levels below, toileting and toilet transfer at the levels below and sit to stand/stand to sit transfers with Min A. Pt making progress towards stated OT goals and continues to benefit from OT services to achieve highest level of independence. Pt utilizes reacher to complete LB dressing and pt's aspen collar pads were changed by this therapist. Pt completes stand pivot transfer to bed from w/c with Min A and sit to supine transfer with Mod A. Pt left in bed with all needs met and safety measures in place.   Therapy Documentation Precautions:  Precautions Precautions: Cervical Precaution Booklet Issued: No Precaution Comments: verbally reviewed 3/3 cervical precautions Required Braces or Orthoses: Cervical Brace Cervical Brace: Hard collar, At all times Restrictions Weight Bearing Restrictions: No  ADL: Grooming: Supervision/safety Where Assessed-Grooming: Sitting at sink, Wheelchair Upper Body Bathing: Supervision/safety Where Assessed-Upper Body Bathing: Sitting at sink Lower Body Bathing: Moderate assistance Where Assessed-Lower Body Bathing: Standing at sink, Sitting at  sink, Wheelchair Upper Body Dressing: Minimal assistance Where Assessed-Upper Body Dressing: Wheelchair, Sitting at sink Lower Body Dressing: Minimal assistance Where Assessed-Lower Body Dressing: Sitting at sink, Standing at sink, Wheelchair Toileting: Minimal assistance Where Assessed-Toileting: Glass blower/designer: Psychiatric nurse Method: Stand Ecologist: Drop arm bedside commode, Grab bars   Therapy/Group: Individual Therapy  Marvetta Gibbons 08/17/2021, 12:15 PM

## 2021-08-17 NOTE — Progress Notes (Signed)
Physical Therapy Session Note  Patient Details  Name: Shawn Decker MRN: [id number] Date of Birth: Dec 06, 1945  Today's Date: 08/17/2021 PT Individual Time: 0805-0905 PT Individual Time Calculation (min): 60 min   Short Term Goals: Week 2:  PT Short Term Goal 1 (Week 2): Pt will ambulate x 100 ft with LRAD and assist x 1 consistently PT Short Term Goal 2 (Week 2): Pt will navigate 6" stairs with 2 handrails and mod A PT Short Term Goal 3 (Week 2): Pt will propel w/c x 150 ft at Supervision level  Skilled Therapeutic Interventions/Progress Updates: Pt presented in bed agreeable to therapy. Pt states some mild pain at incision site but did not rate, no c/o pain on L flank. Pt performed supine to sit with minA and use of bed features. Lattie Haw, RT present for session. Pt threaded and donned pants with use of reacher, increased time and supervision. PTA donned shoes total A for time management. Pt then performed Sit to stand from elevated bed with CGA and performed stand pivot transfer to w/c with minA. Pt propelled to day room with supervision for general conditioning. After brief rest pt ambulated x5f with RW CGA and w/c follow for safety. Pt initially maintaining TKE in stance phase but with fatigue noted increased B knee flexion. Pt then participated in standing dynamic reaching task of playing several rounds of horseshoes. Pt able to tolerate min to moderate reaching tasks with CGA but requiring cues for increased lower core activation to stabilize pelvis. From w/c performed stand pivot to NuStep and participated in NuStep L4 x 10 min for general conditioning. Once completed pt transferred to w/c via squat pivot and CGA. Pt transported back to room and remained in w/c at end of session, pt left with belt alarm on, call bell within reach and needs met.      Therapy Documentation Precautions:  Precautions Precautions: Cervical Precaution Booklet Issued: No Precaution Comments: verbally reviewed 3/3  cervical precautions Required Braces or Orthoses: Cervical Brace Cervical Brace: Hard collar, At all times Restrictions Weight Bearing Restrictions: No General:   Vital Signs:   Pain:   Mobility:   Locomotion :    Trunk/Postural Assessment :    Balance:   Exercises:   Other Treatments:      Therapy/Group: Individual Therapy  Kemya Shed 08/17/2021, 1:34 PM

## 2021-08-18 LAB — BASIC METABOLIC PANEL
Anion gap: 7 (ref 5–15)
BUN: 27 mg/dL — ABNORMAL HIGH (ref 8–23)
CO2: 26 mmol/L (ref 22–32)
Calcium: 8.9 mg/dL (ref 8.9–10.3)
Chloride: 101 mmol/L (ref 98–111)
Creatinine, Ser: 1.55 mg/dL — ABNORMAL HIGH (ref 0.61–1.24)
GFR, Estimated: 46 mL/min — ABNORMAL LOW (ref >60)
Glucose, Bld: 100 mg/dL — ABNORMAL HIGH (ref 70–99)
Potassium: 4.4 mmol/L (ref 3.5–5.1)
Sodium: 134 mmol/L — ABNORMAL LOW (ref 135–145)

## 2021-08-18 LAB — GLUCOSE, CAPILLARY
Glucose-Capillary: 98 mg/dL (ref 70–99)
Glucose-Capillary: 116 mg/dL — ABNORMAL HIGH (ref 70–99)
Glucose-Capillary: 134 mg/dL — ABNORMAL HIGH (ref 70–99)
Glucose-Capillary: 137 mg/dL — ABNORMAL HIGH (ref 70–99)

## 2021-08-18 NOTE — Plan of Care (Signed)
  Problem: Consults Goal: RH SPINAL CORD INJURY PATIENT EDUCATION Description:  See Patient Education module for education specifics.  Outcome: Progressing Goal: Skin Care Protocol Initiated - if Braden Score 18 or less Description: If consults are not indicated, leave blank or document N/A Outcome: Progressing   Problem: SCI BLADDER ELIMINATION Goal: RH STG MANAGE BLADDER WITH ASSISTANCE Description: STG Manage Bladder With Supervision Assistance Outcome: Progressing   Problem: RH SKIN INTEGRITY Goal: RH STG MAINTAIN SKIN INTEGRITY WITH ASSISTANCE Description: STG Maintain Skin Integrity With Supervision Assistance. Outcome: Progressing   Problem: RH SAFETY Goal: RH STG ADHERE TO SAFETY PRECAUTIONS W/ASSISTANCE/DEVICE Description: STG Adhere to Safety Precautions With Cues and reminders. Outcome: Progressing Goal: RH STG DECREASED RISK OF FALL WITH ASSISTANCE Description: STG Decreased Risk of Fall With Supervision Assistance. Outcome: Progressing   Problem: RH PAIN MANAGEMENT Goal: RH STG PAIN MANAGED AT OR BELOW PT'S PAIN GOAL Description: < 3 on a 0-10 pain scale. Outcome: Progressing   Problem: RH KNOWLEDGE DEFICIT SCI Goal: RH STG INCREASE KNOWLEDGE OF SELF CARE AFTER SCI Description: Patient will demonstrate knowledge of self-care management, medication/pain management, skin/wound care, safety awareness with educational materials and handouts provided by staff independently at discharge. Outcome: Progressing

## 2021-08-18 NOTE — Progress Notes (Signed)
PROGRESS NOTE   Subjective/Complaints:  Pt reports no issues LBM yesterday Slept OK  BUN down to 27 and Cr down to 1.55 with pushing fluids- wil not do IVFs right now.    ROS:  Pt denies SOB, abd pain, CP, N/V/C/D, and vision changes  Objective:   No results found. No results for input(s): "WBC", "HGB", "HCT", "PLT" in the last 72 hours.  Recent Labs    08/16/21 1505 08/18/21 0548  NA 133* 134*  K 4.8 4.4  CL 97* 101  CO2 26 26  GLUCOSE 124* 100*  BUN 32* 27*  CREATININE 1.71* 1.55*  CALCIUM 9.5 8.9    Intake/Output Summary (Last 24 hours) at 08/18/2021 1032 Last data filed at 08/18/2021 0900 Gross per 24 hour  Intake 936 ml  Output 1700 ml  Net -764 ml        Physical Exam: Vital Signs Blood pressure 133/67, pulse 69, temperature 97.8 F (36.6 C), temperature source Oral, resp. rate 14, height 5\' 8"  (1.727 m), weight 80.9 kg, SpO2 99 %.        General: awake, alert, appropriate, sitting up in bed; NAD HENT: conjugate gaze; oropharynx moist-wearing cervical collar CV: regular rate; no JVD Pulmonary: CTA B/L; no W/R/R- good air movement GI: soft, NT, ND, (+)BS Psychiatric: appropriate but flat- chronic Neurological: Ox3  Skin- has posterior cervical incision- looks good- no erythema- no drainage; staples intact Musculoskeletal:     Cervical back: Neck supple. No tenderness.     Comments: RUE strength deltoid 5-/5; biceps 5/5; triceps 5-/5; WE 5-/5; Grip 5/5; LUE 4/5 Delt , bi tri grip LE strength HF 4/5; KE/KF 4+/5; DF 4-/5 and PF 4+/5 B/L  B/L no effusions of B/L knees  Skin:    General: Skin is warm and dry.     Comments: Multiple healed abrasions bilateral shins? Dressing posterior neck with dry dressing with dry blood noted. Has staples intact in posterior neck incision  Neurological:     Mental Status: He is alert and oriented to person, place, and time.     Comments: Speech clear with  good breath support but cleared throat multiple times during conversation.  Decreased to light touch in tips of fingers and toes- but not in dermatomal distribution B/L  Absent DTRs in LE's B/L   Assessment/Plan: 1. Functional deficits which require 3+ hours per day of interdisciplinary therapy in a comprehensive inpatient rehab setting. Physiatrist is providing close team supervision and 24 hour management of active medical problems listed below. Physiatrist and rehab team continue to assess barriers to discharge/monitor patient progress toward functional and medical goals  Care Tool:  Bathing    Body parts bathed by patient: Right arm, Left arm, Chest, Abdomen, Front perineal area, Right upper leg, Left upper leg, Face   Body parts bathed by helper: Buttocks, Right lower leg, Left lower leg     Bathing assist Assist Level: Minimal Assistance - Patient > 75%     Upper Body Dressing/Undressing Upper body dressing   What is the patient wearing?: Pull over shirt, Orthosis Orthosis activity level: Performed by helper  Upper body assist Assist Level: Minimal Assistance - Patient > 75%  Lower Body Dressing/Undressing Lower body dressing      What is the patient wearing?: Pants, Incontinence brief     Lower body assist Assist for lower body dressing: Minimal Assistance - Patient > 75%     Toileting Toileting    Toileting assist Assist for toileting: Moderate Assistance - Patient 50 - 74%     Transfers Chair/bed transfer  Transfers assist     Chair/bed transfer assist level: Minimal Assistance - Patient > 75%     Locomotion Ambulation   Ambulation assist      Assist level: 2 helpers Assistive device: Walker-rolling Max distance: 70'   Walk 10 feet activity   Assist  Walk 10 feet activity did not occur: Safety/medical concerns  Assist level: 2 helpers Assistive device: Walker-rolling   Walk 50 feet activity   Assist Walk 50 feet with 2 turns  activity did not occur: Safety/medical concerns  Assist level: 2 helpers Assistive device: Walker-rolling    Walk 150 feet activity   Assist Walk 150 feet activity did not occur: Safety/medical concerns         Walk 10 feet on uneven surface  activity   Assist Walk 10 feet on uneven surfaces activity did not occur: Safety/medical concerns         Wheelchair     Assist Is the patient using a wheelchair?: Yes Type of Wheelchair: Manual    Wheelchair assist level: Supervision/Verbal cueing Max wheelchair distance: 100'    Wheelchair 50 feet with 2 turns activity    Assist        Assist Level: Supervision/Verbal cueing   Wheelchair 150 feet activity     Assist      Assist Level: Dependent - Patient 0%   Blood pressure 133/67, pulse 69, temperature 97.8 F (36.6 C), temperature source Oral, resp. rate 14, height 5\' 8"  (1.727 m), weight 80.9 kg, SpO2 99 %.  Medical Problem List and Plan: 1. 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  Antithrombotics: -DVT/anticoagulation:  Mechanical: Sequential compression devices, below knee Bilateral lower extremities   6/23- should be able to start Lovenox over weekend- started 6/24, monitor neuro status and hgb  6/26- Hb stable at 9.7- con't to monitor trend             -antiplatelet therapy: N/A 3. Pain Management: Oxycodone prn. Gabapentin 300 mg bid for neuropathy. Pain adequate with pain meds  7/1- pain tolerable with meds- con't to encourage to ask if need it 4. Mood/Sleep: LCSW to follow for evaluation and assist. Has been sleeping well.              -antipsychotic agents:  N/A 5. Neuropsych/cognition: This patient is  capable of making decisions on his own behalf. 6. Skin/Wound Care: Monitor incision for healing.   6/29- asked nursing to change dressing 7. Fluids/Electrolytes/Nutrition: Monitor I/O. Check CMET in am.  8. Cervical myelopathy s/p decompression C2/3 and C5/6:  --Collar to be on at all times.              --Staples to come out in  2 weeks (July 1st?)  w/NS follow up  7/1- remove staples today- placed order and ordered steristrips             --dysphagia?  SLP for swallow evaluation.  9. HTN: Monitor BP TID. Continue Midamor, normodyne and amlodipine   6/26- BP elevated in 150s-160s systolic- if doesn't improve in next few days, (maxxed out on current BP meds), will check about other options, esp due to renal issues.   7/1- BP slightly variable, but overall better- con't regimen  Vitals:   08/17/21 1948 08/18/21 0506  BP: (!) 152/67 133/67  Pulse: 86 69  Resp: 14 14  Temp: 98.5 F (36.9 C) 97.8 F (36.6 C)  SpO2: 98% 99%    10. RA: On Arava per Dr. Cardell Peach in [place].  11. CKD 3 a:  BUN/SCr- 27/1.75 at admission-->improved since admisison --monitor with serial checks. Avoid nephrotoxic medications. 6/23- BUN 34 up from 22 and Cr 1.53- not sure exact baseline, but was 1.39 a few days ago- will give IVFs NS 75cc/hour x 24 hours and recheck in AM- think drinking impaired by swallowing issues.   6/27- will check BMP in AM  6/28- Cr 1.31 and NUN 24 down from 35- wil d/c IV  6/30- Cr 1.71- BUN up to 32- not sure if pt isn't drinking vs this his baseline? Will push fluids and recheck in AM  7/1- BUN 27 and Cr 1.55- down from 52 and 1.71- will recheck Monday    Latest Ref Rng & Units 08/18/2021    5:48 AM 08/16/2021    3:05 PM 08/15/2021    5:48 AM  BMP  Glucose 70 - 99 mg/dL 962  952  841   BUN 8 - 23 mg/dL 27  32  24   Creatinine 0.61 - 1.24 mg/dL 3.24  4.01  0.27   Sodium 135 - 145 mmol/L 134  133  135   Potassium 3.5 - 5.1 mmol/L 4.4  4.8  4.4   Chloride 98 - 111 mmol/L 101  97  100   CO2 22 -  32 mmol/L 26  26  25    Calcium 8.9 - 10.3 mg/dL 8.9  9.5  8.7     12. Acute on chronic anemia: recheck 6/26  6/26- stable- Hb 9.7    Latest Ref Rng & Units 08/13/2021    6:12 AM 08/10/2021    5:11 AM 08/07/2021    1:45 AM  CBC  WBC 4.0 - 10.5 K/uL 6.3  8.8  10.1   Hemoglobin 13.0 - 17.0 g/dL 9.7  9.7  8.8   Hematocrit 39.0 - 52.0 % 30.1  29.2  27.6   Platelets 150 - 400 K/uL 232  200  193     13. Urinary urgency with incontinence- using condom cath since has severe urgency- will likely need timed voiding during day.    I spent a total of  36  minutes on total care today- >50% coordination of care- due to d/w nursing about pushing fluids and d/w pt about kidney issues    LOS: 9 days A FACE TO FACE EVALUATION WAS PERFORMED  Miko Markwood 08/18/2021, 10:32 AM

## 2021-08-19 LAB — GLUCOSE, CAPILLARY
Glucose-Capillary: 101 mg/dL — ABNORMAL HIGH (ref 70–99)
Glucose-Capillary: 112 mg/dL — ABNORMAL HIGH (ref 70–99)
Glucose-Capillary: 119 mg/dL — ABNORMAL HIGH (ref 70–99)

## 2021-08-19 NOTE — Progress Notes (Signed)
Physical Therapy Session Note  Patient Details  Name: Shawn Decker MRN: [id number] Date of Birth: [birth year]  Today's Date: 08/19/2021 PT Individual Time: 2774-1287 PT Individual Time Calculation (min): 72 min   Short Term Goals: Week 2:  PT Short Term Goal 1 (Week 2): Pt will ambulate x 100 ft with LRAD and assist x 1 consistently PT Short Term Goal 2 (Week 2): Pt will navigate 6" stairs with 2 handrails and mod A PT Short Term Goal 3 (Week 2): Pt will propel w/c x 150 ft at Supervision level  Skilled Therapeutic Interventions/Progress Updates:    Pt presents in bed and agreeable to session. CGA for bed mobility to come to EOB with use of hospital bed features. Pt overall reports stiffness from not moving yet much today. Assisted with donning of pants EOB and total assist for shoes for time management. Performed min assist for sit > stand with RW with facilitation for anterior weightshift and cues for knee extension and upright posture while adjusting pants. Short distance gait to the sink and standing with BUE support and 1 UE support while performing oral hygiene with min assist for balance. Pt reports decreased endurance today and requires seated rest break and washed face from w/c level mod I. Min assist for stand step transfer to Nustep to focus on reciprocal movement pattern retraining for NMR and functional strengthening and endurance of BUE/BLE on level 4 x 10 min. NMR to address sit <> stands and balance retraining from mat table with focus on technique, upright posture,coordination and standing tolerance. Performed alternating toe taps to 4" step with BUE support focusing on coordination and balance x 10 reps x 2 sets each with min assist for balance. Functional reaching task to collect horseshoes and throw with R and L UE to various heights with CGA for balance x 2 reps each with seated rest break due to fatigue. Cues for knee and hip extension for upright posture. Short distance gait with RW  x 10' and x 40' with min assist overall with focus on upright posture, knee extension (tendency for crouched posture) and widened BOS. End of session performed short distance gait with RW in room with min assist and set up in recliner with all needs in reach.   Therapy Documentation Precautions:  Precautions Precautions: Cervical Precaution Booklet Issued: No Precaution Comments: verbally reviewed 3/3 cervical precautions Required Braces or Orthoses: Cervical Brace Cervical Brace: Hard collar, At all times Restrictions Weight Bearing Restrictions: No  Pain: Reports some soreness in his neck - repositioned/rest breaks as needed.  End of session request pain medication for L hip. Did not rate.       Therapy/Group: Individual Therapy  Karolee Stamps Darrol Poke, PT, DPT, CBIS  08/19/2021, 10:00 AM

## 2021-08-19 NOTE — Progress Notes (Signed)
PROGRESS NOTE   Subjective/Complaints:  Pt reports he got staples out yesterday- went well.  We discussed that he needs to drink at least 6 cups/day of water to try and keep hydrated/kidneys "happy".  Pt said he'd try.    ROS:  Pt denies SOB, abd pain, CP, N/V/C/D, and vision changes   Objective:   No results found. No results for input(s): "WBC", "HGB", "HCT", "PLT" in the last 72 hours.  Recent Labs    08/16/21 1505 08/18/21 0548  NA 133* 134*  K 4.8 4.4  CL 97* 101  CO2 26 26  GLUCOSE 124* 100*  BUN 32* 27*  CREATININE 1.71* 1.55*  CALCIUM 9.5 8.9    Intake/Output Summary (Last 24 hours) at 08/19/2021 0929 Last data filed at 08/19/2021 [id number] Gross per 24 hour  Intake 1398 ml  Output 1600 ml  Net -202 ml        Physical Exam: Vital Signs Blood pressure 136/61, pulse 69, temperature 98.4 F (36.9 C), resp. rate 17, height 5\' 8"  (1.727 m), weight 77.5 kg, SpO2 100 %.         General: awake, alert, appropriate, sitting up watching TV; NAD HENT: conjugate gaze; oropharynx moist- cervical collar in place CV: regular rate; no JVD Pulmonary: CTA B/L; no W/R/R- good air movement GI: soft, NT, ND, (+)BS Psychiatric: appropriate Neurological: Ox3  Skin- posterior incision with steristrips- looks good Musculoskeletal:     Cervical back: Neck supple. No tenderness.     Comments: RUE strength deltoid 5-/5; biceps 5/5; triceps 5-/5; WE 5-/5; Grip 5/5; LUE 4/5 Delt , bi tri grip LE strength HF 4/5; KE/KF 4+/5; DF 4-/5 and PF 4+/5 B/L  B/L no effusions of B/L knees  Skin:    General: Skin is warm and dry.     Comments: Multiple healed abrasions bilateral shins? Dressing posterior neck with dry dressing with dry blood noted. Has staples intact in posterior neck incision  Neurological:     Mental Status: He is alert and oriented to person, place, and time.     Comments: Speech clear with good breath  support but cleared throat multiple times during conversation.  Decreased to light touch in tips of fingers and toes- but not in dermatomal distribution B/L  Absent DTRs in LE's B/L   Assessment/Plan: 1. Functional deficits which require 3+ hours per day of interdisciplinary therapy in a comprehensive inpatient rehab setting. Physiatrist is providing close team supervision and 24 hour management of active medical problems listed below. Physiatrist and rehab team continue to assess barriers to discharge/monitor patient progress toward functional and medical goals  Care Tool:  Bathing    Body parts bathed by patient: Right arm, Left arm, Chest, Abdomen, Front perineal area, Right upper leg, Left upper leg, Face   Body parts bathed by helper: Buttocks, Right lower leg, Left lower leg     Bathing assist Assist Level: Minimal Assistance - Patient > 75%     Upper Body Dressing/Undressing Upper body dressing   What is the patient wearing?: Pull over shirt, Orthosis Orthosis activity level: Performed by helper  Upper body assist Assist Level: Minimal Assistance - Patient > 75%  Lower Body Dressing/Undressing Lower body dressing      What is the patient wearing?: Pants, Incontinence brief     Lower body assist Assist for lower body dressing: Minimal Assistance - Patient > 75%     Toileting Toileting    Toileting assist Assist for toileting: Moderate Assistance - Patient 50 - 74%     Transfers Chair/bed transfer  Transfers assist     Chair/bed transfer assist level: Minimal Assistance - Patient > 75%     Locomotion Ambulation   Ambulation assist      Assist level: 2 helpers Assistive device: Walker-rolling Max distance: 53'   Walk 10 feet activity   Assist  Walk 10 feet activity did not occur: Safety/medical concerns  Assist level: 2 helpers Assistive device: Walker-rolling   Walk 50 feet activity   Assist Walk 50 feet with 2 turns activity did not  occur: Safety/medical concerns  Assist level: 2 helpers Assistive device: Walker-rolling    Walk 150 feet activity   Assist Walk 150 feet activity did not occur: Safety/medical concerns         Walk 10 feet on uneven surface  activity   Assist Walk 10 feet on uneven surfaces activity did not occur: Safety/medical concerns         Wheelchair     Assist Is the patient using a wheelchair?: Yes Type of Wheelchair: Manual    Wheelchair assist level: Supervision/Verbal cueing Max wheelchair distance: 100'    Wheelchair 50 feet with 2 turns activity    Assist        Assist Level: Supervision/Verbal cueing   Wheelchair 150 feet activity     Assist      Assist Level: Dependent - Patient 0%   Blood pressure 136/61, pulse 69, temperature 98.4 F (36.9 C), resp. rate 17, height 5\' 8"  (1.727 m), weight 77.5 kg, SpO2 100 %.  Medical Problem List and Plan: 1. 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  Antithrombotics: -DVT/anticoagulation:  Mechanical: Sequential compression devices, below knee Bilateral lower extremities  6/23- should be able to start Lovenox over weekend- started 6/24, monitor neuro status and hgb 6/26- Hb stable at 9.7- con't to monitor trend             -antiplatelet therapy: N/A 3. Pain Management: Oxycodone prn. Gabapentin 300 mg bid for neuropathy. Pain adequate with pain meds  7/1- pain tolerable with meds- con't to encourage to ask if need it 4. Mood/Sleep: LCSW to follow for evaluation and assist. Has been sleeping well.              -antipsychotic agents:  N/A 5. Neuropsych/cognition: This patient is capable of making decisions on his own  behalf. 6. Skin/Wound Care: Monitor incision for healing.   6/29- asked nursing to change dressing  7/- staples removed 7. Fluids/Electrolytes/Nutrition: Monitor I/O. Check CMET in am.  8. Cervical myelopathy s/p decompression C2/3 and C5/6:  --Collar to be on at all times.              --Staples to come out in  2 weeks (July 1st?)  w/NS follow up  7/1- remove staples today- placed order and ordered steristrips  7/2- staples out             --dysphagia?  SLP for swallow evaluation.  9. HTN: Monitor BP TID. Continue Midamor, normodyne and amlodipine   6/26- BP elevated in 150s-160s systolic- if doesn't improve in next few days, (maxxed out on current BP meds), will check about other options, esp due to renal issues.   7/2- BP running in 130s systolic- con't regimen Vitals:   08/18/21 1927 08/19/21 0424  BP: 136/63 136/61  Pulse: 73 69  Resp: 18 17  Temp: 98.5 F (36.9 C) 98.4 F (36.9 C)  SpO2: 98% 100%    10. RA: On Arava per Dr. Cardell Peach in [place].  11. CKD 3 a:  BUN/SCr- 27/1.75 at admission-->improved since admisison --monitor with serial checks. Avoid nephrotoxic medications. 6/23- BUN 34 up from 22 and Cr 1.53- not sure exact baseline, but was 1.39 a few days ago- will give IVFs NS 75cc/hour x 24 hours and recheck in AM- think drinking impaired by swallowing issues.   6/27- will check BMP in AM  6/28- Cr 1.31 and NUN 24 down from 35- wil d/c IV  6/30- Cr 1.71- BUN up to 32- not sure if pt isn't drinking vs this his baseline? Will push fluids and recheck in AM  7/1- BUN 27 and Cr 1.55- down from 52 and 1.71- will recheck Monday  7/2- educated pt to drink 6+ cups/water per day- so he can avoid IVFs again.     Latest Ref Rng & Units 08/18/2021    5:48 AM 08/16/2021    3:05 PM 08/15/2021    5:48 AM  BMP  Glucose 70 - 99 mg/dL 237  628  315   BUN 8 - 23 mg/dL 27  32  24   Creatinine 0.61 - 1.24 mg/dL 1.76  1.60  7.37   Sodium 135 - 145 mmol/L 134  133  135   Potassium 3.5 - 5.1 mmol/L  4.4  4.8  4.4   Chloride 98 - 111 mmol/L 101  97  100   CO2 22 - 32 mmol/L 26  26  25    Calcium 8.9 - 10.3 mg/dL 8.9  9.5  8.7     12. Acute on chronic anemia: recheck 6/26  6/26- stable- Hb 9.7    Latest Ref Rng & Units 08/13/2021    6:12 AM 08/10/2021    5:11 AM 08/07/2021    1:45 AM  CBC  WBC 4.0 - 10.5 K/uL 6.3  8.8  10.1   Hemoglobin 13.0 - 17.0 g/dL 9.7  9.7  8.8   Hematocrit 39.0 - 52.0 % 30.1  29.2  27.6   Platelets 150 - 400 K/uL 232  200  193     13. Urinary urgency with incontinence- using condom cath since has severe urgency- will likely need timed voiding during day.      LOS: 10 days A FACE TO FACE EVALUATION WAS PERFORMED  Shawn Decker 08/19/2021, 9:29 AM

## 2021-08-20 DIAGNOSIS — R7989 Other specified abnormal findings of blood chemistry: Secondary | ICD-10-CM

## 2021-08-20 DIAGNOSIS — R1312 Dysphagia, oropharyngeal phase: Secondary | ICD-10-CM

## 2021-08-20 DIAGNOSIS — D62 Acute posthemorrhagic anemia: Secondary | ICD-10-CM

## 2021-08-20 LAB — GLUCOSE, CAPILLARY
Glucose-Capillary: 104 mg/dL — ABNORMAL HIGH (ref 70–99)
Glucose-Capillary: 112 mg/dL — ABNORMAL HIGH (ref 70–99)
Glucose-Capillary: 119 mg/dL — ABNORMAL HIGH (ref 70–99)
Glucose-Capillary: 127 mg/dL — ABNORMAL HIGH (ref 70–99)
Glucose-Capillary: 96 mg/dL (ref 70–99)

## 2021-08-20 LAB — BASIC METABOLIC PANEL
Anion gap: 8 (ref 5–15)
BUN: 21 mg/dL (ref 8–23)
CO2: 26 mmol/L (ref 22–32)
Calcium: 9.1 mg/dL (ref 8.9–10.3)
Chloride: 103 mmol/L (ref 98–111)
Creatinine, Ser: 1.51 mg/dL — ABNORMAL HIGH (ref 0.61–1.24)
GFR, Estimated: 48 mL/min — ABNORMAL LOW (ref >60)
Glucose, Bld: 97 mg/dL (ref 70–99)
Potassium: 4.2 mmol/L (ref 3.5–5.1)
Sodium: 137 mmol/L (ref 135–145)

## 2021-08-20 LAB — CBC
HCT: 29.6 % — ABNORMAL LOW (ref 39.0–52.0)
Hemoglobin: 9.5 g/dL — ABNORMAL LOW (ref 13.0–17.0)
MCH: 27 pg (ref 26.0–34.0)
MCHC: 32.1 g/dL (ref 30.0–36.0)
MCV: 84.1 fL (ref 80.0–100.0)
Platelets: 274 10*3/uL (ref 150–400)
RBC: 3.52 MIL/uL — ABNORMAL LOW (ref 4.22–5.81)
RDW: 14.3 % (ref 11.5–15.5)
WBC: 5.5 10*3/uL (ref 4.0–10.5)
nRBC: 0 % (ref 0.0–0.2)

## 2021-08-20 NOTE — Progress Notes (Signed)
Received pt sitting in chair, with C-collar on stable.

## 2021-08-20 NOTE — Progress Notes (Deleted)
Received pt in bed stable, on the phone talking.

## 2021-08-20 NOTE — Progress Notes (Signed)
Slept good. Wears condom cath at [facility]. LBM 07/02. PRN tylenol given on previous shift for pain. Mostly complaining of posterior neck incision pain. C collar at all times. Continues to complain of numbness & tingling to bilateral hands and feet. Encouraged PO intake. Alfredo Martinez A

## 2021-08-20 NOTE — Progress Notes (Signed)
PROGRESS NOTE   Subjective/Complaints:  Pt up in chair. No new complaints. Says pain is controlled  ROS: Patient denies fever, rash, sore throat, blurred vision, dizziness, nausea, vomiting, diarrhea, cough, shortness of breath or chest pain,   headache, or mood change.    Objective:   No results found. Recent Labs    08/20/21 0624  WBC 5.5  HGB 9.5*  HCT 29.6*  PLT 274    Recent Labs    08/18/21 0548 08/20/21 0624  NA 134* 137  K 4.4 4.2  CL 101 103  CO2 26 26  GLUCOSE 100* 97  BUN 27* 21  CREATININE 1.55* 1.51*  CALCIUM 8.9 9.1    Intake/Output Summary (Last 24 hours) at 08/20/2021 1037 Last data filed at 08/20/2021 8341 Gross per 24 hour  Intake 1018 ml  Output 1550 ml  Net -532 ml        Physical Exam: Vital Signs Blood pressure (!) 150/68, pulse 74, temperature 98.6 F (37 C), temperature source Oral, resp. rate 17, height 5\' 8"  (1.727 m), weight 77.5 kg, SpO2 98 %.         Constitutional: No distress . Vital signs reviewed. HEENT: NCAT, EOMI, oral membranes moist Neck: supple Cardiovascular: RRR without murmur. No JVD    Respiratory/Chest: CTA Bilaterally without wheezes or rales. Normal effort    GI/Abdomen: BS +, non-tender, non-distended Ext: no clubbing, cyanosis, or edema Psych: pleasant and cooperative  Musculoskeletal:     Cervical back: Neck supple. No tenderness.     Comments: RUE strength deltoid 5-/5; biceps 5/5; triceps 5-/5; WE 5-/5; Grip 5/5; LUE 4/5 Delt , bi tri grip LE strength HF 4/5; KE/KF 4+/5; DF 4-/5 and PF 4+/5 B/L  B/L no effusions of B/L knees  Skin:    General: Skin is warm and dry.     Comments: Multiple healed abrasions bilateral shins?neck incision CDI  Neurological:     Alert and oriented x 3. Normal insight and awareness. Intact Memory. Normal language and speech. Cranial nerve exam unremarkable     Comments: Speech clear with good breath support but  cleared throat multiple times during conversation.  Decreased to light touch in tips of fingers and toes- but not in dermatomal distribution B/L --no change Absent DTRs in LE's B/L --no change  Assessment/Plan: 1. Functional deficits which require 3+ hours per day of interdisciplinary therapy in a comprehensive inpatient rehab setting. Physiatrist is providing close team supervision and 24 hour management of active medical problems listed below. Physiatrist and rehab team continue to assess barriers to discharge/monitor patient progress toward functional and medical goals  Care Tool:  Bathing    Body parts bathed by patient: Right arm, Left arm, Chest, Abdomen, Front perineal area, Right upper leg, Left upper leg, Face   Body parts bathed by helper: Buttocks, Right lower leg, Left lower leg     Bathing assist Assist Level: Minimal Assistance - Patient > 75%     Upper Body Dressing/Undressing Upper body dressing   What is the patient wearing?: Pull over shirt, Orthosis Orthosis activity level: Performed by helper  Upper body assist Assist Level: Minimal Assistance - Patient > 75%  Lower Body Dressing/Undressing Lower body dressing      What is the patient wearing?: Pants     Lower body assist Assist for lower body dressing: Contact Guard/Touching assist     Toileting Toileting    Toileting assist Assist for toileting: Moderate Assistance - Patient 50 - 74%     Transfers Chair/bed transfer  Transfers assist     Chair/bed transfer assist level: Minimal Assistance - Patient > 75%     Locomotion Ambulation   Ambulation assist      Assist level: Minimal Assistance - Patient > 75% Assistive device: Walker-rolling Max distance: 40'   Walk 10 feet activity   Assist  Walk 10 feet activity did not occur: Safety/medical concerns  Assist level: Minimal Assistance - Patient > 75% Assistive device: Walker-rolling   Walk 50 feet activity   Assist Walk 50  feet with 2 turns activity did not occur: Safety/medical concerns  Assist level: 2 helpers Assistive device: Walker-rolling    Walk 150 feet activity   Assist Walk 150 feet activity did not occur: Safety/medical concerns         Walk 10 feet on uneven surface  activity   Assist Walk 10 feet on uneven surfaces activity did not occur: Safety/medical concerns         Wheelchair     Assist Is the patient using a wheelchair?: Yes Type of Wheelchair: Manual    Wheelchair assist level: Supervision/Verbal cueing Max wheelchair distance: 100'    Wheelchair 50 feet with 2 turns activity    Assist        Assist Level: Supervision/Verbal cueing   Wheelchair 150 feet activity     Assist      Assist Level: Dependent - Patient 0%   Blood pressure (!) 150/68, pulse 74, temperature 98.6 F (37 C), temperature source Oral, resp. rate 17, height 5\' 8"  (1.727 m), weight 77.5 kg, SpO2 98 %.  Medical Problem List and Plan: 1. Functional deficits secondary to cervical myelopathy prior C3-5 ACDF extended to C2-3, C5-6             -patient may  shower- with dressing over cervical incision             -ELOS/Goals: 14-15 days- supervision  D/c 7/14- with goal to get home, but family wants him at [facility] I- he will NOT meet mod I  -Continue CIR therapies including PT, OT, and SLP  2.  Antithrombotics: -DVT/anticoagulation:  Mechanical: Sequential compression devices, below knee Bilateral lower extremities  6/23- should be able to start Lovenox over weekend- started 6/24, monitor neuro status and hgb 7/3 hgb stable at 9.5             -antiplatelet therapy: N/A 3. Pain Management: Oxycodone prn. Gabapentin 300 mg bid for neuropathy. Pain adequate with pain meds  7/3- pain tolerable with meds  4. Mood/Sleep: LCSW to follow for evaluation and assist. Has been sleeping well.              -antipsychotic agents:  N/A 5. Neuropsych/cognition: This patient is capable of making  decisions on his own behalf. 6. Skin/Wound Care: Monitor incision for healing.   6/29- asked nursing to change dressing  7/- staples removed 7. Fluids/Electrolytes/Nutrition: Monitor I/O. Check CMET in am.  8. Cervical myelopathy s/p decompression C2/3 and C5/6:  --Collar to be on at all times.              --Staples to come out in 2  weeks (July 1st?)  w/NS follow up  7/1- remove staples today- placed order and ordered steristrips  7/2- staples out             --dysphagia?  SLP for swallow evaluation.  9. HTN: Monitor BP TID. Continue Midamor, normodyne and amlodipine   6/26- BP elevated in 150s-160s systolic- if doesn't improve in next few days, (maxxed out on current BP meds), will check about other options, esp due to renal issues.   7/3 bp's borderline to controlled--no changes  Vitals:   08/19/21 1939 08/20/21 0519  BP: (!) 148/68 (!) 150/68  Pulse: 77 74  Resp: 17 17  Temp: 98.1 F (36.7 C) 98.6 F (37 C)  SpO2: 99% 98%    10. RA: On Arava per Dr. Cardell Peach in [place].  11. CKD 3 a:  BUN/SCr- 27/1.75 at admission-->improved since admisison --monitor with serial checks. Avoid nephrotoxic medications. 6/23- BUN 34 up from 22 and Cr 1.53- not sure exact baseline, but was 1.39 a few days ago- will give IVFs NS 75cc/hour x 24 hours and recheck in AM- think drinking impaired by swallowing issues.   6/27- will check BMP in AM  6/28- Cr 1.31 and NUN 24 down from 35- wil d/c IV  6/30- Cr 1.71- BUN up to 32- not sure if pt isn't drinking vs this his baseline? Will push fluids and recheck in AM  7/1- BUN 27 and Cr 1.55- down from 52 and 1.71- will recheck Monday  7/3 BUN improved today---continue to push fluids..     Latest Ref Rng & Units 08/20/2021    6:24 AM 08/18/2021    5:48 AM 08/16/2021    3:05 PM  BMP  Glucose 70 - 99 mg/dL 97  161  096   BUN 8 - 23 mg/dL 21  27  32   Creatinine 0.61 - 1.24 mg/dL 0.45  4.09  8.11   Sodium 135 - 145 mmol/L 137  134  133   Potassium 3.5 - 5.1 mmol/L 4.2   4.4  4.8   Chloride 98 - 111 mmol/L 103  101  97   CO2 22 - 32 mmol/L Calcium 8.9 - 10.3 mg/dL 9.1  8.9  9.5     12. Acute on chronic anemia:     7/3 stable    Latest Ref Rng & Units 08/20/2021    6:24 AM 08/13/2021    6:12 AM 08/10/2021    5:11 AM  CBC  WBC 4.0 - 10.5 K/uL 5.5  6.3  8.8   Hemoglobin 13.0 - 17.0 g/dL 9.5  9.7  9.7   Hematocrit 39.0 - 52.0 % 29.6  30.1  29.2   Platelets 150 - 400 K/uL 274  232  200     13. Urinary urgency with incontinence-    -is now continent     LOS: 11 days A FACE TO FACE EVALUATION WAS PERFORMED  Ranelle Oyster 08/20/2021, 10:37 AM

## 2021-08-20 NOTE — Progress Notes (Signed)
Occupational Therapy Session Note  Patient Details  Name: Shawn Decker MRN: [id number] Date of Birth: Apr 17, 1945  Today's Date: 08/20/2021 OT Individual Time: 7282-0601 OT Individual Time Calculation (min): 75 min    Short Term Goals: Week 2:  OT Short Term Goal 1 (Week 2): Pt will complete UB bathing and dressing with set up and CGA seated OT Short Term Goal 2 (Week 2): Pt will complete simple grooming task in standing wiht seated rests sink side after set up with min A OT Short Term Goal 3 (Week 2): Pt will complete toileting tasks with mod A (2/3 tasks) OT Short Term Goal 4 (Week 2): Pt will complete LB dressing tasks with min A  Skilled Therapeutic Interventions/Progress Updates:    Pt resting in bed upon arrival and agreeable to getting OOB to start therapy. OT intervention with focus on bed mobility, sit<>stand, funcitonal transfers, standing balance, bathing/dressing w/c level at sink, and safety awareness to increase independence with BADLs. Supine>sit EOB using bed rails and HOB elevated with supervision. LB dressing sit<>stand from EOB. CGA for pants and max A for donning shoes. Sit>stand and stand pivot tranfsers with RW at min A/CGA. Pt completed UB bahting/dressing seated at sink. Standing activities in day room to retrieve and replace cards on card board. Sit<>stand with min A and standing balance with CGA. LOB X 1 with min A to correct. Pt returned to room and remained in w/c. All needs within reach and belt alarm activated.   Therapy Documentation Precautions:  Precautions Precautions: Cervical Precaution Booklet Issued: No Precaution Comments: verbally reviewed 3/3 cervical precautions Required Braces or Orthoses: Cervical Brace Cervical Brace: Hard collar, At all times Restrictions Weight Bearing Restrictions: No  Pain: Pt reports 6/10 neck pain; RN aware  Therapy/Group: Individual Therapy  Rich Brave 08/20/2021, 9:25 AM

## 2021-08-20 NOTE — Progress Notes (Signed)
Physical Therapy Session Note  Patient Details  Name: Shawn Decker MRN: [id number] Date of Birth: [birth year]  Today's Date: 08/20/2021 PT Individual Time: 1345-1445 PT Individual Time Calculation (min): 60 min   Short Term Goals: Week 2:  PT Short Term Goal 1 (Week 2): Pt will ambulate x 100 ft with LRAD and assist x 1 consistently PT Short Term Goal 2 (Week 2): Pt will navigate 6" stairs with 2 handrails and mod A PT Short Term Goal 3 (Week 2): Pt will propel w/c x 150 ft at Supervision level  Skilled Therapeutic Interventions/Progress Updates:    Session focused on d/c planning and education in regards to home set-up/access, NMR during stair negotiation training to address functional strengthening, balance, and coordination for home entry prep, and simulated car transfer training.   Pt performs basic transfers with RW with overall CGA throughout session with occasional cues for hand placement and cues for controlled descent when stand -> sit. During stair negotiation, pt requires overall min assist with occasional cues for which foot to lead with. Noted decreased coordination/ataxia > in LLE with descending of step. Discussed that his current set up in garage does not have railings but based off of performance and overall want for increasing independence, he is going to have his son look into getting rail(s) added. Also then practiced with rail on the L only using BUE support going sideways as an option as well if only one side railing installed, and pt also same min assist level with good technique carryover noted. Pt reports fatigue between trials of 4 steps each time and requires longer rest break to catch breath.   Pt asking questions about his surgery as well as access to medical info and pt is familiar with My Chart, so made aware he can access through this also.  Simulated car transfer practice to sedan height to prepare for d/c. Short distance gait to the car with RW with overall CGA and  cues for technique. Pt able to manage BLE in and out of car independently with extra time. Required min assist for sit > stand out of car but otherwise CGA. Pt performed x 2 reps for practice.   End of session returned to room via total assist via w/c and then short distance gait with RW to recliner in room and set up with all needs in reach.   Therapy Documentation Precautions:  Precautions Precautions: Cervical Precaution Booklet Issued: No Precaution Comments: verbally reviewed 3/3 cervical precautions Required Braces or Orthoses: Cervical Brace Cervical Brace: Hard collar, At all times Restrictions Weight Bearing Restrictions: No  Pain: Reports premedicated for ongoing neck pain.     Therapy/Group: Individual Therapy  Karolee Stamps Darrol Poke, PT, DPT, CBIS  08/20/2021, 3:00 PM

## 2021-08-20 NOTE — Progress Notes (Signed)
Physical Therapy Session Note  Patient Details  Name: Shawn Decker MRN: [id number] Date of Birth: 1945-04-13  Today's Date: 08/20/2021 PT Individual Time: 9622-2979 PT Individual Time Calculation (min): 45 min   Short Term Goals: Week 2:  PT Short Term Goal 1 (Week 2): Pt will ambulate x 100 ft with LRAD and assist x 1 consistently PT Short Term Goal 2 (Week 2): Pt will navigate 6" stairs with 2 handrails and mod A PT Short Term Goal 3 (Week 2): Pt will propel w/c x 150 ft at Supervision level  Skilled Therapeutic Interventions/Progress Updates:    Pt reports 5-6 on numerical pain scale and reports he received pain medication approximately one hour ago. Pt received seated in wheelchair at bedside. Pt requires CGA for sit to stand transfer from wheelchair and with ambulation of 177 ft with RW and required one standing rest break. Pt ambulates with narrowed base of support and required verbal cues to widen base of support with gait training. Pt ambulated with RW and practiced obstacle negotiation to address balance impairments. Pt scissors and presents with a narrow base of support with turning around cones during obstacle negotiation and improves with visual and verbal cues. Pt performed 10 ft obstacle negotiation course twice. Pt then practice mini squats from low mat table. Pt performed 2 sets of 12 and required verbal cueing with hip hinge to facilitate appropriate muscle activation.  Pt then performed standing marches with RW and bilateral UE support with 10 repetitions per side. Pt reported moderate difficulty with exercises and fatigue level of 6/10 at end of session. Pt left seated in wheelchair at bedside with call bell and chair alarm on.   Therapy Documentation Precautions:  Precautions Precautions: Cervical Precaution Booklet Issued: No Precaution Comments: verbally reviewed 3/3 cervical precautions Required Braces or Orthoses: Cervical Brace Cervical Brace: Hard collar, At all  times Restrictions Weight Bearing Restrictions: No   Therapy/Group: Individual Therapy  Sheryle Hail, DPT Philip Aspen, PT, DPT, CBIS  08/20/2021, 11:58 AM

## 2021-08-21 LAB — GLUCOSE, CAPILLARY
Glucose-Capillary: 89 mg/dL (ref 70–99)
Glucose-Capillary: 111 mg/dL — ABNORMAL HIGH (ref 70–99)
Glucose-Capillary: 110 mg/dL — ABNORMAL HIGH (ref 70–99)
Glucose-Capillary: 141 mg/dL — ABNORMAL HIGH (ref 70–99)

## 2021-08-21 NOTE — Progress Notes (Addendum)
PROGRESS NOTE   Subjective/Complaints:  Pt reports pain is getting somewhat better- still requires pain meds.  Making progress daily with therapies.  LBM yesterday.   ROS:  Pt denies SOB, abd pain, CP, N/V/C/D, and vision changes   Objective:   No results found. Recent Labs    08/20/21 0624  WBC 5.5  HGB 9.5*  HCT 29.6*  PLT 274    Recent Labs    08/20/21 0624  NA 137  K 4.2  CL 103  CO2 26  GLUCOSE 97  BUN 21  CREATININE 1.51*  CALCIUM 9.1    Intake/Output Summary (Last 24 hours) at 08/21/2021 0756 Last data filed at 08/21/2021 0526 Gross per 24 hour  Intake 600 ml  Output 475 ml  Net 125 ml        Physical Exam: Vital Signs Blood pressure (!) 150/75, pulse 69, temperature 98.2 F (36.8 C), resp. rate 18, height 5\' 8"  (1.727 m), weight 77.5 kg, SpO2 98 %.          General: awake, alert, appropriate, sitting EOB with OTA, working on putting on pants; doing better; NAD HENT: conjugate gaze; oropharynx moist-cervical collar in place CV: regular rate; no JVD Pulmonary: CTA B/L; no W/R/R- good air movement GI: soft, NT, ND, (+)BS Psychiatric: appropriate Neurological: Ox3  Musculoskeletal:     Cervical back: Neck supple. No tenderness.     Comments: RUE strength deltoid 5-/5; biceps 5/5; triceps 5-/5; WE 5-/5; Grip 5/5; LUE 4/5 Delt , bi tri grip LE strength HF 4/5; KE/KF 4+/5; DF 4-/5 and PF 4+/5 B/L  B/L no effusions of B/L knees  Skin:    General: Skin is warm and dry.     Comments: Multiple healed abrasions bilateral shins?neck incision CDI  Neurological:     Alert and oriented x 3. Normal insight and awareness. Intact Memory. Normal language and speech. Cranial nerve exam unremarkable     Comments: Speech clear with good breath support but cleared throat multiple times during conversation.  Decreased to light touch in tips of fingers and toes- but not in dermatomal distribution B/L  --no change Absent DTRs in LE's B/L --no change  Assessment/Plan: 1. Functional deficits which require 3+ hours per day of interdisciplinary therapy in a comprehensive inpatient rehab setting. Physiatrist is providing close team supervision and 24 hour management of active medical problems listed below. Physiatrist and rehab team continue to assess barriers to discharge/monitor patient progress toward functional and medical goals  Care Tool:  Bathing    Body parts bathed by patient: Right arm, Left arm, Chest, Abdomen, Front perineal area, Right upper leg, Left upper leg, Face   Body parts bathed by helper: Buttocks, Right lower leg, Left lower leg     Bathing assist Assist Level: Minimal Assistance - Patient > 75%     Upper Body Dressing/Undressing Upper body dressing   What is the patient wearing?: Pull over shirt, Orthosis Orthosis activity level: Performed by helper  Upper body assist Assist Level: Minimal Assistance - Patient > 75%    Lower Body Dressing/Undressing Lower body dressing      What is the patient wearing?: Pants  Lower body assist Assist for lower body dressing: Contact Guard/Touching assist     Toileting Toileting    Toileting assist Assist for toileting: Moderate Assistance - Patient 50 - 74%     Transfers Chair/bed transfer  Transfers assist     Chair/bed transfer assist level: Contact Guard/Touching assist     Locomotion Ambulation   Ambulation assist      Assist level: Contact Guard/Touching assist Assistive device: Walker-rolling Max distance: 40'   Walk 10 feet activity   Assist  Walk 10 feet activity did not occur: Safety/medical concerns  Assist level: Contact Guard/Touching assist Assistive device: Walker-rolling   Walk 50 feet activity   Assist Walk 50 feet with 2 turns activity did not occur: Safety/medical concerns  Assist level: Contact Guard/Touching assist Assistive device: Walker-rolling    Walk 150  feet activity   Assist Walk 150 feet activity did not occur: Safety/medical concerns  Assist level: Contact Guard/Touching assist Assistive device: Walker-rolling    Walk 10 feet on uneven surface  activity   Assist Walk 10 feet on uneven surfaces activity did not occur: Safety/medical concerns         Wheelchair     Assist Is the patient using a wheelchair?: Yes Type of Wheelchair: Manual    Wheelchair assist level: Supervision/Verbal cueing Max wheelchair distance: 100'    Wheelchair 50 feet with 2 turns activity    Assist        Assist Level: Supervision/Verbal cueing   Wheelchair 150 feet activity     Assist      Assist Level: Dependent - Patient 0%   Blood pressure (!) 150/75, pulse 69, temperature 98.2 F (36.8 C), resp. rate 18, height  (1.727 m), weight 77.5 kg, SpO2 98 %.  Medical Problem List and Plan: 1. 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  Antithrombotics: -DVT/anticoagulation:  Mechanical: Sequential compression devices, below knee Bilateral lower extremities  6/23- should be able to start Lovenox over weekend- started 6/24, monitor neuro status and hgb 7/3 hgb stable at 9.5             -antiplatelet therapy: N/A 3. Pain Management: Oxycodone prn. Gabapentin 300 mg bid for neuropathy. Pain adequate with pain meds  7/4- pain tolerable with meds- con't regimen  4. Mood/Sleep: LCSW to follow for evaluation and assist. Has been sleeping well.              -antipsychotic agents:  N/A 5. Neuropsych/cognition: This patient is capable of making decisions on his own behalf. 6. Skin/Wound Care:  Monitor incision for healing.   6/29- asked nursing to change dressing  7/- staples removed 7. Fluids/Electrolytes/Nutrition: Monitor I/O. Check CMET in am.  8. Cervical myelopathy s/p decompression C2/3 and C5/6:  --Collar to be on at all times.              --Staples to come out in 2 weeks (July 1st?)  w/NS follow up  7/1- remove staples today- placed order and ordered steristrips  7/2- staples out             --  dysphagia?  SLP for swallow evaluation.  9. HTN: Monitor BP TID. Continue Midamor, normodyne and amlodipine   6/26- BP elevated in 150s-160s systolic- if doesn't improve in next few days, (maxxed out on current BP meds), will check about other options, esp due to renal issues.   7/4- BP really elevated this AM- 150s-180s systolic- this is a big spike for him; if stays up, will add BP med.   Vitals:   08/20/21 2004 08/21/21 0527  BP: (!) 180/71 (!) 150/75  Pulse: 74 69  Resp: 18 18  Temp: 98.2 F (36.8 C) 98.2 F (36.8 C)  SpO2: 100% 98%    10. RA: On [place]rav[place] per Dr. Cardell Peach in [place].  11. CKD 3 a:  BUN/SCr- 27/1.75 at admission-->improved since admisison --monitor with serial checks. Avoid nephrotoxic medications. 6/23- BUN 34 up from 22 and Cr 1.53- not sure exact baseline, but was 1.39 a few days ago- will give IVFs NS 75cc/hour x 24 hours and recheck in AM- think drinking impaired by swallowing issues.   6/27- will check BMP in AM  6/28- Cr 1.31 and NUN 24 down from 35- wil d/c IV  6/30- Cr 1.71- BUN up to 32- not sure if pt isn't drinking vs this his baseline? Will push fluids and recheck in AM  7/1- BUN 27 and Cr 1.55- down from 52 and 1.71- will recheck Monday  7/3 BUN improved today---continue to push fluids..     Latest Ref Rng & Units 08/20/2021    6:24 AM 08/18/2021    5:48 AM 08/16/2021    3:05 PM  BMP  Glucose 70 - 99 mg/dL 97  323  557   BUN 8 - 23 mg/dL 21  27  32   Creatinine 0.61 - 1.24 mg/dL 3.22  0.25  4.27   Sodium 135 - 145 mmol/L 137  134  133   Potassium  3.5 - 5.1 mmol/L 4.2  4.4  4.8   Chloride 98 - 111 mmol/L 103  101  97   CO2 22 - 32 mmol/L 26  26  26    Calcium 8.9 - 10.3 mg/dL 9.1  8.9  9.5     12. Acute on chronic anemia:     7/3 stable    Latest Ref Rng & Units 08/20/2021    6:24 AM 08/13/2021    6:12 AM 08/10/2021    5:11 AM  CBC  WBC 4.0 - 10.5 K/uL 5.5  6.3  8.8   Hemoglobin 13.0 - 17.0 g/dL 9.5  9.7  9.7   Hematocrit 39.0 - 52.0 % 29.6  30.1  29.2   Platelets 150 - 400 K/uL 274  232  200     13. Urinary urgency with incontinence-    -is now continent   I spent a total of 37   minutes on total care today- >50% coordination of care- due to team conference and d/w OT during rounds.    LOS: 12 days A FACE TO FACE EVALUATION WAS PERFORMED  Shawn Decker 08/21/2021, 7:56 AM

## 2021-08-21 NOTE — Progress Notes (Signed)
Occupational Therapy Session Note  Patient Details  Name: Shawn Decker MRN: [id number] Date of Birth: [birth year]  Today's Date: 08/21/2021 OT Individual Time: 0700-0757 OT Individual Time Calculation (min): 57 min    Short Term Goals: Week 2:  OT Short Term Goal 1 (Week 2): Pt will complete UB bathing and dressing with set up and CGA seated OT Short Term Goal 2 (Week 2): Pt will complete simple grooming task in standing wiht seated rests sink side after set up with min A OT Short Term Goal 3 (Week 2): Pt will complete toileting tasks with mod A (2/3 tasks) OT Short Term Goal 4 (Week 2): Pt will complete LB dressing tasks with min A  Skilled Therapeutic Interventions/Progress Updates:    Pt resting in bed upon arrival. OT intervention with focus on bed mobility, sit<>stand, LB dressing, standing balance, functional amb with RW, functional transfers with RW, and safety awareness to increase independence with BADLs. Supine>sit EOB with supervision using bed rails and HOB elevated. Sit<>stand from EOB with CGA. Pt threads pants using reacher and stands to pull pants over hips with CGA. Stand pivot transfer with CGA and min verbal cues for sequencing. Standing balance activities included assembling pipe tree structure, cleaning all parts, and returning to container. Pt completed tasks with CGA using BUE to accomplish. Amb with RW 20' with CGA. Pt's brief required changing and pt returned to room. Sit<>stand from w/c and pt remained standing to cleanse periarea. Brief donned by OTA with pt standing. Pt returned to w/c. All needs within reach.   Therapy Documentation Precautions:  Precautions Precautions: Cervical Precaution Booklet Issued: No Precaution Comments: verbally reviewed 3/3 cervical precautions Required Braces or Orthoses: Cervical Brace Cervical Brace: Hard collar, At all times Restrictions Weight Bearing Restrictions: No Pain:  Pt reports 5/10 neck pain; RN aware   Therapy/Group:  Individual Therapy  Rich Brave 08/21/2021, 7:58 AM

## 2021-08-21 NOTE — Progress Notes (Signed)
Received pt in bed after therapy, stable, with neck collar on.  Given Oxycodone 15 mg for neck pain as requested.

## 2021-08-21 NOTE — Progress Notes (Signed)
Physical Therapy Session Note  Patient Details  Name: Shawn Decker MRN: [id number] Date of Birth: August 30, 1945  Today's Date: 08/21/2021 PT Individual Time: 7846-9629 + 5284-1324 PT Individual Time Calculation (min): 54 min  + 73 min  Short Term Goals: Week 2:  PT Short Term Goal 1 (Week 2): Pt will ambulate x 100 ft with LRAD and assist x 1 consistently PT Short Term Goal 2 (Week 2): Pt will navigate 6" stairs with 2 handrails and mod A PT Short Term Goal 3 (Week 2): Pt will propel w/c x 150 ft at Supervision level  Skilled Therapeutic Interventions/Progress Updates:      1st session: Pt sitting in w/c to start. Reports "typical" pains in his neck but that he received pain rx prior. Pt with [place] on throughout session.Transported in w/c for time and energy conservation to main rehab gym.  Stand<>pivot transfer with RW and CGA to mat table. Cues for safety, sequencing, and hand placement during transfer.  Setup on [employer] to work on dynamic standing balance and gait training. Harness donned/doffed in sitting for safety: -forward/backward walking with HHA and minA. Moderate knee buckling observed on L>R while walking backwards, requiring RW for safety to return to mat table. -alternating foot taps to cone with HHA, 2x10 bilaterally. Difficutly with knee control in stance leg and coordination of other. -ball toss to challenge functional reaching and ankle/hip strategies. Pt quite fearful of falling and limited ability to reach outside BOS in all directions.  Returned to room in w/c for time as pt requesting to toilet prior to ending therapy session. Ambulatory transfer within room with CGA and RW to [facility] over toilet. Required assist for LB dressing in standing. Pt continent of bladder (charted). Ambulatory transfer back to w/c with CGA and RW. Cues for safety approach to sitting. All needs met, left sitting in w/c with call bell in lap.    2nd session: Pt sitting in w/c to start. Pt with [place] on  throughout session. Requesting to use bathroom prior to leaving his room. Sit<>Stand to RW with CGA with good understanding of hand placement. Ambulated to bathroom, ~29f, with CGA and RW. Assist for LB dressing in standing and pt continent of bladder while sitting on toilet. Ambulated back to w/c with CGA and RW with cues for safety awareness while managing threshold at doorway. Transported in w/c to main rehab gym for energy conservation.   Stair training using 6inch steps and 2 hand rails. Completed with CGA overall with step-to pattern while forward facing. Pt able to recall "up with the good, down with the bad" without cues. Completed x12 steps + x12 steps with seated rest break b/w sets.   TUG completed x3 trials using RW and CGA: 1) 30s 2) 28.5s 3) 23.5s AVG = 27.3s *Scores >13.5 seconds indicate increased falls risk.   Curb training completed using 4inch curb and RW. Requires light minA for navigating up/down the curb and for stabilizing RW while managing curb. Demonstration provided for understanding of sequencing and pt with good carryover. Completed x4 times.  SciFit UE ergometer completed in standing to challenge stamina and endurance - CGA for safety with L3 resistance. Completed 3.5 + 3.5 minutes with seated rest breaks b/w efforts.  Returned to room in w/c and pt requesting to lie down due to fatigue. Ambulatory transfer with CGA and RW to bed with verbal cueing needed for sequencing safety approach and RW management in tight spaces.   Bed mobility completed with CGA with HOB  slightly elevated. Remained semi-reclined in bed with bed alarm on, call bell in reach, all needs met.   Therapy Documentation Precautions:  Precautions Precautions: Cervical Precaution Booklet Issued: No Precaution Comments: verbally reviewed 3/3 cervical precautions Required Braces or Orthoses: Cervical Brace Cervical Brace: Hard collar, At all times Restrictions Weight Bearing Restrictions:  No General:    Therapy/Group: Individual Therapy  Alger Simons 08/21/2021, 7:33 AM

## 2021-08-21 NOTE — Patient Care Conference (Signed)
Inpatient RehabilitationTeam Conference and Plan of Care Update Date: 08/21/2021   Time: 11:00 AM    Patient Name: Shawn Decker      Medical Record Number: [id number]  Date of Birth: 1945-04-23 Sex: Male         Room/Bed: 4W05C/4W05C-01 Payor Info: Payor: [employer] MEDICARE / Plan: [employer] MEDICARE HMO/PPO / Product Type: *No Product type* /    Admit Date/Time:  08/09/2021  2:55 PM  Primary Diagnosis:  Cervical myelopathy [facility])  Hospital Problems: Principal Problem:   Cervical myelopathy [facility]) Active Problems:   Dysphagia    Expected Discharge Date: Expected Discharge Date: 08/31/21  Team Members Present: Physician leading conference: Dr. Genice Rouge Social Worker Present: Dossie Der, LCSW Nurse Present: Kennyth Arnold, RN PT Present: Bernie Covey, PT OT Present: Ardis Rowan, Carlis Abbott, OT SLP Present: Sarita Bottom, SLP     Current Status/Progress Goal Weekly Team Focus  Bowel/Bladder   using condom cath at [facility].  LBM 7/2 Taking Senna daily & colace BID.  increase urinary continence at HS  timed toileting   Swallow/Nutrition/ Hydration             ADL's   bathing-min A; UB dressing-min A: LB dressing-min A; stand pivot transfers-min A; toileting-mod A  supervsion/CGA overall  BADL retraining, functional transfers, safety awareness, activity tolerance   Mobility   CGA transfers, ambulation, and balance training. Mod A stairs.  Supervision with transfers, Min A for car transfers, gait and stairs  Continuing to work on family education, transfers, gait training, stairs and discharge planning.   Communication             Safety/Cognition/ Behavioral Observations            Pain   reports 7/10 neck pain  < 3  assess pain q 4 hr and prn   Skin   Staples removed from posterior neck  incision  ;No new breakdown. Incision remains free form infection  Assess skin every shift and PRN.     Discharge Planning:  Discharging home with son-Shawn Decker family education set up for  Wed 7/5, see how goes and set up home health and any DME   Team Discussion: Pain improving, BP labile, on max dose of 3 agents. Hesitant to start a 4th. Continent B/B, condom cath at [facility], reports 7/10 neck pain. Staples discontinued from neck incision. Family education tomorrow.   Patient on target to meet rehab goals: yes, supervision to CGA goals. Currently min assist transfers, STS. CGA ambulation, mod assist stairs. SLP discharged patient. Memory notebook started. Mild dementia.  *See Care Plan and progress notes for long and short-term goals.   Revisions to Treatment Plan:  Adjusting medications.   Teaching Needs: Family education, medication/pain management, skin/wound care, transfer/gait training, etc.   Current Barriers to Discharge: Decreased caregiver support, Home enviroment access/layout, Wound care, Lack of/limited family support, and Weight bearing restrictions  Possible Resolutions to Barriers: Family education Order recommended DME     Medical Summary Current Status: BUN and Cr better when push fluids- 7/10 neck pain- still taking meds-condom cath at night. BP still a little labile  Barriers to Discharge: Decreased family/caregiver support;Home enviroment access/layout;Incontinence;Medical stability;Weight bearing restrictions;Wound care  Barriers to Discharge Comments: family  ed tomorrow- has memory notebook- shouldn't be alone- extending will not improve cognition- SLUMS is decreased/borderline Possible Resolutions to [employer] Focus: min A with OT- making good progress-staples out of neck-CGA30min A with PT- goals family ed- stairs a big barrier as well- d/c  7/14   Continued Need for Acute Rehabilitation Level of Care: The patient requires daily medical management by a physician with specialized training in physical medicine and rehabilitation for the following reasons: Direction of a multidisciplinary physical rehabilitation program to maximize functional  independence : Yes Medical management of patient stability for increased activity during participation in an intensive rehabilitation regime.: Yes Analysis of laboratory values and/or radiology reports with any subsequent need for medication adjustment and/or medical intervention. : Yes   I attest that I was present, lead the team conference, and concur with the assessment and plan of the team.   Tennis Must 08/21/2021, 2:39 PM

## 2021-08-21 NOTE — Progress Notes (Signed)
Patient ID: Shawn Decker, male   DOB: [birth year], 76 y.o.   MRN: [id number]  Met with pt and his grandson who is present in his room to update them regarding team conference progress toward goals of supervision-CGA and target discharge date of 7/14. Son to be here tomorrow for family education and will see while here to address concerns. Pt and grandson are aware of team's recommendations of 24/7 care at discharge due to cognitive issues and for safety.

## 2021-08-22 LAB — GLUCOSE, CAPILLARY
Glucose-Capillary: 96 mg/dL (ref 70–99)
Glucose-Capillary: 103 mg/dL — ABNORMAL HIGH (ref 70–99)
Glucose-Capillary: 91 mg/dL (ref 70–99)
Glucose-Capillary: 121 mg/dL — ABNORMAL HIGH (ref 70–99)

## 2021-08-22 NOTE — Progress Notes (Signed)
Received pt sitting in chair with neck collar on, stable, family at bedside, supportive.

## 2021-08-22 NOTE — Progress Notes (Signed)
Occupational Therapy Session Note  Patient Details  Name: Rayansh Herbst MRN: [id number] Date of Birth: [birth year]  Today's Date: 08/22/2021 OT Individual Time: 1300-1358 OT Individual Time Calculation (min): 58 min    Short Term Goals: Week 2:  OT Short Term Goal 1 (Week 2): Pt will complete UB bathing and dressing with set up and CGA seated OT Short Term Goal 2 (Week 2): Pt will complete simple grooming task in standing wiht seated rests sink side after set up with min A OT Short Term Goal 3 (Week 2): Pt will complete toileting tasks with mod A (2/3 tasks) OT Short Term Goal 4 (Week 2): Pt will complete LB dressing tasks with min A  Skilled Therapeutic Interventions/Progress Updates:    Pt resting in w/c upon arrival. Son present for education. Demonstrated and son return demonstrated removal and replacing cervical collar pads, in addition to care for pads. Pt demostrated bed mobility on flat bed without bed rails at supervision level. Sit<>stand with supervision. Walk-in shower transfers with min A. Discussed equipment needs. Son to order shower seat. Pt will decide if he needs a BSC (may already have one.) discussed home safety and current assist levels. Pt remained seated in w/c with all needs within reach. Son present awaiting PT.   Therapy Documentation Precautions:  Precautions Precautions: Cervical Precaution Booklet Issued: No Precaution Comments: verbally reviewed 3/3 cervical precautions Required Braces or Orthoses: Cervical Brace Cervical Brace: Hard collar, At all times Restrictions Weight Bearing Restrictions: No Pain:  Pt c/o 3/10 neck pain; repositioned    Therapy/Group: Individual Therapy  Rich Brave 08/22/2021, 2:05 PM

## 2021-08-22 NOTE — Progress Notes (Signed)
Physical Therapy Session Note  Patient Details  Name: Shawn Decker MRN: [id number] Date of Birth: February 08, 1946  Today's Date: 08/22/2021 PT Individual Time: 1435-1530 PT Individual Time Calculation (min): 55 min   Short Term Goals: Week 2:  PT Short Term Goal 1 (Week 2): Pt will ambulate x 100 ft with LRAD and assist x 1 consistently PT Short Term Goal 2 (Week 2): Pt will navigate 6" stairs with 2 handrails and mod A PT Short Term Goal 3 (Week 2): Pt will propel w/c x 150 ft at Supervision level  Skilled Therapeutic Interventions/Progress Updates: Pt presented in w/c with son present agreeable to therapy. Pt states pain 4/10 at incision site, pt premedicated and rest breaks provided as needed for pain management. Session focused on hands on functional mobility with son. Pt transported to ortho gym and pt was able to perform an ambulatory transfer with RW and CGA to car simulator as well as performed car transfer with CGA. Pt was able to get out of car with CGA and ambulate with CGA up/down ramp. PTA provided cues for slower controlled movement with descending ramp. Pt then transported to rehab gym and pt was able to ascend/descend x 8 steps with L rail only as son unsure if rail in garage will be successfully completed this weekend. Pt was able to perform x 8 steps with light minA and verbal cues for foot placement when descending. Pt's son was then able to also ascend/descend x 8 steps with pt providing appropriate guarding and verbal cues. Pt also participated in obstacle course including stepping over thresholds weaving through cones, and ambulating on compliant surface. Pt initially required CGA then required minA when returning to w/c due to poor foot placement. Pt transported back to room at end of session and remained in w/c. Pt handed off to RN for continued education with resting comfortably.      Therapy Documentation Precautions:  Precautions Precautions: Cervical Precaution Booklet  Issued: No Precaution Comments: verbally reviewed 3/3 cervical precautions Required Braces or Orthoses: Cervical Brace Cervical Brace: Hard collar, At all times Restrictions Weight Bearing Restrictions: No General:   Vital Signs: Therapy Vitals Temp: 98 F (36.7 C) Pulse Rate: 81 Resp: 18 BP: (!) 146/65 Patient Position (if appropriate): Sitting Oxygen Therapy SpO2: 100 % O2 Device: Room Air Pain:   Mobility:   Locomotion :    Trunk/Postural Assessment :    Balance:   Exercises:   Other Treatments:      Therapy/Group: Individual Therapy  Tehran Rabenold 08/22/2021, 4:20 PM

## 2021-08-22 NOTE — Progress Notes (Signed)
Occupational Therapy Session Note  Patient Details  Name: Shawn Decker MRN: [id number] Date of Birth: [birth year]  Today's Date: 08/22/2021 OT Individual Time: 1062-6948 OT Individual Time Calculation (min): 74 min    Short Term Goals: Week 2:  OT Short Term Goal 1 (Week 2): Pt will complete UB bathing and dressing with set up and CGA seated OT Short Term Goal 2 (Week 2): Pt will complete simple grooming task in standing wiht seated rests sink side after set up with min A OT Short Term Goal 3 (Week 2): Pt will complete toileting tasks with mod A (2/3 tasks) OT Short Term Goal 4 (Week 2): Pt will complete LB dressing tasks with min A  Skilled Therapeutic Interventions/Progress Updates:    Pt resting in bed upon arrival and ready for therapy. OT intervention with focus on bed mobility, sit<>stand, standing balance, LB dressing, toilet tranfsers, toileting, and safety awareness to increase independence with BADLs. Supine>sit EOB with supervision using bed rails. Pt threaded BLE into pants with supervision using reacher. Sit<>stand with CGA. CGA for standing balance when pt pulled pants over hips. Pt requires assistance donning and fastening sneakers. Pt transitioned to day room for standing activities. Sit<.stand from w/c with CGA. Pt requested to use toilet and returned to room. Pt amb with RW into bathroom with CGA. Toileting with mod A. Pt amb with RW back into room and stood at sink to wash hands before returning to w/c. Pt remained in w/c with all needs within reach.   Therapy Documentation Precautions:  Precautions Precautions: Cervical Precaution Booklet Issued: No Precaution Comments: verbally reviewed 3/3 cervical precautions Required Braces or Orthoses: Cervical Brace Cervical Brace: Hard collar, At all times Restrictions Weight Bearing Restrictions: No Pain:  Pt reports 4/10 neck pain; MD aware   Therapy/Group: Individual Therapy  Rich Brave 08/22/2021, 8:17 AM

## 2021-08-22 NOTE — Progress Notes (Signed)
Occupational Therapy Session Note  Patient Details  Name: Shawn Decker MRN: [id number] Date of Birth: [birth year]  Today's Date: 08/22/2021 OT Individual Time: 6195-0932 OT Individual Time Calculation (min): 30 min    Short Term Goals: Week 2:  OT Short Term Goal 1 (Week 2): Pt will complete UB bathing and dressing with set up and CGA seated OT Short Term Goal 2 (Week 2): Pt will complete simple grooming task in standing wiht seated rests sink side after set up with min A OT Short Term Goal 3 (Week 2): Pt will complete toileting tasks with mod A (2/3 tasks) OT Short Term Goal 4 (Week 2): Pt will complete LB dressing tasks with min A  Skilled Therapeutic Interventions/Progress Updates:    OT intervention with focus on standing balance to increase independence with BADLs. Pt engaged in standing activities at [facility] including visual scanning, sequencing, and tracing. Pt completed all activities with CGA and using BUE to complete activities. Pt required unilateral UE support when standing. Pt stood at monitor and sprayed cleaning fluid and wiped the monitor clean-all with CGA. Pt returned to room and remained in w/c with all needs within reach.   Therapy Documentation Precautions:  Precautions Precautions: Cervical Precaution Booklet Issued: No Precaution Comments: verbally reviewed 3/3 cervical precautions Required Braces or Orthoses: Cervical Brace Cervical Brace: Hard collar, At all times Restrictions Weight Bearing Restrictions: No  Pain:  Pt reports pain in neck has improved since earlier session this morning   Therapy/Group: Individual Therapy  Rich Brave 08/22/2021, 9:20 AM

## 2021-08-22 NOTE — Progress Notes (Signed)
PROGRESS NOTE   Subjective/Complaints:  Pt reports LBM yesterday- feels like needs to go again.  Pain 4-5/10 right now- so is improving.  Had some L hip/low back pain- that was muscular- resolved spontaneously yesterday.     ROS:   Pt denies SOB, abd pain, CP, N/V/C/D, and vision changes  Objective:   No results found. Recent Labs    08/20/21 0624  WBC 5.5  HGB 9.5*  HCT 29.6*  PLT 274    Recent Labs    08/20/21 0624  NA 137  K 4.2  CL 103  CO2 26  GLUCOSE 97  BUN 21  CREATININE 1.51*  CALCIUM 9.1    Intake/Output Summary (Last 24 hours) at 08/22/2021 1108 Last data filed at 08/22/2021 0747 Gross per 24 hour  Intake 920 ml  Output 1100 ml  Net -180 ml        Physical Exam: Vital Signs Blood pressure (!) 145/71, pulse 71, temperature 98.5 F (36.9 C), temperature source Oral, resp. rate 17, height 5\' 8"  (1.727 m), weight 77.5 kg, SpO2 99 %.           General: awake, alert, appropriate, at sink doing grooming; OTA in room; NAD HENT: conjugate gaze; oropharynx moist- incision almost completely healed- staples out- cervical collar in place CV: regular rate; no JVD Pulmonary: CTA B/L; no W/R/R- good air movement GI: soft, NT, ND, (+)BS Psychiatric: appropriate Neurological: alert- using memory book Musculoskeletal:     Cervical back: Neck supple. No tenderness.     Comments: RUE strength deltoid 5-/5; biceps 5/5; triceps 5-/5; WE 5-/5; Grip 5/5; LUE 4/5 Delt , bi tri grip LE strength HF 4/5; KE/KF 4+/5; DF 4-/5 and PF 4+/5 B/L  B/L no effusions of B/L knees  Skin:    General: Skin is warm and dry.     Comments: Multiple healed abrasions bilateral shins?neck incision CDI  Neurological:     Alert and oriented x 3. Normal insight and awareness. Intact Memory. Normal language and speech. Cranial nerve exam unremarkable     Comments: Speech clear with good breath support but cleared throat  multiple times during conversation.  Decreased to light touch in tips of fingers and toes- but not in dermatomal distribution B/L --no change Absent DTRs in LE's B/L --no change  Assessment/Plan: 1. Functional deficits which require 3+ hours per day of interdisciplinary therapy in a comprehensive inpatient rehab setting. Physiatrist is providing close team supervision and 24 hour management of active medical problems listed below. Physiatrist and rehab team continue to assess barriers to discharge/monitor patient progress toward functional and medical goals  Care Tool:  Bathing    Body parts bathed by patient: Right arm, Left arm, Chest, Abdomen, Front perineal area, Right upper leg, Left upper leg, Face   Body parts bathed by helper: Buttocks, Right lower leg, Left lower leg     Bathing assist Assist Level: Minimal Assistance - Patient > 75%     Upper Body Dressing/Undressing Upper body dressing   What is the patient wearing?: Pull over shirt, Orthosis Orthosis activity level: Performed by helper  Upper body assist Assist Level: Minimal Assistance - Patient > 75%  Lower Body Dressing/Undressing Lower body dressing      What is the patient wearing?: Pants     Lower body assist Assist for lower body dressing: Contact Guard/Touching assist     Toileting Toileting    Toileting assist Assist for toileting: Moderate Assistance - Patient 50 - 74%     Transfers Chair/bed transfer  Transfers assist     Chair/bed transfer assist level: Contact Guard/Touching assist     Locomotion Ambulation   Ambulation assist      Assist level: Contact Guard/Touching assist Assistive device: Walker-rolling Max distance: 40'   Walk 10 feet activity   Assist  Walk 10 feet activity did not occur: Safety/medical concerns  Assist level: Contact Guard/Touching assist Assistive device: Walker-rolling   Walk 50 feet activity   Assist Walk 50 feet with 2 turns activity did  not occur: Safety/medical concerns  Assist level: Contact Guard/Touching assist Assistive device: Walker-rolling    Walk 150 feet activity   Assist Walk 150 feet activity did not occur: Safety/medical concerns  Assist level: Contact Guard/Touching assist Assistive device: Walker-rolling    Walk 10 feet on uneven surface  activity   Assist Walk 10 feet on uneven surfaces activity did not occur: Safety/medical concerns         Wheelchair     Assist Is the patient using a wheelchair?: Yes Type of Wheelchair: Manual    Wheelchair assist level: Supervision/Verbal cueing Max wheelchair distance: 100'    Wheelchair 50 feet with 2 turns activity    Assist        Assist Level: Supervision/Verbal cueing   Wheelchair 150 feet activity     Assist      Assist Level: Dependent - Patient 0%   Blood pressure (!) 145/71, pulse 71, temperature 98.5 F (36.9 C), temperature source Oral, resp. rate 17, height 5\' 8"  (1.727 m), weight 77.5 kg, SpO2 99 %.  Medical Problem List and Plan: 1. 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  Antithrombotics: -DVT/anticoagulation:  Mechanical: Sequential compression devices, below knee Bilateral lower extremities  6/23- should be able to start Lovenox over weekend- started 6/24, monitor neuro status and hgb 7/3 hgb stable at 9.5             -antiplatelet therapy: N/A 3. Pain Management: Oxycodone prn. Gabapentin 300 mg bid for neuropathy. Pain adequate with pain meds  7/5- pain tolerable and improving- con't regimen  4. Mood/Sleep: LCSW to follow for evaluation and assist. Has been sleeping well.               -antipsychotic agents:  N/A 5. Neuropsych/cognition: This patient is capable of making decisions on his own behalf. 6. Skin/Wound Care: Monitor incision for healing.   6/29- asked nursing to change dressing  7/5- incision looks great- staples out 7/1 7. Fluids/Electrolytes/Nutrition: Monitor I/O. Check CMET in am.  8. Cervical myelopathy s/p decompression C2/3 and C5/6:  --Collar to be on at all times.              --Staples to  come out in 2 weeks (July 1st?)  w/NS follow up  7/1- remove staples today- placed order and ordered steristrips  7/2- staples out             --dysphagia?  SLP for swallow evaluation.  9. HTN: Monitor BP TID. Continue Midamor, normodyne and amlodipine   6/26- BP elevated in 150s-160s systolic- if doesn't improve in next few days, (maxxed out on current BP meds), will check about other options, esp due to renal issues.   7/4- BP really elevated this AM- 150s-180s systolic- this is a big spike for him; if stays up, will add BP med. 7/5- BP MUCH better this AM- monitor trend   Vitals:   08/21/21 1925 08/22/21 0430  BP: 126/70 (!) 145/71  Pulse: 79 71  Resp: 16 17  Temp: 98 F (36.7 C) 98.5 F (36.9 C)  SpO2: 98% 99%    10. RA: On [facility]rav[facility] per Dr. Cardell Peach in [place].  11. CKD 3 a:  BUN/SCr- 27/1.75 at admission-->improved since admisison --monitor with serial checks. Avoid nephrotoxic medications. 6/23- BUN 34 up from 22 and Cr 1.53- not sure exact baseline, but was 1.39 a few days ago- will give IVFs NS 75cc/hour x 24 hours and recheck in AM- think drinking impaired by swallowing issues.   6/27- will check BMP in AM  6/28- Cr 1.31 and NUN 24 down from 35- wil d/c IV  6/30- Cr 1.71- BUN up to 32- not sure if pt isn't drinking vs this his baseline? Will push fluids and recheck in AM  7/1- BUN 27 and Cr 1.55- down from 52 and 1.71- will recheck Monday  7/3 BUN improved today---continue to push fluids..     Latest Ref Rng & Units 08/20/2021    6:24 AM 08/18/2021    5:48 AM  08/16/2021    3:05 PM  BMP  Glucose 70 - 99 mg/dL 97  884  166   BUN 8 - 23 mg/dL 21  27  32   Creatinine 0.61 - 1.24 mg/dL 0.63  0.16  0.10   Sodium 135 - 145 mmol/L 137  134  133   Potassium 3.5 - 5.1 mmol/L 4.2  4.4  4.8   Chloride 98 - 111 mmol/L 103  101  97   CO2 22 - 32 mmol/L 26  26  26    Calcium 8.9 - 10.3 mg/dL 9.1  8.9  9.5     12. Acute on chronic anemia:     7/3 stable    Latest Ref Rng & Units 08/20/2021    6:24 AM 08/13/2021    6:12 AM 08/10/2021    5:11 AM  CBC  WBC 4.0 - 10.5 K/uL 5.5  6.3  8.8   Hemoglobin 13.0 - 17.0 g/dL 9.5  9.7  9.7   Hematocrit 39.0 - 52.0 % 29.6  30.1  29.2   Platelets 150 - 400 K/uL 274  232  200     13. Urinary urgency with incontinence-    -is now continent    LOS: 13 days A FACE TO FACE EVALUATION WAS PERFORMED  Stevi Hollinshead 08/22/2021, 11:08 AM

## 2021-08-23 LAB — GLUCOSE, CAPILLARY
Glucose-Capillary: 89 mg/dL (ref 70–99)
Glucose-Capillary: 104 mg/dL — ABNORMAL HIGH (ref 70–99)
Glucose-Capillary: 146 mg/dL — ABNORMAL HIGH (ref 70–99)
Glucose-Capillary: 126 mg/dL — ABNORMAL HIGH (ref 70–99)

## 2021-08-23 NOTE — Progress Notes (Signed)
Physical Therapy Weekly Progress Note  Patient Details  Name: Shawn Decker MRN: [id number] Date of Birth: April 30, 1945  Beginning of progress report period: August 16, 2021 End of progress report period: August 23, 2021  Today's Date: 08/23/2021  Patient has met 3 of 3 short term goals.  Pt continues to make steady gains during current course of therapy. Pt currently is CGA with occasional minA for transfers has progressed ambulation >167f with CGA however continues to demonstrate some BLE weakness and decreased endurance. Pt has been able to demonstrate stairs with minA. Family education with son has been initiated with son demonstrating good understanding of pt's deficits. Due to pt's progression, anticipate goals to be upgraded as noted below.   Patient continues to demonstrate the following deficits muscle weakness and impaired timing and sequencing and unbalanced muscle activation and therefore will continue to benefit from skilled PT intervention to increase functional independence with mobility.  Patient progressing toward long term goals..  Plan of care revisions: upgraded goals to Supervision level overall for transfers and gait and CGA for stairs due progress.  PT Short Term Goals Week 2:  PT Short Term Goal 1 (Week 2): Pt will ambulate x 100 ft with LRAD and assist x 1 consistently PT Short Term Goal 1 - Progress (Week 2): Met PT Short Term Goal 2 (Week 2): Pt will navigate 6" stairs with 2 handrails and mod A PT Short Term Goal 2 - Progress (Week 2): Met PT Short Term Goal 3 (Week 2): Pt will propel w/c x 150 ft at Supervision level PT Short Term Goal 3 - Progress (Week 2): Met Week 3:  PT Short Term Goal 1 (Week 3): =LTG due to ELOS  Therapy Documentation Precautions:  Precautions Precautions: Cervical Precaution Booklet Issued: No Precaution Comments: verbally reviewed 3/3 cervical precautions Required Braces or Orthoses: Cervical Brace Cervical Brace: Hard collar, At all  times Restrictions Weight Bearing Restrictions: No    Therapy/Group: Individual Therapy  Rosita DeChalus TExcell Seltzer PT, DPT, CSRS  08/23/2021, 4:10 PM

## 2021-08-23 NOTE — Plan of Care (Signed)
  Problem: RH Car Transfers Goal: LTG Patient will perform car transfers with assist (PT) Description: LTG: Patient will perform car transfers with assistance (PT). Flowsheets (Taken 08/23/2021 0748) LTG: Pt will perform car transfers with assist:: (upgrade due to progress) Supervision/Verbal cueing Note: Upgrade due to progress   Problem: RH Ambulation Goal: LTG Patient will ambulate in controlled environment (PT) Description: LTG: Patient will ambulate in a controlled environment, # of feet with assistance (PT). Flowsheets Taken 08/23/2021 0748 by Peter Congo, PT LTG: Pt will ambulate in controlled environ  assist needed:: (upgrade due to progress) Supervision/Verbal cueing Taken 08/10/2021 1254 by Bernie Covey C, PT LTG: Ambulation distance in controlled environment: 150 ft Note: Upgrade due to progress Goal: LTG Patient will ambulate in home environment (PT) Description: LTG: Patient will ambulate in home environment, # of feet with assistance (PT). Flowsheets Taken 08/23/2021 0748 by Peter Congo, PT LTG: Pt will ambulate in home environ  assist needed:: (upgrade due to progress) Supervision/Verbal cueing Taken 08/10/2021 1254 by Bernie Covey C, PT LTG: Ambulation distance in home environment: 50 Note: Upgrade due to progress   Problem: RH Stairs Goal: LTG Patient will ambulate up and down stairs w/assist (PT) Description: LTG: Patient will ambulate up and down # of stairs with assistance (PT) Flowsheets Taken 08/23/2021 0748 by Peter Congo, PT LTG: Pt will ambulate up/down stairs assist needed:: (upgrade due to progress) Contact Guard/Touching assist Taken 08/10/2021 1254 by Bernie Covey C, PT LTG: Pt will  ambulate up and down number of stairs: 3-4 per home environment Note: Upgrade due to progress

## 2021-08-23 NOTE — Progress Notes (Signed)
Occupational Therapy Weekly Progress Note  Patient Details  Name: Shawn Decker MRN: [id number] Date of Birth: 03-02-1945  Beginning of progress report period: August 16, 2021 End of progress report period: August 23, 2021  Patient has met 4 of 4 short term goals.  Pt is making steady progress towards LTGs of supervision. Pt performs functional tranfsers using RW at CGA/min A level. Pt requires assistance managing Aspen collar but completes all UB bathing/dressing tasks with supervision. Pt requires min A for LB dressing tasks with sit<>stand. Pt currently requires mod A for toileting tasks. Pt's son has been present and participated in therapy sessions.  Patient continues to demonstrate the following deficits: muscle weakness, decreased cardiorespiratoy endurance, impaired timing and sequencing, unbalanced muscle activation, and decreased coordination, and decreased sitting balance, decreased standing balance, decreased postural control, and decreased balance strategies and therefore will continue to benefit from skilled OT intervention to enhance overall performance with BADL and Reduce care partner burden.  Patient progressing toward long term goals..  Continue plan of care.  OT Short Term Goals Week 2:  OT Short Term Goal 1 (Week 2): Pt will complete UB bathing and dressing with set up and CGA seated OT Short Term Goal 1 - Progress (Week 2): Met OT Short Term Goal 2 (Week 2): Pt will complete simple grooming task in standing wiht seated rests sink side after set up with min A OT Short Term Goal 2 - Progress (Week 2): Met OT Short Term Goal 3 (Week 2): Pt will complete toileting tasks with mod A (2/3 tasks) OT Short Term Goal 3 - Progress (Week 2): Met OT Short Term Goal 4 (Week 2): Pt will complete LB dressing tasks with min A OT Short Term Goal 4 - Progress (Week 2): Met Week 3:  OT Short Term Goal 1 (Week 3): STG=LTG 2/2 ELOS (continue working towards supervision goals)   Leroy Libman 08/23/2021, 6:23 AM

## 2021-08-23 NOTE — Progress Notes (Signed)
Occupational Therapy Session Note  Patient Details  Name: Shawn Decker MRN: [id number] Date of Birth: 08-Sep-1945  Today's Date: 08/23/2021 OT Individual Time: 8295-6213 OT Individual Time Calculation (min): 73 min    Short Term Goals: Week 3:  OT Short Term Goal 1 (Week 3): STG=LTG 2/2 ELOS (continue working towards supervision goals)  Skilled Therapeutic Interventions/Progress Updates:    OT intervention with focus on bed mobility, dressing, sit<>stand, standing balance, functional amb with RW, toilet tranfsers, and safety awareness to increase independence with BADLs. Supine>sit EOB with supervision. Sit<>stand with supervision. Functional transfers and functional amb with RW with CGA. Standing activities tossing ball land 1kg ball agains rebounder 3x20 with CGA. No LOB noted. Pt amb 40'x2 with CGA. Pt returned to room and requested to use toilet. Amb with RW into bathroom with CGA. Pt remained on toilet. Pt verbalized understanding of instructions to use pull cord in bathroom to notify nursing when finished.   Therapy Documentation Precautions:  Precautions Precautions: Cervical Precaution Booklet Issued: No Precaution Comments: verbally reviewed 3/3 cervical precautions Required Braces or Orthoses: Cervical Brace Cervical Brace: Hard collar, At all times Restrictions Weight Bearing Restrictions: No   Pain:  Pt reports 5/10 neck pain; MD aware   Therapy/Group: Individual Therapy  Rich Brave 08/23/2021, 8:16 AM

## 2021-08-23 NOTE — Progress Notes (Signed)
Physical Therapy Session Note  Patient Details  Name: Shawn Decker MRN: [id number] Date of Birth: 1945-06-07  Today's Date: 08/23/2021 PT Individual Time: 1010-1105 and 6010-9323 PT Individual Time Calculation (min): 55 min and 72 min  Short Term Goals: Week 2:  PT Short Term Goal 1 (Week 2): Pt will ambulate x 100 ft with LRAD and assist x 1 consistently PT Short Term Goal 1 - Progress (Week 2): Met PT Short Term Goal 2 (Week 2): Pt will navigate 6" stairs with 2 handrails and mod A PT Short Term Goal 2 - Progress (Week 2): Met PT Short Term Goal 3 (Week 2): Pt will propel w/c x 150 ft at Supervision level PT Short Term Goal 3 - Progress (Week 2): Met  Skilled Therapeutic Interventions/Progress Updates: Tx1: Pt presented in w/c agreeable to therapy. Pt states some decreased pain at incision site. Pt requesting to use bathroom prior to leaving room. W/c moved to bathroom and pt used wall rail to transfer to toilet with CGA. Pt was CGA with clothing management to lower pants but required minA for brief management to pull back up over hips (+ urinary void). Pt then transported to day room and performed ambulatory transfer to high/low mat. Pt performed Sit to stand 2 x 5 without AD for BLE strengthening. With countertop support pt performed additional BLE therex including hip abd/add, hamstring curls, high marches, and mini squats 2 x 10. Pt then ambulated 194f towards room around nsg station with RW and CGA. Pt noted to have improved B knee control during ambulation this session until fatigued then some B knee instability noted L>R. Pt propelled remaining distance back to room and remained in w/c at end of session with belt alarm on, call bell within reach and current needs met.   Tx2: Pt presented in w/c agreeable to therapy. Pt states pain at incision area 4/10, premedicated. Rest breaks provided as needed during session. Pt propelled to rehab gym with supervision for general conditioning. Performed  stand pivot transfer to high/low mat with RW and CGA. Pt participated in dynamic balance and gait activities with Maxi Sky harness for safety. Pt participated in toe taps to target dots without AD with min to moderate challenges. Pt initally requiring minA but with verbal cues and increased weight shifting noted pt was able to progress to CGA.  Pt required increased assistance when stepping out with RLE due to LLE/hip instability however some improvement with later bouts. Pt also ambulated with HHA using agility ladder as guideline to increase step length and foot clearance requiring modA. Pt also performed side stepping using HHA requiring modA. With fatigue pt noted to have increased R knee flexion and increased posterior lean with near LOB x 1 with PTA providing modA and verbal cues to regain balance. Pt did reqire frequent rest break beween bouts due to fatigue. Performed ambulatory transfer to w/c with RW and CGA. Pt was able to propel back to room supervision with x 1 rest break. Pt left in w/c at end of session with call bell within reach and current needs met.      Therapy Documentation Precautions:  Precautions Precautions: Cervical Precaution Booklet Issued: No Precaution Comments: verbally reviewed 3/3 cervical precautions Required Braces or Orthoses: Cervical Brace Cervical Brace: Hard collar, At all times Restrictions Weight Bearing Restrictions: No General:   Vital Signs:   Pain: Pain Assessment Pain Score: 0-No pain Mobility:   Locomotion :    Trunk/Postural Assessment :    Balance:  Exercises:   Other Treatments:      Therapy/Group: Individual Therapy  Shawn Decker 08/23/2021, 12:46 PM

## 2021-08-23 NOTE — Progress Notes (Signed)
PROGRESS NOTE   Subjective/Complaints:  Pt reports pain is still down ~ 5/10 from ~ 7/10 or higher when first got here.   Joking about wanting to go home.  But knows he's here until 7/14.   ROS:   Pt denies SOB, abd pain, CP, N/V/C/D, and vision changes  Objective:   No results found. No results for input(s): "WBC", "HGB", "HCT", "PLT" in the last 72 hours.   No results for input(s): "NA", "K", "CL", "CO2", "GLUCOSE", "BUN", "CREATININE", "CALCIUM" in the last 72 hours.   Intake/Output Summary (Last 24 hours) at 08/23/2021 0809 Last data filed at 08/22/2021 1300 Gross per 24 hour  Intake 240 ml  Output --  Net 240 ml        Physical Exam: Vital Signs Blood pressure 134/67, pulse 70, temperature 98.2 F (36.8 C), resp. rate 18, height 5\' 8"  (1.727 m), weight 77.5 kg, SpO2 99 %.            General: awake, alert, appropriate, sitting up at sink- putting on shirt with OTA in room;  NAD HENT: conjugate gaze; oropharynx moist- cervical collar in place CV: regular rate; no JVD Pulmonary: CTA B/L; no W/R/R- good air movement GI: soft, NT, ND, (+)BS Psychiatric: appropriate Neurological: poor memory, but otherwise, alert Musculoskeletal:     Cervical back: Neck supple. No tenderness.     Comments: RUE strength deltoid 5-/5; biceps 5/5; triceps 5-/5; WE 5-/5; Grip 5/5; LUE 4/5 Delt , bi tri grip LE strength HF 4/5; KE/KF 4+/5; DF 4-/5 and PF 4+/5 B/L  B/L no effusions of B/L knees  Skin:    General: Skin is warm and dry.     Comments: Multiple healed abrasions bilateral shins?neck incision CDI  Neurological:     Alert and oriented x 3. Normal insight and awareness. Intact Memory. Normal language and speech. Cranial nerve exam unremarkable     Comments: Speech clear with good breath support but cleared throat multiple times during conversation.  Decreased to light touch in tips of fingers and toes- but not in  dermatomal distribution B/L --no change Absent DTRs in LE's B/L --no change  Assessment/Plan: 1. Functional deficits which require 3+ hours per day of interdisciplinary therapy in a comprehensive inpatient rehab setting. Physiatrist is providing close team supervision and 24 hour management of active medical problems listed below. Physiatrist and rehab team continue to assess barriers to discharge/monitor patient progress toward functional and medical goals  Care Tool:  Bathing    Body parts bathed by patient: Right arm, Left arm, Chest, Abdomen, Front perineal area, Right upper leg, Left upper leg, Face   Body parts bathed by helper: Buttocks, Right lower leg, Left lower leg     Bathing assist Assist Level: Minimal Assistance - Patient > 75%     Upper Body Dressing/Undressing Upper body dressing   What is the patient wearing?: Pull over shirt, Orthosis Orthosis activity level: Performed by helper  Upper body assist Assist Level: Minimal Assistance - Patient > 75%    Lower Body Dressing/Undressing Lower body dressing      What is the patient wearing?: Pants     Lower body assist Assist  for lower body dressing: Contact Guard/Touching assist     Toileting Toileting    Toileting assist Assist for toileting: Moderate Assistance - Patient 50 - 74%     Transfers Chair/bed transfer  Transfers assist     Chair/bed transfer assist level: Contact Guard/Touching assist     Locomotion Ambulation   Ambulation assist      Assist level: Contact Guard/Touching assist Assistive device: Walker-rolling Max distance: 40'   Walk 10 feet activity   Assist  Walk 10 feet activity did not occur: Safety/medical concerns  Assist level: Contact Guard/Touching assist Assistive device: Walker-rolling   Walk 50 feet activity   Assist Walk 50 feet with 2 turns activity did not occur: Safety/medical concerns  Assist level: Contact Guard/Touching assist Assistive device:  Walker-rolling    Walk 150 feet activity   Assist Walk 150 feet activity did not occur: Safety/medical concerns  Assist level: Contact Guard/Touching assist Assistive device: Walker-rolling    Walk 10 feet on uneven surface  activity   Assist Walk 10 feet on uneven surfaces activity did not occur: Safety/medical concerns         Wheelchair     Assist Is the patient using a wheelchair?: Yes Type of Wheelchair: Manual    Wheelchair assist level: Supervision/Verbal cueing Max wheelchair distance: 100'    Wheelchair 50 feet with 2 turns activity    Assist        Assist Level: Supervision/Verbal cueing   Wheelchair 150 feet activity     Assist      Assist Level: Dependent - Patient 0%   Blood pressure 134/67, pulse 70, temperature 98.2 F (36.8 C), resp. rate 18, height 5\' 8"  (1.727 m), weight 77.5 kg, SpO2 99 %.  Medical Problem List and Plan: 1. 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  2.  Antithrombotics: -DVT/anticoagulation:  Mechanical: Sequential compression devices, below knee Bilateral lower extremities  6/23- should be able to start Lovenox over weekend- started 6/24, monitor neuro status and hgb 7/3 hgb stable at 9.5             -antiplatelet therapy: N/A 3. Pain Management: Oxycodone prn. Gabapentin 300 mg bid for neuropathy. Pain adequate with pain meds  7/6- pain tolerable and is requiring less pain meds - will con't reigmen 4. Mood/Sleep: LCSW to follow for evaluation and assist. Has been sleeping well.              -antipsychotic agents:  N/A 5. Neuropsych/cognition: This patient is capable of making decisions on his own behalf. 6.  Skin/Wound Care: Monitor incision for healing.   6/29- asked nursing to change dressing  7/5- incision looks great- staples out 7/1 7. Fluids/Electrolytes/Nutrition: Monitor I/O. Check CMET in am.  8. Cervical myelopathy s/p decompression C2/3 and C5/6:  --Collar to be on at all times.              --Staples to come out in 2 weeks (July 1st?)  w/NS follow up  7/1- remove staples today- placed order and ordered steristrips  7/2- staples out             --  dysphagia?  SLP for swallow evaluation.  9. HTN: Monitor BP TID. Continue Midamor, normodyne and amlodipine   6/26- BP elevated in 150s-160s systolic- if doesn't improve in next few days, (maxxed out on current BP meds), will check about other options, esp due to renal issues.   7/4- BP really elevated this AM- 150s-180s systolic- this is a big spike for him; if stays up, will add BP med. 7/6- BP doing better/controlled- con't regimen Vitals:   08/22/21 2001 08/23/21 0324  BP: (!) 149/62 134/67  Pulse: 82 70  Resp: 19 18  Temp: 98.8 F (37.1 C) 98.2 F (36.8 C)  SpO2: 100% 99%    10. RA: On [facility]rav[facility] per Dr. Cardell Peach in [place].  11. CKD 3 a:  BUN/SCr- 27/1.75 at admission-->improved since admisison --monitor with serial checks. Avoid nephrotoxic medications. 6/23- BUN 34 up from 22 and Cr 1.53- not sure exact baseline, but was 1.39 a few days ago- will give IVFs NS 75cc/hour x 24 hours and recheck in AM- think drinking impaired by swallowing issues.   6/27- will check BMP in AM  6/28- Cr 1.31 and NUN 24 down from 35- wil d/c IV  6/30- Cr 1.71- BUN up to 32- not sure if pt isn't drinking vs this his baseline? Will push fluids and recheck in AM  7/1- BUN 27 and Cr 1.55- down from 52 and 1.71- will recheck Monday  7/3 BUN improved today---continue to push fluids.. 7/6- will recheck in AM since has had issues     Latest Ref Rng & Units 08/20/2021    6:24 AM 08/18/2021    5:48 AM 08/16/2021    3:05 PM  BMP  Glucose 70 - 99 mg/dL 97  287  681   BUN 8  - 23 mg/dL 21  27  32   Creatinine 0.61 - 1.24 mg/dL 1.57  2.62  0.35   Sodium 135 - 145 mmol/L 137  134  133   Potassium 3.5 - 5.1 mmol/L 4.2  4.4  4.8   Chloride 98 - 111 mmol/L 103  101  97   CO2 22 - 32 mmol/L 26  26  26    Calcium 8.9 - 10.3 mg/dL 9.1  8.9  9.5     12. Acute on chronic anemia:     7/3 stable    Latest Ref Rng & Units 08/20/2021    6:24 AM 08/13/2021    6:12 AM 08/10/2021    5:11 AM  CBC  WBC 4.0 - 10.5 K/uL 5.5  6.3  8.8   Hemoglobin 13.0 - 17.0 g/dL 9.5  9.7  9.7   Hematocrit 39.0 - 52.0 % 29.6  30.1  29.2   Platelets 150 - 400 K/uL 274  232  200     13. Urinary urgency with incontinence-    -is now continent    LOS: 14 days A FACE TO FACE EVALUATION WAS PERFORMED  Etana Beets 08/23/2021, 8:09 AM

## 2021-08-24 LAB — GLUCOSE, CAPILLARY
Glucose-Capillary: 91 mg/dL (ref 70–99)
Glucose-Capillary: 110 mg/dL — ABNORMAL HIGH (ref 70–99)
Glucose-Capillary: 101 mg/dL — ABNORMAL HIGH (ref 70–99)
Glucose-Capillary: 100 mg/dL — ABNORMAL HIGH (ref 70–99)

## 2021-08-24 LAB — BASIC METABOLIC PANEL
Anion gap: 12 (ref 5–15)
BUN: 21 mg/dL (ref 8–23)
CO2: 24 mmol/L (ref 22–32)
Calcium: 9.2 mg/dL (ref 8.9–10.3)
Chloride: 103 mmol/L (ref 98–111)
Creatinine, Ser: 1.35 mg/dL — ABNORMAL HIGH (ref 0.61–1.24)
GFR, Estimated: 55 mL/min — ABNORMAL LOW (ref >60)
Glucose, Bld: 93 mg/dL (ref 70–99)
Potassium: 4.3 mmol/L (ref 3.5–5.1)
Sodium: 139 mmol/L (ref 135–145)

## 2021-08-24 NOTE — Progress Notes (Signed)
Physical Therapy Session Note  Patient Details  Name: Shawn Decker MRN: [id number] Date of Birth: 06-20-1945  Today's Date: 08/24/2021 PT Individual Time: 1008-1105 PT Individual Time Calculation (min): 57 min   Short Term Goals: Week 3:  PT Short Term Goal 1 (Week 3): =LTG due to ELOS  Skilled Therapeutic Interventions/Progress Updates: Pt presented in w/c agreeable to therapy. Pt states increased pain this am 5/10 with some pain returning to L flank as well as incision site. Pt propelled to day room with supervision using BUE for general conditioning. Pt ambulated 174f with w/c follow for safety but overall CGA. Pt with decreased B foot clearance and narrow BOS but no increased sway nor LOB. At high/ low mat pt performed standing balance activity passing 3lb weighted ball behind back CW/CCW x 10 each direction. Pt with some increased instability on second set of 10. Pt also participated in Sit to stand holding 3.3lb weighted ball 2 x 5 for static standing balance and BLE. Pt also participated in standing basketball toss utilizing both hands including standing bouts of 2-3 min before increased hip instability noted. Participated in seated ball kicks with 2.5lb cuffs at ankle for increased quad/hamstring recruitment. Pt then ambulated to NuStep with RW and CGA and participated L5 x 10 min for general conditioning with pt maintaining 50-60 SPM. Performed ambulatory transfer to return to w/c with CGA overall and pt propelled back to room in w/c with supervision. Pt remained in w/c at end of session with seat alarm on, call bell within reach and needs met.      Therapy Documentation Precautions:  Precautions Precautions: Cervical Precaution Booklet Issued: No Precaution Comments: verbally reviewed 3/3 cervical precautions Required Braces or Orthoses: Cervical Brace Cervical Brace: Hard collar, At all times Restrictions Weight Bearing Restrictions: No General:   Vital Signs:   Pain: Pain  Assessment Pain Scale: 0-10 Pain Score: 4  Pain Type: Acute pain Pain Location: Hip Pain Orientation: Left Pain Descriptors / Indicators: Aching Pain Frequency: Constant Pain Onset: Progressive Patients Stated Pain Goal: 0 Pain Intervention(s): Medication (See eMAR) Mobility:   Locomotion :    Trunk/Postural Assessment :    Balance:   Exercises:   Other Treatments:      Therapy/Group: Individual Therapy  Moshe Wenger 08/24/2021, 12:40 PM

## 2021-08-24 NOTE — Progress Notes (Signed)
Occupational Therapy Session Note  Patient Details  Name: Breck [place] MRN: [id number] Date of Birth: Jul 19, 1945  Today's Date: 08/24/2021 OT Individual Time: 5500-1642 OT Individual Time Calculation (min): 32 min    Short Term Goals: Week 2:  OT Short Term Goal 1 (Week 2): Pt will complete UB bathing and dressing with set up and CGA seated OT Short Term Goal 1 - Progress (Week 2): Met OT Short Term Goal 2 (Week 2): Pt will complete simple grooming task in standing wiht seated rests sink side after set up with min A OT Short Term Goal 2 - Progress (Week 2): Met OT Short Term Goal 3 (Week 2): Pt will complete toileting tasks with mod A (2/3 tasks) OT Short Term Goal 3 - Progress (Week 2): Met OT Short Term Goal 4 (Week 2): Pt will complete LB dressing tasks with min A OT Short Term Goal 4 - Progress (Week 2): Met  Skilled Therapeutic Interventions/Progress Updates:    Patient received seated in wheelchair and agreeable to OT therapy.  Worked in dayroom to address bimanual skills, fine motor coordination, and stand balance/tolerance.  Worked on shuffling and dealing cards - sorting cards in sitting then in standing.  Patient able to stand x 3 min 17 sec while doing task.  Patient loves playing cards and used to gamble.  Patient with improved ability with postural sway today - shifted toward front and back of foot and made adjustments to not lose balance.  Patient returned to room and all necessary items placed within reach.    Therapy Documentation Precautions:  Precautions Precautions: Cervical Precaution Booklet Issued: No Precaution Comments: verbally reviewed 3/3 cervical precautions Required Braces or Orthoses: Cervical Brace Cervical Brace: Hard collar, At all times Restrictions Weight Bearing Restrictions: No  Pain: Pain Assessment Pain Scale: 0-10 Pain Score: 4  Pain Type: Acute pain Pain Location: Hip Pain Orientation: Left Pain Descriptors / Indicators: Aching Pain  Frequency: Constant Pain Onset: Progressive Patients Stated Pain Goal: 0 Pain Intervention(s): Medication (See eMAR)     Therapy/Group: Individual Therapy  Mariah Milling 08/24/2021, 12:18 PM

## 2021-08-24 NOTE — Progress Notes (Signed)
PROGRESS NOTE   Subjective/Complaints:  Pt reports doing "pretty good".  Ate 50% of tray- happy with food, but not real hungry.   Cr down to 1.35 and BUN 21- doing better ROS:   Pt denies SOB, abd pain, CP, N/V/C/D, and vision changes  Objective:   No results found. No results for input(s): "WBC", "HGB", "HCT", "PLT" in the last 72 hours.   Recent Labs    08/24/21 0520  NA 139  K 4.3  CL 103  CO2 24  GLUCOSE 93  BUN 21  CREATININE 1.35*  CALCIUM 9.2     Intake/Output Summary (Last 24 hours) at 08/24/2021 0804 Last data filed at 08/24/2021 0731 Gross per 24 hour  Intake 597 ml  Output 400 ml  Net 197 ml        Physical Exam: Vital Signs Blood pressure 137/76, pulse 74, temperature 98.3 F (36.8 C), resp. rate 20, height 5\' 8"  (1.727 m), weight 77.5 kg, SpO2 99 %.             General: awake, alert, appropriate, sitting up in bed; brighter affect; NAD HENT: conjugate gaze; oropharynx moist; wearing cervical collar CV: regular rate; no JVD Pulmonary: CTA B/L; no W/R/R- good air movement GI: soft, NT, ND, (+)BS Psychiatric: appropriate; actually joking some Neurological: alert; decreased memory Musculoskeletal:     Cervical back: Neck supple. No tenderness.     Comments: RUE strength deltoid 5-/5; biceps 5/5; triceps 5-/5; WE 5-/5; Grip 5/5; LUE 4/5 Delt , bi tri grip LE strength HF 4/5; KE/KF 4+/5; DF 4-/5 and PF 4+/5 B/L  B/L no effusions of B/L knees  Skin:    General: Skin is warm and dry.     Comments: Multiple healed abrasions bilateral shins?neck incision CDI  Neurological:     Alert and oriented x 3. Normal insight and awareness. Intact Memory. Normal language and speech. Cranial nerve exam unremarkable     Comments: Speech clear with good breath support but cleared throat multiple times during conversation.  Decreased to light touch in tips of fingers and toes- but not in dermatomal  distribution B/L --no change Absent DTRs in LE's B/L --no change  Assessment/Plan: 1. Functional deficits which require 3+ hours per day of interdisciplinary therapy in a comprehensive inpatient rehab setting. Physiatrist is providing close team supervision and 24 hour management of active medical problems listed below. Physiatrist and rehab team continue to assess barriers to discharge/monitor patient progress toward functional and medical goals  Care Tool:  Bathing    Body parts bathed by patient: Right arm, Left arm, Chest, Abdomen, Front perineal area, Right upper leg, Left upper leg, Face   Body parts bathed by helper: Buttocks, Right lower leg, Left lower leg     Bathing assist Assist Level: Minimal Assistance - Patient > 75%     Upper Body Dressing/Undressing Upper body dressing   What is the patient wearing?: Pull over shirt Orthosis activity level: Performed by helper  Upper body assist Assist Level: Contact Guard/Touching assist    Lower Body Dressing/Undressing Lower body dressing      What is the patient wearing?: Pants     Lower body  assist Assist for lower body dressing: Contact Guard/Touching assist     Toileting Toileting    Toileting assist Assist for toileting: Moderate Assistance - Patient 50 - 74%     Transfers Chair/bed transfer  Transfers assist     Chair/bed transfer assist level: Contact Guard/Touching assist     Locomotion Ambulation   Ambulation assist      Assist level: Contact Guard/Touching assist Assistive device: Walker-rolling Max distance: 40'   Walk 10 feet activity   Assist  Walk 10 feet activity did not occur: Safety/medical concerns  Assist level: Contact Guard/Touching assist Assistive device: Walker-rolling   Walk 50 feet activity   Assist Walk 50 feet with 2 turns activity did not occur: Safety/medical concerns  Assist level: Contact Guard/Touching assist Assistive device: Walker-rolling    Walk  150 feet activity   Assist Walk 150 feet activity did not occur: Safety/medical concerns  Assist level: Contact Guard/Touching assist Assistive device: Walker-rolling    Walk 10 feet on uneven surface  activity   Assist Walk 10 feet on uneven surfaces activity did not occur: Safety/medical concerns         Wheelchair     Assist Is the patient using a wheelchair?: Yes Type of Wheelchair: Manual    Wheelchair assist level: Supervision/Verbal cueing Max wheelchair distance: 100'    Wheelchair 50 feet with 2 turns activity    Assist        Assist Level: Supervision/Verbal cueing   Wheelchair 150 feet activity     Assist      Assist Level: Dependent - Patient 0%   Blood pressure 137/76, pulse 74, temperature 98.3 F (36.8 C), resp. rate 20, height 5\' 8"  (1.727 m), weight 77.5 kg, SpO2 99 %.  Medical Problem List and Plan: 1. 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  Antithrombotics: -DVT/anticoagulation:  Mechanical: Sequential compression devices, below knee Bilateral lower extremities  6/23- should be able to start Lovenox over weekend- started 6/24, monitor neuro status and hgb 7/3 hgb stable at 9.5             -antiplatelet therapy: N/A 3. Pain Management: Oxycodone prn. Gabapentin 300 mg bid for neuropathy. Pain adequate with pain meds  7/6- pain tolerable and is requiring less pain meds - will con't reigmen 4. Mood/Sleep: LCSW to follow for evaluation and assist. Has been sleeping well.              -antipsychotic agents:  N/A 5. Neuropsych/cognition: This patient is capable of making decisions on his own behalf. 6. Skin/Wound Care: Monitor  incision for healing.   6/29- asked nursing to change dressing  7/7- staples out- almost completely healed 7. Fluids/Electrolytes/Nutrition: Monitor I/O. Check CMET in am.  8. Cervical myelopathy s/p decompression C2/3 and C5/6:  --Collar to be on at all times.              --dysphagia?  SLP for swallow evaluation.  9. HTN: Monitor BP TID. Continue Midamor, normodyne and amlodipine   6/26- BP elevated in 150s-160s systolic- if  doesn't improve in next few days, (maxxed out on current BP meds), will check about other options, esp due to renal issues.   7/4- BP really elevated this AM- 150s-180s systolic- this is a big spike for him; if stays up, will add BP med. 7/7- BP still somewhat labile- maxxed out on 3 meds- will con't to monitor trend Vitals:   08/23/21 1949 08/24/21 0423  BP: (!) 165/71 137/76  Pulse: 80 74  Resp: 20 20  Temp: 98.2 F (36.8 C) 98.3 F (36.8 C)  SpO2: 99% 99%    10. RA: On Arava per Dr. Cardell Peach in [place].  11. CKD 3 a:  BUN/SCr- 27/1.75 at admission-->improved since admisison --monitor with serial checks. Avoid nephrotoxic medications. 6/23- BUN 34 up from 22 and Cr 1.53- not sure exact baseline, but was 1.39 a few days ago- will give IVFs NS 75cc/hour x 24 hours and recheck in AM- think drinking impaired by swallowing issues.   6/27- will check BMP in AM  6/28- Cr 1.31 and NUN 24 down from 35- wil d/c IV  6/30- Cr 1.71- BUN up to 32- not sure if pt isn't drinking vs this his baseline? Will push fluids and recheck in AM  7/1- BUN 27 and Cr 1.55- down from 52 and 1.71- will recheck Monday  7/3 BUN improved today---continue to push fluids.. 7/7- doing great with fluids- Cr down to 1.35 and BUN 21- which is at baseline now-     Latest Ref Rng & Units 08/24/2021    5:20 AM 08/20/2021    6:24 AM 08/18/2021    5:48 AM  BMP  Glucose 70 - 99 mg/dL 93  97  595   BUN 8 - 23 mg/dL 21  21  27    Creatinine 0.61 - 1.24 mg/dL 6.38  7.56  4.33   Sodium 135 - 145 mmol/L 139  137  134    Potassium 3.5 - 5.1 mmol/L 4.3  4.2  4.4   Chloride 98 - 111 mmol/L 103  103  101   CO2 22 - 32 mmol/L 24  26  26    Calcium 8.9 - 10.3 mg/dL 9.2  9.1  8.9     12. Acute on chronic anemia:     7/3 stable    Latest Ref Rng & Units 08/20/2021    6:24 AM 08/13/2021    6:12 AM 08/10/2021    5:11 AM  CBC  WBC 4.0 - 10.5 K/uL 5.5  6.3  8.8   Hemoglobin 13.0 - 17.0 g/dL 9.5  9.7  9.7   Hematocrit 39.0 - 52.0 % 29.6  30.1  29.2   Platelets 150 - 400 K/uL 274  232  200     13. Urinary urgency with incontinence-    -is now continent    LOS: 15 days A FACE TO FACE EVALUATION WAS PERFORMED  Mabell Esguerra 08/24/2021, 8:04 AM

## 2021-08-24 NOTE — Progress Notes (Signed)
Occupational Therapy Session Note  Patient Details  Name: Shawn Decker MRN: [id number] Date of Birth: Sep 18, 1945  Today's Date: 08/24/2021 OT Individual Time: 6440-3474 & 1530-1630 OT Individual Time Calculation (min): 75 min & 60 min   Short Term Goals: Week 2:  OT Short Term Goal 1 (Week 2): Pt will complete UB bathing and dressing with set up and CGA seated OT Short Term Goal 1 - Progress (Week 2): Met OT Short Term Goal 2 (Week 2): Pt will complete simple grooming task in standing wiht seated rests sink side after set up with min A OT Short Term Goal 2 - Progress (Week 2): Met OT Short Term Goal 3 (Week 2): Pt will complete toileting tasks with mod A (2/3 tasks) OT Short Term Goal 3 - Progress (Week 2): Met OT Short Term Goal 4 (Week 2): Pt will complete LB dressing tasks with min A OT Short Term Goal 4 - Progress (Week 2): Met  Skilled Therapeutic Interventions/Progress Updates:     Session 1: Upon OT arrival, pt semi recumbent in bed reporting 7/10 pain in neck. RN aware. Pt agreeable to OT treatment session. Treatment intervention with a focus on self care retraining, functional mobility/transfers, and fine motor coordination. Pt completes supine to sit transfer with SBA and donns pants using reacher with CGA. Shoes donned for pt for time management. Pt completes bed to w/c transfer using RW and CGA. Pt propels himself to sink and completes oral care with Supervision. Pt completes sit to stand transfer with RW and CGA and ambulates to bathroom to complete toilet transfer and toileting with CGA. Pt ambulates back to his w/c with CGA using RW. Therapist transported pt to therapy gym via w/c with total A for time management. Pt sits at tabletop to flip over quirkle game pieces from blank side to colored side using B Ues with min difficulty. Pt sorts game pieces base shape without errors then stacks game pieces ontop of each other with min difficulty. Pt knocks over tiles 1 time during task.  Pt then picks up tiles in groups of 3, one at a time using in hand manipulation and returns them to game tile bag. Pt with min difficulty to complete and requires increased time and effort. Pt was returned to his room via w/c and total A for time management and left in w/c with all needs met.    Session 2: Upon OT arrival, pt seated in w/c with 2 sons present. Pt reports pain at a 5/10 and declines medication. Pt agreeable to OT treatment session. Treatment intervention with a focus on self care retraining. Pt performs shower ADL at the levels listed below. Increased time required as pt has not showered since admission per pt request. Pt completes standing with CGA and functional transfers with CGA. Pt ambulates to/from bathroom with RW and CGA. Pt transfers into bed with CGA and performs sit to supine transfer with Supervision. Pt left in bed at end of session with all needs met. Pt progressing towards stated OT goals and continues to benefit from OT services to achieve highest level of independence.   Therapy Documentation Precautions:  Precautions Precautions: Cervical Precaution Booklet Issued: No Precaution Comments: verbally reviewed 3/3 cervical precautions Required Braces or Orthoses: Cervical Brace Cervical Brace: Hard collar, At all times Restrictions Weight Bearing Restrictions: No  ADL: Grooming: Supervision/safety Where Assessed-Grooming:  (shower) Upper Body Bathing: Supervision/safety Where Assessed-Upper Body Bathing: Shower Lower Body Bathing: Contact guard Where Assessed-Lower Body Bathing: Administrator, sports  Dressing: Moderate assistance (assist to doff/donn orthosis) Where Assessed-Upper Body Dressing:  (shower) Lower Body Dressing: Minimal assistance (to doff shoes) Where Assessed-Lower Body Dressing:  (shower) Walk-In Shower Transfer: Curator Method: Heritage manager: Radio broadcast assistant ADL Comments: Pain and overall  weakness especially LE's limits all ADL's and transfers, introduced reacher and spinal precautions   Therapy/Group: Individual Therapy  Marvetta Gibbons 08/24/2021, 8:13 AM

## 2021-08-25 LAB — GLUCOSE, CAPILLARY
Glucose-Capillary: 89 mg/dL (ref 70–99)
Glucose-Capillary: 113 mg/dL — ABNORMAL HIGH (ref 70–99)
Glucose-Capillary: 117 mg/dL — ABNORMAL HIGH (ref 70–99)
Glucose-Capillary: 158 mg/dL — ABNORMAL HIGH (ref 70–99)

## 2021-08-25 NOTE — Progress Notes (Signed)
PROGRESS NOTE   Subjective/Complaints: Pt had an okay night, getting ready to work with PT today. With help got to dangle to side of bed.  Still reports decreased appetite. Following labs- Cr down to 1.35, BUN 21.  ROS:   Pt denies SOB, abd pain, CP, N/V/C/D, and vision changes  Objective:   No results found. No results for input(s): "WBC", "HGB", "HCT", "PLT" in the last 72 hours.   Recent Labs    08/24/21 0520  NA 139  K 4.3  CL 103  CO2 24  GLUCOSE 93  BUN 21  CREATININE 1.35*  CALCIUM 9.2     Intake/Output Summary (Last 24 hours) at 08/25/2021 1612 Last data filed at 08/25/2021 0900 Gross per 24 hour  Intake 598 ml  Output 900 ml  Net -302 ml        Physical Exam: Vital Signs Blood pressure 137/69, pulse 74, temperature 98.6 F (37 C), resp. rate 18, height 5\' 8"  (1.727 m), weight 77.5 kg, SpO2 99 %.   General: awake, alert, appropriate, sitting up in bed; brighter affect; NAD HENT: conjugate gaze; oropharynx moist; wearing cervical collar CV: regular rate; no JVD Pulmonary: CTA B/L; no W/R/R- good air movement GI: soft, NT, ND, (+)BS Psychiatric: appropriate; actually joking some Neurological: alert; decreased memory Musculoskeletal:     Cervical back: Neck supple. No tenderness.     Comments: RUE strength deltoid 5-/5; biceps 5/5; triceps 5-/5; WE 5-/5; Grip 5/5; LUE 4/5 Delt , bi tri grip LE strength HF 4/5; KE/KF 4+/5; DF 4-/5 and PF 4+/5 B/L  B/L no effusions of B/L knees  Skin:    General: Skin is warm and dry.     Comments: Multiple healed abrasions bilateral shins, neck incision CDI  Neurological:     Alert and oriented x 3. Normal insight and awareness. Intact Memory. Normal language and speech. Cranial nerve exam unremarkable     Comments: Speech clear today, no throat clearing noticed, but has on prior exams had issues with needing to clear throat multiple times. Decreased to light  touch in tips of fingers and toes- but not in dermatomal distribution B/L --no change Absent DTRs in LE's B/L --no change, tested again today, still absent DTR's BLE.  Assessment/Plan: 1. Functional deficits which require 3+ hours per day of interdisciplinary therapy in a comprehensive inpatient rehab setting. Physiatrist is providing close team supervision and 24 hour management of active medical problems listed below. Physiatrist and rehab team continue to assess barriers to discharge/monitor patient progress toward functional and medical goals  Care Tool:  Bathing    Body parts bathed by patient: Right arm, Left arm, Chest, Abdomen, Front perineal area, Right upper leg, Left upper leg, Face, Right lower leg, Left lower leg, Buttocks   Body parts bathed by helper: Buttocks, Right lower leg, Left lower leg     Bathing assist Assist Level: Contact Guard/Touching assist     Upper Body Dressing/Undressing Upper body dressing   What is the patient wearing?: Pull over shirt, Orthosis Orthosis activity level: Performed by helper  Upper body assist Assist Level: Moderate Assistance - Patient 50 - 74%    Lower Body Dressing/Undressing  Lower body dressing      What is the patient wearing?: Pants     Lower body assist Assist for lower body dressing: Contact Guard/Touching assist     Toileting Toileting    Toileting assist Assist for toileting: Contact Guard/Touching assist     Transfers Chair/bed transfer  Transfers assist     Chair/bed transfer assist level: Contact Guard/Touching assist     Locomotion Ambulation   Ambulation assist      Assist level: Contact Guard/Touching assist Assistive device: Walker-rolling Max distance: 40'   Walk 10 feet activity   Assist  Walk 10 feet activity did not occur: Safety/medical concerns  Assist level: Contact Guard/Touching assist Assistive device: Walker-rolling   Walk 50 feet activity   Assist Walk 50 feet with  2 turns activity did not occur: Safety/medical concerns  Assist level: Contact Guard/Touching assist Assistive device: Walker-rolling    Walk 150 feet activity   Assist Walk 150 feet activity did not occur: Safety/medical concerns  Assist level: Contact Guard/Touching assist Assistive device: Walker-rolling    Walk 10 feet on uneven surface  activity   Assist Walk 10 feet on uneven surfaces activity did not occur: Safety/medical concerns         Wheelchair     Assist Is the patient using a wheelchair?: Yes Type of Wheelchair: Manual    Wheelchair assist level: Supervision/Verbal cueing Max wheelchair distance: 100'    Wheelchair 50 feet with 2 turns activity    Assist        Assist Level: Supervision/Verbal cueing   Wheelchair 150 feet activity     Assist      Assist Level: Dependent - Patient 0%   Blood pressure 137/69, pulse 74, temperature 98.6 F (37 C), resp. rate 18, height 5\' 8"  (1.727 m), weight 77.5 kg, SpO2 99 %.  Medical Problem List and Plan: 1. 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  Antithrombotics: -DVT/anticoagulation:  Mechanical: Sequential compression devices, below knee Bilateral lower extremities  6/23- should be able to start Lovenox over weekend- started 6/24, monitor neuro status and hgb 7/3 hgb stable at 9.5             -antiplatelet therapy: N/A 3. Pain Management: Oxycodone prn. Gabapentin 300 mg bid for neuropathy. Pain adequate with pain meds  7/6- pain tolerable and is requiring less pain meds - will con't reigmen 4. Mood/Sleep: LCSW to follow for evaluation and assist. Has been sleeping well.               -antipsychotic agents:  N/A 5. Neuropsych/cognition: This patient is capable of making decisions on his own behalf. 6. Skin/Wound Care: Monitor incision for healing.   6/29- asked nursing to change dressing  7/7- staples out- almost completely healed 7. Fluids/Electrolytes/Nutrition: Monitor I/O. Check CMET in am.  8. Cervical myelopathy s/p decompression C2/3 and C5/6:  --Collar to be on at all times.              --dysphagia?  SLP for swallow evaluation.  9. HTN: Monitor  BP TID. Continue Midamor, normodyne and amlodipine   6/26- BP elevated in 150s-160s systolic- if doesn't improve in next few days, (maxxed out on current BP meds), will check about other options, esp due to renal issues.   7/4- BP really elevated this AM- 150s-180s systolic- this is a big spike for him; if stays up, will add BP med. 7/7- BP still somewhat labile- maxxed out on 3 meds- will con't to monitor trend 7/8 Reasonable BP control past 24 hrs. Vitals:   08/25/21 0312 08/25/21 1358  BP: 122/61 137/69  Pulse: 70 74  Resp: 18 18  Temp: 98 F (36.7 C) 98.6 F (37 C)  SpO2: 99% 99%    10. RA: On Arava per Dr. Cardell Peach in [place].  11. CKD 3 a:  BUN/SCr- 27/1.75 at admission-->improved since admisison --monitor with serial checks. Avoid nephrotoxic medications. 6/23- BUN 34 up from 22 and Cr 1.53- not sure exact baseline, but was 1.39 a few days ago- will give IVFs NS 75cc/hour x 24 hours and recheck in AM- think drinking impaired by swallowing issues.   6/27- will check BMP in AM  6/28- Cr 1.31 and NUN 24 down from 35- wil d/c IV  6/30- Cr 1.71- BUN up to 32- not sure if pt isn't drinking vs this his baseline? Will push fluids and recheck in AM  7/1- BUN 27 and Cr 1.55- down from 52 and 1.71- will recheck Monday  7/3 BUN improved today---continue to push fluids.. 7/7- doing great with fluids- Cr down to 1.35 and BUN 21- which is at baseline now-  7/8 Continue to push fluids and trend with weekly labs    Latest Ref Rng &  Units 08/24/2021    5:20 AM 08/20/2021    6:24 AM 08/18/2021    5:48 AM  BMP  Glucose 70 - 99 mg/dL 93  97  993   BUN 8 - 23 mg/dL 21  21  27    Creatinine 0.61 - 1.24 mg/dL  7.16  9.67   Sodium 135 - 145 mmol/L 139  137  134   Potassium 3.5 - 5.1 mmol/L 4.3  4.2  4.4   Chloride 98 - 111 mmol/L 103  103  101   CO2 22 - 32 mmol/L 24  26  26    Calcium 8.9 - 10.3 mg/dL 9.2  9.1  8.9     12. Acute on chronic anemia:     7/3 stable    Latest Ref Rng & Units 08/20/2021    6:24 AM 08/13/2021    6:12 AM 08/10/2021    5:11 AM  CBC  WBC 4.0 - 10.5 K/uL 5.5  6.3  8.8   Hemoglobin 13.0 - 17.0 g/dL 9.5  9.7  9.7   Hematocrit 39.0 - 52.0 % 29.6  30.1  29.2   Platelets 150 - 400 K/uL 274  232  200     13. Urinary urgency with incontinence-    -is now continent      7/8 Voids fine with urinal    LOS: 16 days A FACE TO FACE EVALUATION WAS PERFORMED  08/15/2021 08/25/2021, 4:12 PM

## 2021-08-25 NOTE — Progress Notes (Signed)
Occupational Therapy Session Note  Patient Details  Name: Shawn Decker MRN: [id number] Date of Birth: Jun 20, 1945  Today's Date: 08/25/2021 OT Individual Time: 1304-1405 OT Individual Time Calculation (min): 61 min    Short Term Goals: Week 2:  OT Short Term Goal 1 (Week 2): Pt will complete UB bathing and dressing with set up and CGA seated OT Short Term Goal 1 - Progress (Week 2): Met OT Short Term Goal 2 (Week 2): Pt will complete simple grooming task in standing wiht seated rests sink side after set up with min A OT Short Term Goal 2 - Progress (Week 2): Met OT Short Term Goal 3 (Week 2): Pt will complete toileting tasks with mod A (2/3 tasks) OT Short Term Goal 3 - Progress (Week 2): Met OT Short Term Goal 4 (Week 2): Pt will complete LB dressing tasks with min A OT Short Term Goal 4 - Progress (Week 2): Met  Skilled Therapeutic Interventions/Progress Updates:  Pt awake in bed upon OT arrival to the room. Pt reports, "I just have this pain here (L hip/lower back), but this heating pack helped." Pt in agreement for OT session. Pt participates in ADL tasks during session with managing cervical collar, functional mobility, tolieting tasks, and education on AE/DME to aid in DC planning and independence after DC.   Therapy Documentation Precautions:  Precautions Precautions: Cervical Precaution Booklet Issued: No Precaution Comments: verbally reviewed 3/3 cervical precautions Required Braces or Orthoses: Cervical Brace Cervical Brace: Hard collar, At all times Restrictions Weight Bearing Restrictions: No Vital Signs: Therapy Vitals Temp: 98.6 F (37 C) Pulse Rate: 74 Resp: 18 BP: 137/69 Patient Position (if appropriate): Lying Oxygen Therapy SpO2: 99 % O2 Device: Room AirPlease see "Flowsheet" for most recent vitals charted by nursing. Pain: Pain Assessment Pain Scale: 0-10 Pain Score: 6  Pain Type: Acute pain Pain Location: Neck Pain Descriptors / Indicators:  Sharp Pain Onset: On-going Pain Intervention(s): Distraction;Emotional support  ADL: Grooming: Contact guard (Pt able to perform hand hygiene with CGA standing at the sink with FWW for safety.) Where Assessed-Grooming: Standing at sink Toileting: Contact guard (Pt able to perform clothing management for toileting with CGA for standing balance with use of FWW. Pt only voided bladder therefore no peri-hygiene needed.) Where Assessed-Toileting: Glass blower/designer: Contact guard (Pt able to complete ambulatory transfer from EOB > toilet with CGA with use of FWW.) Toilet Transfer Method: Counselling psychologist: Grab bars, Extra wide drop arm bedside commode (BSC placed over toilet for elevated toileting surface) ADL Comments: Pt able to complete ambulatory transfer from EOB > toilet > sink > EOB with CGA and use of FWW. Pt reports increased pain near L hip/lower back which causes instability when transferring back to bed. Education and handout provided to pt on importance of obtaining AE for ADLs in order to improve independence after DC. OT highlighted recommended equipment on AE/DME handout to allow pt to give handout to family for assistance in obtaining these tools.  AE/DME Education: Education and handout provided to pt on recommended AE/DME needed for DC to improve independence and safety with ADLs/functional mobility. Pt reports potentially having AE for bathing and LB dressing already at  home from previous surgeries. Pt reports practicing AE with LB dressing in previous OT session and having no concerns with using these after DC.  Cervical Collar Management: Pt reports that son has been trained on how to manage cervical collar. Pt provides education on importance of changing pads with bathing tasks.  Pt reported taking a shower yesterday and was encouraged to change pads today. OT provides education and demo on techniques to change the pads while seated EOB. Pt able to then  change pads on the front portion of the collar and don with minimal assistance. Pt verbalizes understanding.   Fine Motor Strengthening: Pt reported having red putty, however, was unable to locate in room. OT then provided pt with a blue foam block and provided education for pt to squeeze in order to improve grip strength. Pt demo'd and verbalized understanding.   Pt returned to bed at end of session. Pt left resting comfortably in bed with personal belongings and call light within reach, bed alarm on and activated, bed in low position, 3 bed rails up, and comfort needs attended to.   Therapy/Group: Individual Therapy  Barbee Shropshire 08/25/2021, 2:16 PM

## 2021-08-25 NOTE — Progress Notes (Addendum)
Physical Therapy Session Note  Patient Details  Name: Shawn Decker MRN: [id number] Date of Birth: 11/15/1945  Today's Date: 08/25/2021 PT Individual Time: 0917-1015 PT Individual Time Calculation (min): 58 min   Short Term Goals: Week 1:  PT Short Term Goal 1 (Week 1): pt will propel w/c x 50 ft PT Short Term Goal 1 - Progress (Week 1): Met PT Short Term Goal 2 (Week 1): Pt will initiate gait training with RW PT Short Term Goal 2 - Progress (Week 1): Met PT Short Term Goal 3 (Week 1): Pt will iniate stand pivot transfer training PT Short Term Goal 3 - Progress (Week 1): Met Week 2:  PT Short Term Goal 1 (Week 2): Pt will ambulate x 100 ft with LRAD and assist x 1 consistently PT Short Term Goal 1 - Progress (Week 2): Met PT Short Term Goal 2 (Week 2): Pt will navigate 6" stairs with 2 handrails and mod A PT Short Term Goal 2 - Progress (Week 2): Met PT Short Term Goal 3 (Week 2): Pt will propel w/c x 150 ft at Supervision level PT Short Term Goal 3 - Progress (Week 2): Met Week 3:  PT Short Term Goal 1 (Week 3): =LTG due to ELOS  Skilled Therapeutic Interventions/Progress Updates:  Pt initially uspine, denies pain, HCC in place  Supine to sit w/use of rails and hob elevated w/cga Lifts feet for therapist to thread pants to hips. Sit to stand w/RW and min steadying assist, raises pants w/min assist. stand pivot transfer to wc w/cga  Gait 111f w/RW, crouched gait posture w/mild hip, knee flexion, forward lean, downward gaze.  C/o legs feeling 'mushy".  In parallel bars LE strengthening + balance: Lateral step ups using 3.75in step w/focus on terminal qnee extension/isolation, upright posture, light use of Ues for steadying  Rx20, L x 15 cga  Lateral wide/high stepping over 5x5 blue wedge repeated alternating x 10 reps w/cues for upright posture, mirror for assist/feedback on foot placement. Light use of rails for steadying/balance, cga.  Sidestepping length of bars x [id number]  w/yellow theraband resistance placed just above knees, cues for posrture/alignment, cga, use of bars for stability.  Standing heel raises, cues for posture/isolation/tactile cues to maintain knee extension x 20 reps, cga, rails for balance  Standing hs curls x 10, stabilizing limb becomes weak w/increasing hip/knee flexion due to fatigue w/task.  Pt required seated rest between each standing therex due to fLE fatigue, profuse sweating/premorbid condition.  Pt left oob in wc w/alarm belt set and needs in reach     Therapy Documentation Precautions:  Precautions Precautions: Cervical Precaution Booklet Issued: No Precaution Comments: verbally reviewed 3/3 cervical precautions Required Braces or Orthoses: Cervical Brace Cervical Brace: Hard collar, At all times Restrictions Weight Bearing Restrictions: No   Therapy/Group: Individual Therapy BaCallie FieldingPTWakefield-Peacedale/09/2021, 12:24 PM

## 2021-08-26 LAB — GLUCOSE, CAPILLARY
Glucose-Capillary: 89 mg/dL (ref 70–99)
Glucose-Capillary: 113 mg/dL — ABNORMAL HIGH (ref 70–99)
Glucose-Capillary: 121 mg/dL — ABNORMAL HIGH (ref 70–99)
Glucose-Capillary: 99 mg/dL (ref 70–99)

## 2021-08-26 NOTE — Progress Notes (Signed)
PROGRESS NOTE   Subjective/Complaints: Pt seen in bed had breakfast and slept well, says "he is doing fine".  Still reports decreased appetite.  Following labs- Cr down to 1.35, BUN 21.  ROS:   Pt denies SOB, abd pain, CP, N/V/C/D, and vision changes  Objective:   No results found. No results for input(s): "WBC", "HGB", "HCT", "PLT" in the last 72 hours.   Recent Labs    08/24/21 0520  NA 139  K 4.3  CL 103  CO2 24  GLUCOSE 93  BUN 21  CREATININE 1.35*  CALCIUM 9.2     Intake/Output Summary (Last 24 hours) at 08/26/2021 1117 Last data filed at 08/26/2021 0388 Gross per 24 hour  Intake 960 ml  Output 1400 ml  Net -440 ml        Physical Exam: Vital Signs Blood pressure 134/62, pulse 65, temperature (!) 97.5 F (36.4 C), temperature source Oral, resp. rate 18, height 5\' 8"  (1.727 m), weight 77.5 kg, SpO2 99 %.   General: awake, alert, appropriate, sitting up in bed; brighter affect; NAD HENT: conjugate gaze; oropharynx moist; wearing cervical collar CV: regular rate; no JVD Pulmonary: CTA B/L; no W/R/R- good air movement GI: soft, NT, ND, (+)BS Psychiatric: appropriate; actually joking some Neurological: alert; decreased memory Musculoskeletal:     Cervical back: Neck supple. No tenderness.     Comments: RUE strength deltoid 5-/5; biceps 5/5; triceps 5-/5; WE 5-/5; Grip 5/5; LUE 4/5 Delt , bi tri grip LE strength HF 4/5; KE/KF 4+/5; DF 4-/5 and PF 4+/5 B/L  B/L no effusions of B/L knees  Skin:    General: Skin is warm and dry.     Comments: Multiple healed abrasions bilateral shins, neck incision CDI  Neurological:     Alert and oriented x 3. Normal insight and awareness. Intact Memory. Normal language and speech. Cranial nerve exam unremarkable     Comments: Speech clear today, no throat clearing noticed, but has on prior exams had issues with needing to clear throat multiple times. Decreased to light  touch in tips of fingers and toes- but not in dermatomal distribution B/L --no change Absent DTRs in LE's B/L --no change, tested again today, still absent DTR's BLE.  Assessment/Plan: 1. Functional deficits which require 3+ hours per day of interdisciplinary therapy in a comprehensive inpatient rehab setting. Physiatrist is providing close team supervision and 24 hour management of active medical problems listed below. Physiatrist and rehab team continue to assess barriers to discharge/monitor patient progress toward functional and medical goals  Care Tool:  Bathing    Body parts bathed by patient: Right arm, Left arm, Chest, Abdomen, Front perineal area, Right upper leg, Left upper leg, Face, Right lower leg, Left lower leg, Buttocks   Body parts bathed by helper: Buttocks, Right lower leg, Left lower leg     Bathing assist Assist Level: Contact Guard/Touching assist     Upper Body Dressing/Undressing Upper body dressing   What is the patient wearing?: Pull over shirt, Orthosis Orthosis activity level: Performed by helper  Upper body assist Assist Level: Supervision/Verbal cueing    Lower Body Dressing/Undressing Lower body dressing  What is the patient wearing?: Pants, Underwear/pull up     Lower body assist Assist for lower body dressing: Contact Guard/Touching assist     Toileting Toileting    Toileting assist Assist for toileting: Contact Guard/Touching assist     Transfers Chair/bed transfer  Transfers assist     Chair/bed transfer assist level: Contact Guard/Touching assist     Locomotion Ambulation   Ambulation assist      Assist level: Contact Guard/Touching assist Assistive device: Walker-rolling Max distance: 40'   Walk 10 feet activity   Assist  Walk 10 feet activity did not occur: Safety/medical concerns  Assist level: Contact Guard/Touching assist Assistive device: Walker-rolling   Walk 50 feet activity   Assist Walk 50  feet with 2 turns activity did not occur: Safety/medical concerns  Assist level: Contact Guard/Touching assist Assistive device: Walker-rolling    Walk 150 feet activity   Assist Walk 150 feet activity did not occur: Safety/medical concerns  Assist level: Contact Guard/Touching assist Assistive device: Walker-rolling    Walk 10 feet on uneven surface  activity   Assist Walk 10 feet on uneven surfaces activity did not occur: Safety/medical concerns         Wheelchair     Assist Is the patient using a wheelchair?: Yes Type of Wheelchair: Manual    Wheelchair assist level: Supervision/Verbal cueing Max wheelchair distance: 100'    Wheelchair 50 feet with 2 turns activity    Assist        Assist Level: Supervision/Verbal cueing   Wheelchair 150 feet activity     Assist      Assist Level: Dependent - Patient 0%   Blood pressure 134/62, pulse 65, temperature (!) 97.5 F (36.4 C), temperature source Oral, resp. rate 18, height 5\' 8"  (1.727 m), weight 77.5 kg, SpO2 99 %.  Medical Problem List and Plan: 1. 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  Antithrombotics: -DVT/anticoagulation:  Mechanical: Sequential compression devices, below knee Bilateral lower extremities  6/23- should be able to start Lovenox over weekend- started 6/24, monitor neuro status and hgb 7/3 hgb stable at 9.5             -antiplatelet therapy: N/A 3. Pain Management: Oxycodone prn. Gabapentin 300 mg bid for neuropathy. Pain adequate with pain meds  7/6- pain tolerable and is requiring less pain meds - will con't reigmen 4. Mood/Sleep: LCSW to follow for evaluation and assist.  Has been sleeping well.              -antipsychotic agents:  N/A 5. Neuropsych/cognition: This patient is capable of making decisions on his own behalf. 6. Skin/Wound Care: Monitor incision for healing.   6/29- asked nursing to change dressing  7/7- staples out- almost completely healed 7. Fluids/Electrolytes/Nutrition: Monitor I/O. Check CMET in am.  8. Cervical myelopathy s/p decompression C2/3 and C5/6:  --Collar to be on at all times.              --dysphagia?  SLP for swallow evaluation.  9. HTN: Monitor BP TID.  Continue Midamor, normodyne and amlodipine   6/26- BP elevated in 150s-160s systolic- if doesn't improve in next few days, (maxxed out on current BP meds), will check about other options, esp due to renal issues.   7/4- BP really elevated this AM- 150s-180s systolic- this is a big spike for him; if stays up, will add BP med. 7/7- BP still somewhat labile- maxxed out on 3 meds- will con't to monitor trend 7/9 Reasonable BP control past 24 hrs.  On max dose of 3 antihypertensives Vitals:   08/25/21 1922 08/26/21 0321  BP: (!) 139/59 134/62  Pulse: 76 65  Resp: 18 18  Temp: 98.8 F (37.1 C) (!) 97.5 F (36.4 C)  SpO2: 99% 99%    10. RA: On Arava per Dr. Cardell Peach in [place].  11. CKD 3 a:  BUN/SCr- 27/1.75 at admission-->improved since admisison --monitor with serial checks. Avoid nephrotoxic medications. 6/23- BUN 34 up from 22 and Cr 1.53- not sure exact baseline, but was 1.39 a few days ago- will give IVFs NS 75cc/hour x 24 hours and recheck in AM- think drinking impaired by swallowing issues.   6/27- will check BMP in AM  6/28- Cr 1.31 and NUN 24 down from 35- wil d/c IV  6/30- Cr 1.71- BUN up to 32- not sure if pt isn't drinking vs this his baseline? Will push fluids and recheck in AM  7/1- BUN 27 and Cr 1.55- down from 52 and 1.71- will recheck Monday  7/3 BUN improved today---continue to push fluids.. 7/7- doing great with fluids- Cr down to 1.35 and BUN 21- which is at  baseline now-  7/9 Continue to push fluids and trend with weekly labs    Latest Ref Rng & Units 08/24/2021    5:20 AM 08/20/2021    6:24 AM 08/18/2021    5:48 AM  BMP  Glucose 70 - 99 mg/dL 93  97  161   BUN 8 - 23 mg/dL 21  21  27    Creatinine 0.61 - 1.24 mg/dL  0.96  0.45   Sodium 135 - 145 mmol/L 139  137  134   Potassium 3.5 - 5.1 mmol/L 4.3  4.2  4.4   Chloride 98 - 111 mmol/L 103  103  101   CO2 22 - 32 mmol/L 24  26  26    Calcium 8.9 - 10.3 mg/dL 9.2  9.1  8.9     12. Acute on chronic anemia:     7/3 stable    Latest Ref Rng & Units 08/20/2021    6:24 AM 08/13/2021    6:12 AM 08/10/2021    5:11 AM  CBC  WBC 4.0 - 10.5 K/uL 5.5  6.3  8.8   Hemoglobin 13.0 - 17.0 g/dL 9.5  9.7  9.7   Hematocrit 39.0 - 52.0 % 29.6  30.1  29.2   Platelets 150 - 400 K/uL 274  232  200     13. Urinary urgency with incontinence-    -is now continent      7/9 Voids fine with urinal    LOS: 17 days A FACE TO FACE EVALUATION WAS PERFORMED  08/12/2021 08/26/2021, 11:17 AM

## 2021-08-26 NOTE — Progress Notes (Signed)
Physical Therapy Session Note  Patient Details  Name: Shawn Decker MRN: [id number] Date of Birth: Mar 03, 1945  Today's Date: 08/26/2021 PT Individual Time: 1345-1415 PT Individual Time Calculation (min): 30 min   Short Term Goals: Week 3:  PT Short Term Goal 1 (Week 3): =LTG due to ELOS  Skilled Therapeutic Interventions/Progress Updates:    Pt received seated in w/c in room, agreeable to PT session. Pt reports he had some soreness in his L hip earlier this date, used short acting hot pack and had relief of pain. No other complaints of pain during session. Sit to stand and transfers with RW at [facility] level during session. Ambulation x 150 ft, x 100 ft with RW and CGA for balance. Ongoing flexed trunk posture and ataxic BLE. Pt reports BLE feel "mushy" with onset of fatigue, no buckling noted. Ascend/descend 8 x 6" stairs with 2 handrails and min A with step-to gait pattern, ascending with RLE and descending with LLE. Standing alt L/R 6" step-ups with 2 handrails and min A for balance for LE strengthening, 2 x 5 reps. Manual w/c propulsion x 150 ft with use of BUE at Supervision level. Pt left seated in w/c in room with needs in reach at end of session.  Therapy Documentation Precautions:  Precautions Precautions: Cervical Precaution Booklet Issued: No Precaution Comments: verbally reviewed 3/3 cervical precautions Required Braces or Orthoses: Cervical Brace Cervical Brace: Hard collar, At all times Restrictions Weight Bearing Restrictions: No      Therapy/Group: Individual Therapy   Peter Congo, PT, DPT, CSRS 08/26/2021, 3:11 PM

## 2021-08-26 NOTE — Progress Notes (Signed)
Occupational Therapy Session Note  Patient Details  Name: Shawn Decker MRN: [id number] Date of Birth: February 01, 1946  Today's Date: 08/26/2021 OT Individual Time: 0900-1015 OT Individual Time Calculation (min): 75 min    Short Term Goals: Week 2:  OT Short Term Goal 1 (Week 2): Pt will complete UB bathing and dressing with set up and CGA seated OT Short Term Goal 1 - Progress (Week 2): Met OT Short Term Goal 2 (Week 2): Pt will complete simple grooming task in standing wiht seated rests sink side after set up with min A OT Short Term Goal 2 - Progress (Week 2): Met OT Short Term Goal 3 (Week 2): Pt will complete toileting tasks with mod A (2/3 tasks) OT Short Term Goal 3 - Progress (Week 2): Met OT Short Term Goal 4 (Week 2): Pt will complete LB dressing tasks with min A OT Short Term Goal 4 - Progress (Week 2): Met  Skilled Therapeutic Interventions/Progress Updates:    Upon OT arrival, pt semi recumbent in bed and was agreeable to OT treatment session. Pt reports no pain. Pt completes sponge bath ADL at the levels below. Pt requires CGA for functional transfers and functional mobility with RW. Pt uses adaptive equipment for LB dressing. Pt making progress towards stated OT goals and continues to benefit from OT services to achieve highest level of independence. Pt limited primarily by decreased balance. Pt was left in w/c at end of session with all needs met.   Therapy Documentation Precautions:  Precautions Precautions: Cervical Precaution Booklet Issued: No Precaution Comments: verbally reviewed 3/3 cervical precautions Required Braces or Orthoses: Cervical Brace Cervical Brace: Hard collar, At all times Restrictions Weight Bearing Restrictions: No  ADL: Where Assessed-Eating: Bed level Grooming: Modified independent Where Assessed-Grooming: Sitting at sink Upper Body Bathing: Supervision/safety Where Assessed-Upper Body Bathing: Wheelchair Lower Body Bathing: Contact  guard Where Assessed-Lower Body Bathing: Wheelchair Upper Body Dressing: Supervision/safety Where Assessed-Upper Body Dressing: Sitting at sink Lower Body Dressing: Minimal assistance Where Assessed-Lower Body Dressing: Wheelchair Toileting: Contact guard Where Assessed-Toileting: Glass blower/designer: Therapist, music Method: Counselling psychologist: Grab bars, Extra wide drop arm bedside commode    Therapy/Group: Individual Therapy  Shawn Decker 08/26/2021, 10:06 AM

## 2021-08-27 LAB — CBC
HCT: 29.2 % — ABNORMAL LOW (ref 39.0–52.0)
Hemoglobin: 9.4 g/dL — ABNORMAL LOW (ref 13.0–17.0)
MCH: 27.2 pg (ref 26.0–34.0)
MCHC: 32.2 g/dL (ref 30.0–36.0)
MCV: 84.6 fL (ref 80.0–100.0)
Platelets: 255 10*3/uL (ref 150–400)
RBC: 3.45 MIL/uL — ABNORMAL LOW (ref 4.22–5.81)
RDW: 14.6 % (ref 11.5–15.5)
WBC: 4.4 10*3/uL (ref 4.0–10.5)
nRBC: 0 % (ref 0.0–0.2)

## 2021-08-27 LAB — GLUCOSE, CAPILLARY
Glucose-Capillary: 91 mg/dL (ref 70–99)
Glucose-Capillary: 111 mg/dL — ABNORMAL HIGH (ref 70–99)
Glucose-Capillary: 107 mg/dL — ABNORMAL HIGH (ref 70–99)
Glucose-Capillary: 115 mg/dL — ABNORMAL HIGH (ref 70–99)

## 2021-08-27 LAB — BASIC METABOLIC PANEL
Anion gap: 10 (ref 5–15)
BUN: 20 mg/dL (ref 8–23)
CO2: 24 mmol/L (ref 22–32)
Calcium: 8.7 mg/dL — ABNORMAL LOW (ref 8.9–10.3)
Chloride: 101 mmol/L (ref 98–111)
Creatinine, Ser: 1.46 mg/dL — ABNORMAL HIGH (ref 0.61–1.24)
GFR, Estimated: 50 mL/min — ABNORMAL LOW (ref >60)
Glucose, Bld: 90 mg/dL (ref 70–99)
Potassium: 4.2 mmol/L (ref 3.5–5.1)
Sodium: 135 mmol/L (ref 135–145)

## 2021-08-27 NOTE — Progress Notes (Signed)
Occupational Therapy Session Note  Patient Details  Name: Shawn Decker MRN: [id number] Date of Birth: 29-Apr-1945  Today's Date: 08/27/2021 OT Individual Time: 7824-2353, 6144-3154  OT Individual Time Calculation (min): 75 min & 75 min   Short Term Goals: Week 3:  OT Short Term Goal 1 (Week 3): STG=LTG 2/2 ELOS (continue working towards supervision goals)  Skilled Therapeutic Interventions/Progress Updates:   Session 1: Upon OT arrival, pt semi recumbent in bed. Pt reports no pain and is agreeable to OT treatment session. Treatment intervention with a focus on self care retraining, strengthening, endurance, and dynamic standing balance during IADLs. Pt completes supine to sit transfer with SBA and completes LB dressing at the levels below. Pt completes sit to stand transfer with CGA and ambulates with RW to sink to complete oral care at the levels below. Pt completes stand to sit transfer with CGA and was transported to rehab apartment via w/c and total A for time management. Pt engages in item retrieval, retrieving items from refrigerator with RW and CGA and transports them to countertop and then to fruit bowl. Pt able to complete task without requiring seated rest break but does demo with longer periods of exertion. Pt demos safe walker placement during task. Pt completes stand to sit transfer with RW and CGA. Pt was transported to ortho gym via w/c and total A to complete arm bike for 10.5 minutes without rest break. Pt propelled himself towards his room with 2 rest breaks and Supervision ~150' and was transported the remainder of the way to his room secondary to UE fatigue. Pt was left in w/c at end of session with all needs met.   Session 2: Upon OT arrival, pt in bathroom finishing toileting with nurse tech present. Pt agreeable to OT treatment session and reports no pain. Pt was transported to therapy gym via w/c and total A for time management. Intervention with a focus on standing tolerance,  dynamic standing balance, item retrieval, and strengthening. Pt completes sit to stand transfer with CGA and stands at tabletop to fold laundry items using B UE. Pt folds total of 14 items with CGA and requires no rest break to complete. Pt performs stand to sit transfer with CGA. Pt engages in bean bag toss for 2 x 12 reps reaching in all planes with B UE to retrieve bean bag and toss onto cornhole board using the R UE. Pt completes with CGA and had no LOB. Pt requires seated rest break in between trials. Pt requires CGA for stand to sit transfer. Pt completes sit to stand with CGA and ambulates to bean bags to retrieve with reacher requiring Min A-CGA. Pt with 1 LOB requiring assist to correct. Pt retrieves and places all bean bags into basket with min difficulty. Pt ambulates back to chair with CGA using RW and completes stand to sit transfer with CGA. While seated at tabletop, pt places and removes graded clips with B UE demonstrating no difficulty. Pt completes at a steady pace. Pt propels himself back to his room via w/c and Mod I and was left in w/c at end of session with all needs met. Pt progressing towards stated OT goals and continues to benefit from OT services to achieve highest level of independence.   Therapy Documentation Precautions:  Precautions Precautions: Cervical Precaution Booklet Issued: No Precaution Comments: verbally reviewed 3/3 cervical precautions Required Braces or Orthoses: Cervical Brace Cervical Brace: Hard collar, At all times Restrictions Weight Bearing Restrictions: No   ADL:  Grooming: Supervision/safety Where Assessed-Grooming: Standing at sink Lower Body Dressing: Contact guard Where Assessed-Lower Body Dressing: Edge of bed    Therapy/Group: Individual Therapy  Marvetta Gibbons 08/27/2021, 9:35 AM

## 2021-08-27 NOTE — Progress Notes (Signed)
PROGRESS NOTE   Subjective/Complaints:  Pt reports LBM yesterday- ate 90+% of tray-  No issues  ROS:   Pt denies SOB, abd pain, CP, N/V/C/D, and vision changes  Objective:   No results found. Recent Labs    08/27/21 0625  WBC 4.4  HGB 9.4*  HCT 29.2*  PLT 255     Recent Labs    08/27/21 0625  NA 135  K 4.2  CL 101  CO2 24  GLUCOSE 90  BUN 20  CREATININE 1.46*  CALCIUM 8.7*     Intake/Output Summary (Last 24 hours) at 08/27/2021 1851 Last data filed at 08/27/2021 1801 Gross per 24 hour  Intake 660 ml  Output 700 ml  Net -40 ml        Physical Exam: Vital Signs Blood pressure 120/60, pulse 76, temperature 98.6 F (37 C), temperature source Oral, resp. rate 18, height 5\' 8"  (1.727 m), weight 77.5 kg, SpO2 99 %.    General: awake, alert, appropriate, sitting up in bed; NAD HENT: conjugate gaze; oropharynx moist-wearing cervical collar CV: regular rate; no JVD Pulmonary: CTA B/L; no W/R/R- good air movement GI: soft, NT, ND, (+)BS Psychiatric: appropriate/interactive Neurological: poor memory, but amenable to discussion Musculoskeletal:     Cervical back: Neck supple. No tenderness.     Comments: RUE strength deltoid 5-/5; biceps 5/5; triceps 5-/5; WE 5-/5; Grip 5/5; LUE 4/5 Delt , bi tri grip LE strength HF 4/5; KE/KF 4+/5; DF 4-/5 and PF 4+/5 B/L  B/L no effusions of B/L knees  Skin:    General: Skin is warm and dry.     Comments: Multiple healed abrasions bilateral shins, neck incision CDI  Neurological:     Alert and oriented x 3. Normal insight and awareness. Intact Memory. Normal language and speech. Cranial nerve exam unremarkable     Comments: Speech clear today, no throat clearing noticed, but has on prior exams had issues with needing to clear throat multiple times. Decreased to light touch in tips of fingers and toes- but not in dermatomal distribution B/L --no change Absent DTRs in  LE's B/L --no change, tested again today, still absent DTR's BLE.  Assessment/Plan: 1. Functional deficits which require 3+ hours per day of interdisciplinary therapy in a comprehensive inpatient rehab setting. Physiatrist is providing close team supervision and 24 hour management of active medical problems listed below. Physiatrist and rehab team continue to assess barriers to discharge/monitor patient progress toward functional and medical goals  Care Tool:  Bathing    Body parts bathed by patient: Right arm, Left arm, Chest, Abdomen, Front perineal area, Right upper leg, Left upper leg, Face, Right lower leg, Left lower leg, Buttocks   Body parts bathed by helper: Buttocks, Right lower leg, Left lower leg     Bathing assist Assist Level: Contact Guard/Touching assist     Upper Body Dressing/Undressing Upper body dressing   What is the patient wearing?: Pull over shirt, Orthosis Orthosis activity level: Performed by helper  Upper body assist Assist Level: Supervision/Verbal cueing    Lower Body Dressing/Undressing Lower body dressing      What is the patient wearing?: Pants, Underwear/pull up  Lower body assist Assist for lower body dressing: Contact Guard/Touching assist     Toileting Toileting    Toileting assist Assist for toileting: Contact Guard/Touching assist     Transfers Chair/bed transfer  Transfers assist     Chair/bed transfer assist level: Contact Guard/Touching assist     Locomotion Ambulation   Ambulation assist      Assist level: Contact Guard/Touching assist Assistive device: Walker-rolling Max distance: 200'   Walk 10 feet activity   Assist  Walk 10 feet activity did not occur: Safety/medical concerns  Assist level: Contact Guard/Touching assist Assistive device: Walker-rolling   Walk 50 feet activity   Assist Walk 50 feet with 2 turns activity did not occur: Safety/medical concerns  Assist level: Contact  Guard/Touching assist Assistive device: Walker-rolling    Walk 150 feet activity   Assist Walk 150 feet activity did not occur: Safety/medical concerns  Assist level: Contact Guard/Touching assist Assistive device: Walker-rolling    Walk 10 feet on uneven surface  activity   Assist Walk 10 feet on uneven surfaces activity did not occur: Safety/medical concerns         Wheelchair     Assist Is the patient using a wheelchair?: Yes Type of Wheelchair: Manual    Wheelchair assist level: Supervision/Verbal cueing Max wheelchair distance: 100'    Wheelchair 50 feet with 2 turns activity    Assist        Assist Level: Supervision/Verbal cueing   Wheelchair 150 feet activity     Assist      Assist Level: Dependent - Patient 0%   Blood pressure 120/60, pulse 76, temperature 98.6 F (37 C), temperature source Oral, resp. rate 18, height 5\' 8"  (1.727 m), weight 77.5 kg, SpO2 99 %.  Medical Problem List and Plan: 1. 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  Antithrombotics: -DVT/anticoagulation:  Mechanical: Sequential compression devices, below knee Bilateral lower extremities  6/23- should be able to start Lovenox over weekend- started 6/24, monitor neuro status and hgb 7/3 hgb stable at 9.5             -antiplatelet therapy: N/A 3. Pain Management: Oxycodone prn. Gabapentin 300 mg bid for neuropathy. Pain adequate with pain meds  7/10- pain tolerable on less meds- con't regimen 4. Mood/Sleep: LCSW to follow for evaluation and assist. Has been sleeping well.              -antipsychotic agents:  N/A 5. Neuropsych/cognition: This patient is capable of making  decisions on his own behalf. 6. Skin/Wound Care: Monitor incision for healing.   6/29- asked nursing to change dressing  7/7- staples out- almost completely healed 7. Fluids/Electrolytes/Nutrition: Monitor I/O. Check CMET in am.  8. Cervical myelopathy s/p decompression C2/3 and C5/6:  --Collar to be on at all times.              --dysphagia?  SLP for swallow evaluation.  9. HTN: Monitor BP TID. Continue Midamor, normodyne and amlodipine   6/26- BP elevated in 150s-160s systolic- if doesn't improve in next few  days, (maxxed out on current BP meds), will check about other options, esp due to renal issues.   7/4- BP really elevated this AM- [id number] systolic- this is a big spike for him; if stays up, will add BP med. 7/10- BP reasonable control0 con't regimen Vitals:   08/27/21 0317 08/27/21 1309  BP: 137/69 120/60  Pulse: 70 76  Resp: 18 18  Temp: 98.4 F (36.9 C) 98.6 F (37 C)  SpO2: 98% 99%    10. RA: On [facility]rav[facility] per Dr. Abner Greenspan in [place].  11. CKD 3 a:  BUN/SCr- 27/1.75 at admission-->improved since Merna --monitor with serial checks. Avoid nephrotoxic medications. 6/23- BUN 34 up from 22 and Cr 1.53- not sure exact baseline, but was 1.39 a few days ago- will give IVFs NS 75cc/hour x 24 hours and recheck in AM- think drinking impaired by swallowing issues.   6/27- will check BMP in AM  6/28- Cr 1.31 and NUN 24 down from 35- wil d/c IV  6/30- Cr 1.71- BUN up to 32- not sure if pt isn't drinking vs this his baseline? Will push fluids and recheck in AM  7/1- BUN 27 and Cr 1.55- down from 52 and 1.71- will recheck Monday  7/3 BUN improved today---continue to push fluids.. 7/7- doing great with fluids- Cr down to 1.35 and BUN 21- which is at baseline now-  7/9 Continue to push fluids and trend with weekly labs 7/10- Cr 1.46- and BUN 20- in the realm of his baseline- will con't to monitor clinically    Latest Ref Rng & Units 08/27/2021    6:25 AM 08/24/2021    5:20 AM 08/20/2021    6:24 AM   BMP  Glucose 70 - 99 mg/dL 90  93  97   BUN 8 - 23 mg/dL 20  21  21    Creatinine 0.61 - 1.24 mg/dL 1.46  1.35  1.51   Sodium 135 - 145 mmol/L 135  139  137   Potassium 3.5 - 5.1 mmol/L 4.2  4.3  4.2   Chloride 98 - 111 mmol/L 101  103  103   CO2 22 - 32 mmol/L 24  24  26    Calcium 8.9 - 10.3 mg/dL 8.7  9.2  9.1     12. Acute on chronic anemia:     7/3 stable    Latest Ref Rng & Units 08/27/2021    6:25 AM 08/20/2021    6:24 AM 08/13/2021    6:12 AM  CBC  WBC 4.0 - 10.5 K/uL 4.4  5.5  6.3   Hemoglobin 13.0 - 17.0 g/dL 9.4  9.5  9.7   Hematocrit 39.0 - 52.0 % 29.2  29.6  30.1   Platelets 150 - 400 K/uL 255  274  232     13. Urinary urgency with incontinence-    -is now continent      7/9 Voids fine with urinal    LOS: 18 days A FACE TO FACE EVALUATION WAS PERFORMED  Jiali Linney 08/27/2021, 6:51 PM

## 2021-08-27 NOTE — Progress Notes (Signed)
Physical Therapy Session Note  Patient Details  Name: Shawn Decker MRN: [id number] Date of Birth: [birth year]  Today's Date: 08/27/2021 PT Individual Time: 1100-1200 PT Individual Time Calculation (min): 60 min   Short Term Goals: Week 3:  PT Short Term Goal 1 (Week 3): =LTG due to ELOS  Skilled Therapeutic Interventions/Progress Updates:    Pt received seated in recliner in room, agreeable to PT session. No complaints of pain. Sit to stand and transfers with RW and CGA during session. Ambulation x 200 ft, x 150 ft with RW and CGA, focus on upright posture and widening BOS during gait. Sit to/from supine on real bed in therapy apartment with Supervision to simulate home environment. Sidesteps L/R in // bars 3 x 10 ft each direction, x 2 reps with close Supervision for balance for hip strengthening. Standing 4" step-downs, 2 x 10 reps with each LE in // bars with CGA for balance for quad strengthening and work on eccentric control. Pt exhibits decreased control of LLE as compared to RLE. Standing alt L/R 6" step-ups in // bars with CGA for balance, x 10 reps each. Pt left seated in w/c in room with needs in reach at end of session.  Therapy Documentation Precautions:  Precautions Precautions: Cervical Precaution Booklet Issued: No Precaution Comments: verbally reviewed 3/3 cervical precautions Required Braces or Orthoses: Cervical Brace Cervical Brace: Hard collar, At all times Restrictions Weight Bearing Restrictions: No      Therapy/Group: Individual Therapy   Peter Congo, PT, DPT, CSRS 08/27/2021, 12:38 PM

## 2021-08-27 NOTE — Progress Notes (Signed)
Occupational Therapy Session Note  Patient Details  Name: Shawn Decker MRN: [id number] Date of Birth: September 28, 1945  Today's Date: 08/27/2021 OT Individual Time: 1015-1045 OT Individual Time Calculation (min): 30 min    Short Term Goals: Week 3:  OT Short Term Goal 1 (Week 3): STG=LTG 2/2 ELOS (continue working towards supervision goals)  Skilled Therapeutic Interventions/Progress Updates:    Patient agreeable to participate in OT session. Reports 0/10 pain level.   Patient participated in skilled OT session focusing on toileting, toilet transfer, and sensory re-education to bilateral hands. Therapist facilitated session with patient completing toilet transfer with CGA using grab bar and BSC over toilet. Provided supplies to complete hygiene while seated. Therapist donned incontinence brief with total assist while patient managed pants with CGA.   Sensory re-education  completed with bilateral hands while utilizing vibration with each hand placed on playground ball to conduct vibration to each hand minimizing direct contact due to amount of bony prominences. Education provided during activity regarding technique and benefit of vibration for sensory re-education.  High resistance sponges used for in hand manipulation and hand strengthening activity:  Left hand: 11, 14 Right hand: 12, 13  Therapy Documentation Precautions:  Precautions Precautions: Cervical Precaution Booklet Issued: No Precaution Comments: verbally reviewed 3/3 cervical precautions Required Braces or Orthoses: Cervical Brace Cervical Brace: Hard collar, At all times Restrictions Weight Bearing Restrictions: No  Therapy/Group: Individual Therapy  Limmie Patricia, OTR/L,CBIS  Supplemental OT - [facility]  08/27/2021, 7:54 AM

## 2021-08-28 LAB — GLUCOSE, CAPILLARY
Glucose-Capillary: 86 mg/dL (ref 70–99)
Glucose-Capillary: 113 mg/dL — ABNORMAL HIGH (ref 70–99)
Glucose-Capillary: 139 mg/dL — ABNORMAL HIGH (ref 70–99)
Glucose-Capillary: 99 mg/dL (ref 70–99)

## 2021-08-28 NOTE — Progress Notes (Signed)
Physical Therapy Session Note  Patient Details  Name: Shawn Decker MRN: [id number] Date of Birth: 1945-10-17  Today's Date: 08/28/2021 PT Individual Time: 8916-9450 PT Individual Time Calculation (min): 70 min   Short Term Goals: Week 3:  PT Short Term Goal 1 (Week 3): =LTG due to ELOS  Skilled Therapeutic Interventions/Progress Updates:    Pt received seated in w/c in room, agreeable to PT session. Pt reports 7/10 pain in L lower back/upper hip region. Pt utilizing hot pack for pain management. Nursing also able to provide pain medication during session. Sit to stand and stand pivot transfer w/c to mat table with CGA. Sit to supine to sidelying with Supervision. Sidelying STM and TPR to L QL and piriformis to assist with pain. Supine SKTC and piriformis stretch 3 x 30-60 sec each. Pt able to come into long-sitting position with min A for trunk elevation. Long-sitting BLE HS stretch 3 x 60 sec and LLE piriformis stretch x 60 sec. Will provide handout for patient for self-stretching program. Hot pack applied after manual therapy and stretching to assist with pain management and muscle relaxation. Pt returned to sitting EOM with Supervision. Standing mini-squats 3 x 10 reps with RW and CGA for balance for LE strengthening. Ambulation x 150 ft, x 200 ft with RW and close Supervision to CGA for balance, improved upright posture this date. Manual w/c propulsion x 100 ft with use of BUE at Supervision level before onset of fatigue. Ascend/descend 8 x 6" stairs with 2 handrails and CGA, step-to gait pattern. Pt left seated in w/c in room with needs in reach at end of session. Removed short-acting hot pack and no adverse skin reactions noted.  Therapy Documentation Precautions:  Precautions Precautions: Cervical Precaution Booklet Issued: No Precaution Comments: verbally reviewed 3/3 cervical precautions Required Braces or Orthoses: Cervical Brace Cervical Brace: Hard collar, At all  times Restrictions Weight Bearing Restrictions: No       Therapy/Group: Individual Therapy   Peter Congo, PT, DPT, CSRS 08/28/2021, 12:06 PM

## 2021-08-28 NOTE — Patient Care Conference (Signed)
Inpatient RehabilitationTeam Conference and Plan of Care Update Date: 08/28/2021   Time: 11:10 AM    Patient Name: Shawn Decker      Medical Record Number: [id number]  Date of Birth: August 02, 1945 Sex: Male         Room/Bed: 4W05C/4W05C-01 Payor Info: Payor: [employer] MEDICARE / Plan: [employer] MEDICARE HMO/PPO / Product Type: *No Product type* /    Admit Date/Time:  08/09/2021  2:55 PM  Primary Diagnosis:  Cervical myelopathy [facility])  Hospital Problems: Principal Problem:   Cervical myelopathy [facility]) Active Problems:   Dysphagia    Expected Discharge Date: Expected Discharge Date: 08/31/21  Team Members Present: Physician leading conference: Dr. Genice Rouge Social Worker Present: Cecile Sheerer, LCSWA Nurse Present: Kennyth Arnold, RN PT Present: Peter Congo, PT OT Present: Ardis Rowan, Carlis Abbott, OT SLP Present: Gerda Diss, SLP PPS Coordinator present : Fae Pippin, SLP     Current Status/Progress Goal Weekly Team Focus  Bowel/Bladder   continent b/b  remain continent  toilet as needed   Swallow/Nutrition/ Hydration             ADL's   bating-CGA; LB dressing-CGA/min A; transfers-supervision/CGA; toileting-CGA  supervsion/CGA overall  tranfsers, toileting, safety awareness, activity tolerance   Mobility   Supervision bed mobility, CGA transfers and gait with RW up to 200 ft, CGA to min A stairs with 2 handrails  upgraded to Supervision overall, CGA stairs  d/c planning, LE NMR and strengthening, gait, stairs   Communication             Safety/Cognition/ Behavioral Observations            Pain   no pain reported  < 3  assess pain q 4 hr and prn   Skin   CDI  no new breakdown  assess skin q shift and prn     Discharge Planning:  Pt to d/c to home with his son [name] providing support.Fam edu completed on Wednesday (7/5) 1pm-4pm with pt son [name]. Will discuss [facility] preference, and determine if P[facility] referral is appropriate for submission based on his care  needs.   Team Discussion: Blood pressure better. Cr at baseline. Continent B/B, no pain reported. CBG's good, compliant with aspen collar. Discharging home with son. Family managing need for equipment.   Patient on target to meet rehab goals: yes, supervision goals. On target to meet goals.   *See Care Plan and progress notes for long and short-term goals.   Revisions to Treatment Plan:  None at this time   Teaching Needs: Family education, medication management, transfer/gait training, etc.   Current Barriers to Discharge: No current barriers  Possible Resolutions to Barriers: All barriers addressed     Medical Summary Current Status: pain tolerable with meds- less apinful than was- B/B continent- CBGs good- compliant with collar;  Barriers to Discharge: Decreased family/caregiver support;Home enviroment access/layout  Barriers to Discharge Comments: more appropriate for Outpt therapies now- has supervision-CGA goals not mod I Possible Resolutions to [employer] Focus: family done family education; barriers- cannot reach mod I- has RW and family got w/c- family getting shower seat and has BSC- d/c 7/14   Continued Need for Acute Rehabilitation Level of Care: The patient requires daily medical management by a physician with specialized training in physical medicine and rehabilitation for the following reasons: Direction of a multidisciplinary physical rehabilitation program to maximize functional independence : Yes Medical management of patient stability for increased activity during participation in an intensive rehabilitation regime.: Yes Analysis  of laboratory values and/or radiology reports with any subsequent need for medication adjustment and/or medical intervention. : Yes   I attest that I was present, lead the team conference, and concur with the assessment and plan of the team.   Tennis Must 08/28/2021, 2:08 PM

## 2021-08-28 NOTE — Progress Notes (Signed)
Physical Therapy Session Note  Patient Details  Name: Shawn Decker MRN: [id number] Date of Birth: [birth year]  Today's Date: 08/28/2021 PT Individual Time: 9169-4503 PT Individual Time Calculation (min): 72 min   Short Term Goals: Week 3:  PT Short Term Goal 1 (Week 3): =LTG due to ELOS  Skilled Therapeutic Interventions/Progress Updates:     Pt received seated in [facility] and agrees to therapy. Reports pain in neck and L hip. RN aware. PT provides rest breaks and mobility to manage pain. Pt performs WC propulsion with bilateral upper extremities to dayroom, x100', with cues for posture to optimize body mechanics. Sit to stand with RW and CGA. Pt ambulates x175' with RW and CGA, with cues for upright gaze to improve posture and balance, and increasing eccentric control of L lower extremity plantarflexion during loading phase of gait cycle. Following brief seated rest break, pt ambulates to Nustep with RW and cues for positioning. Pt completes Nustep for strength and endurance training. Pt completes 20:00 at workload of 5 with average steps per minute >50.  Pt practices gait without AD to work on balance, strength, and motor control. Pt ambulates x25' without AD, with minA at hips for stability and cues for increased engagement of L lower extremity extensor muscle groups to stabilize knee and hip in stance phase. Pt takes seated rest break prior to ambulating additional x40' with same assist, including performing 180 degree turn. Following rest break, pt completes x60' including x2 turns.  Pt performs block practice of sit to stand transfer to work on strengthening and balance training. PT demonstrates transfer without use of hands and also with upper extremity support on thighs. Pt initially performs pushing on thighs with CGA, then crosses arms over chest and sits down slowly to work on eccentric control. Pt performs x4 more reps consecutively of sit to stand without hands and sitting slowly. Following  seated rest break, pt performs x6 with CGA, then ambulates x35' back to RW with minA and no AD.  Pt propels WB back to room with bilateral upper extremities. Left seated with all needs within reach.  Therapy Documentation Precautions:  Precautions Precautions: Cervical Precaution Booklet Issued: No Precaution Comments: verbally reviewed 3/3 cervical precautions Required Braces or Orthoses: Cervical Brace Cervical Brace: Hard collar, At all times Restrictions Weight Bearing Restrictions: No     Therapy/Group: Individual Therapy  Beau Fanny, PT, DPT 08/28/2021, 5:33 PM

## 2021-08-28 NOTE — Progress Notes (Signed)
Occupational Therapy Session Note  Patient Details  Name: Shawn Decker MRN: [id number] Date of Birth: 08/28/1945  Today's Date: 08/28/2021 OT Individual Time: 1121-6244 OT Individual Time Calculation (min): 71 min    Short Term Goals: Week 2:  OT Short Term Goal 1 (Week 2): Pt will complete UB bathing and dressing with set up and CGA seated OT Short Term Goal 1 - Progress (Week 2): Met OT Short Term Goal 2 (Week 2): Pt will complete simple grooming task in standing wiht seated rests sink side after set up with min A OT Short Term Goal 2 - Progress (Week 2): Met OT Short Term Goal 3 (Week 2): Pt will complete toileting tasks with mod A (2/3 tasks) OT Short Term Goal 3 - Progress (Week 2): Met OT Short Term Goal 4 (Week 2): Pt will complete LB dressing tasks with min A OT Short Term Goal 4 - Progress (Week 2): Met  Skilled Therapeutic Interventions/Progress Updates:    Pt resting in bed upon arrival. OT intervention with focus on bed mobility, sit<>stand, standing balance, functional transfers, BADL retraining, discharge planning, and safety awareness to increase independence with BADLs. Supine>sit EOB with supervision using bed rails. Pt threaded BLE into pants using reacher. Sit<>stand with CGA. Standing balance with CGA as pt pulled pants over hips. See below for pain. Disucssed use of Depends/pull-ups when discharged home. All functional transfers with CGA. Pt somewhat limited by pain in Lt lower back. Pt completed UB bathing/dressing tasks seated in w/c at sink. Pt remained seated in w/c with all needs within reach. Heat pack applied to Lt lower back.   Therapy Documentation Precautions:  Precautions Precautions: Cervical Precaution Booklet Issued: No Precaution Comments: verbally reviewed 3/3 cervical precautions Required Braces or Orthoses: Cervical Brace Cervical Brace: Hard collar, At all times Restrictions Weight Bearing Restrictions: No Pain:  Pt reported 5/10 neck pain at  beginning of session. While dressing pt reported Lt lower back pain relieved with manual therapy and heat   Therapy/Group: Individual Therapy  Leroy Libman 08/28/2021, 8:12 AM

## 2021-08-28 NOTE — Progress Notes (Signed)
Patient ID: Shawn Decker, male   DOB: 1945-05-23, 76 y.o.   MRN: [id number]  SW met with pt in room to provide updates from team conference, and called pt son [name] while in room. SW informed d/c date remains 7/14. SW shared pt now appropriate for outpatient therapies. SW will send referral to [facility] in [place] per pt request. Family confirms DME purchased: shower chair and has RW and BSC. SW reviewed discharge process.   Loralee Pacas, MSW, [place] Office: [phone] Cell: [phone] Fax: [phone]

## 2021-08-28 NOTE — Progress Notes (Signed)
Physical Therapy Session Note  Patient Details  Name: Shawn Decker MRN: [id number] Date of Birth: 10/21/1945  {CHL IP REHAB PT TIME CALCULATION:304800500}  Short Term Goals: {PJK:9326712}  Skilled Therapeutic Interventions/Progress Updates:      Access Code: [email or web address] URL: [email or web address] Date: 08/28/2021 Prepared by: Peter Congo  Exercises - Long Sitting Calf Stretch with Strap  - 1 x daily - 7 x weekly - 1 sets - 5 reps - 60 hold - Seated Piriformis Stretch with Trunk Bend  - 1 x daily - 7 x weekly - 1 sets - 5 reps - 60 hold - Mini Squat with Counter Support  - 1 x daily - 7 x weekly - 3 sets - 10 reps - Side Stepping with Counter Support  - 1 x daily - 7 x weekly - 3 sets - 10 reps - Step Up  - 1 x daily - 7 x weekly - 3 sets - 10 reps - Forward Step Down  - 1 x daily - 7 x weekly - 3 sets - 10 reps  Therapy Documentation Precautions:  Precautions Precautions: Cervical Precaution Booklet Issued: No Precaution Comments: verbally reviewed 3/3 cervical precautions Required Braces or Orthoses: Cervical Brace Cervical Brace: Hard collar, At all times Restrictions Weight Bearing Restrictions: No General:   Vital Signs: Therapy Vitals Temp: 98.3 F (36.8 C) Temp Source: Oral Pulse Rate: 77 Resp: 18 BP: (!) 109/59 Patient Position (if appropriate): Standing Oxygen Therapy SpO2: 100 % O2 Device: Room Air Pain:   Mobility:   Locomotion :    Trunk/Postural Assessment :    Balance:   Exercises:   Other Treatments:      Therapy/Group: {Therapy/Group:3049007}  Peter Congo 08/28/2021, 3:15 PM

## 2021-08-28 NOTE — Progress Notes (Signed)
PROGRESS NOTE   Subjective/Complaints:   Pt reports doing well- LBM yesterday.  Admits getting really tired with standing while OTA gets his depends in place- working on endurance.    ROS:   Pt denies SOB, abd pain, CP, N/V/C/D, and vision changes  Objective:   No results found. Recent Labs    08/27/21 0625  WBC 4.4  HGB 9.4*  HCT 29.2*  PLT 255     Recent Labs    08/27/21 0625  NA 135  K 4.2  CL 101  CO2 24  GLUCOSE 90  BUN 20  CREATININE 1.46*  CALCIUM 8.7*     Intake/Output Summary (Last 24 hours) at 08/28/2021 0844 Last data filed at 08/28/2021 0718 Gross per 24 hour  Intake 660 ml  Output 800 ml  Net -140 ml        Physical Exam: Vital Signs Blood pressure 131/68, pulse 72, temperature 98.2 F (36.8 C), resp. rate 18, height 5\' 8"  (1.727 m), weight 77.5 kg, SpO2 100 %.     General: awake, alert, appropriate, standing getting depends on; NAD HENT: conjugate gaze; oropharynx moist-cervical collar in place CV: somewhat tachycardic rate in 110s-120s from standing resolved with sitting; no JVD Pulmonary: CTA B/L; no W/R/R- good air movement GI: soft, NT, ND, (+)BS Psychiatric: appropriate-interactive; joking some Neurological: fair memory- alert Musculoskeletal:     Cervical back: Neck supple. No tenderness.     Comments: RUE strength deltoid 5-/5; biceps 5/5; triceps 5-/5; WE 5-/5; Grip 5/5; LUE 4/5 Delt , bi tri grip LE strength HF 4/5; KE/KF 4+/5; DF 4-/5 and PF 4+/5 B/L  B/L no effusions of B/L knees  Skin:    General: Skin is warm and dry.     Comments: Multiple healed abrasions bilateral shins, neck incision CDI  Neurological:     Alert and oriented x 3. Normal insight and awareness. Intact Memory. Normal language and speech. Cranial nerve exam unremarkable     Comments: Speech clear today, no throat clearing noticed, but has on prior exams had issues with needing to clear throat  multiple times. Decreased to light touch in tips of fingers and toes- but not in dermatomal distribution B/L --no change Absent DTRs in LE's B/L --no change, tested again today, still absent DTR's BLE.  Assessment/Plan: 1. Functional deficits which require 3+ hours per day of interdisciplinary therapy in a comprehensive inpatient rehab setting. Physiatrist is providing close team supervision and 24 hour management of active medical problems listed below. Physiatrist and rehab team continue to assess barriers to discharge/monitor patient progress toward functional and medical goals  Care Tool:  Bathing    Body parts bathed by patient: Right arm, Left arm, Chest, Abdomen, Front perineal area, Right upper leg, Left upper leg, Face, Right lower leg, Left lower leg, Buttocks   Body parts bathed by helper: Buttocks, Right lower leg, Left lower leg     Bathing assist Assist Level: Contact Guard/Touching assist     Upper Body Dressing/Undressing Upper body dressing   What is the patient wearing?: Pull over shirt, Orthosis Orthosis activity level: Performed by helper  Upper body assist Assist Level: Supervision/Verbal cueing  Lower Body Dressing/Undressing Lower body dressing      What is the patient wearing?: Pants     Lower body assist Assist for lower body dressing: Contact Guard/Touching assist     Toileting Toileting    Toileting assist Assist for toileting: Contact Guard/Touching assist     Transfers Chair/bed transfer  Transfers assist     Chair/bed transfer assist level: Contact Guard/Touching assist     Locomotion Ambulation   Ambulation assist      Assist level: Contact Guard/Touching assist Assistive device: Walker-rolling Max distance: 200'   Walk 10 feet activity   Assist  Walk 10 feet activity did not occur: Safety/medical concerns  Assist level: Contact Guard/Touching assist Assistive device: Walker-rolling   Walk 50 feet  activity   Assist Walk 50 feet with 2 turns activity did not occur: Safety/medical concerns  Assist level: Contact Guard/Touching assist Assistive device: Walker-rolling    Walk 150 feet activity   Assist Walk 150 feet activity did not occur: Safety/medical concerns  Assist level: Contact Guard/Touching assist Assistive device: Walker-rolling    Walk 10 feet on uneven surface  activity   Assist Walk 10 feet on uneven surfaces activity did not occur: Safety/medical concerns         Wheelchair     Assist Is the patient using a wheelchair?: Yes Type of Wheelchair: Manual    Wheelchair assist level: Supervision/Verbal cueing Max wheelchair distance: 100'    Wheelchair 50 feet with 2 turns activity    Assist        Assist Level: Supervision/Verbal cueing   Wheelchair 150 feet activity     Assist      Assist Level: Dependent - Patient 0%   Blood pressure 131/68, pulse 72, temperature 98.2 F (36.8 C), resp. rate 18, height 5\' 8"  (1.727 m), weight 77.5 kg, SpO2 100 %.  Medical Problem List and Plan: 1. 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  2.  Antithrombotics: -DVT/anticoagulation:  Mechanical: Sequential compression devices, below knee Bilateral lower extremities  6/23- should be able to start Lovenox over weekend- started 6/24, monitor neuro status and hgb 7/3 hgb stable at 9.5             -antiplatelet therapy: N/A 3. Pain Management: Oxycodone prn. Gabapentin 300 mg bid for neuropathy. Pain adequate with pain meds  7/10- pain tolerable on less meds- con't regimen 4. Mood/Sleep: LCSW to follow for  evaluation and assist. Has been sleeping well.              -antipsychotic agents:  N/A 5. Neuropsych/cognition: This patient is capable of making decisions on his own behalf. 6. Skin/Wound Care: Monitor incision for healing.   6/29- asked nursing to change dressing  7/7- staples out- almost completely healed 7. Fluids/Electrolytes/Nutrition: Monitor I/O. Check CMET in am.  8. Cervical myelopathy s/p decompression C2/3 and C5/6:  --Collar to be on at all times.              --dysphagia?  SLP for swallow evaluation.  9. HTN: Monitor BP TID. Continue Midamor, normodyne and amlodipine   6/26- BP elevated in 150s-160s systolic- if doesn't improve in next few days, (maxxed out on current BP meds), will check about other options, esp due to renal issues.   7/4- BP really elevated this AM- 150s-180s systolic- this is a big spike for him; if stays up, will add BP med. 7/11- BP reasonable control- con't regimen Vitals:   08/27/21 1935 08/28/21 0506  BP: (!) 150/71 131/68  Pulse: 74 72  Resp: 18 18  Temp: 98.2 F (36.8 C) 98.2 F (36.8 C)  SpO2: 98% 100%    10. RA: On Arava per Dr. Cardell Peach in [place].  11. CKD 3 a:  BUN/SCr- 27/1.75 at admission-->improved since admisison --monitor with serial checks. Avoid nephrotoxic medications. 6/23- BUN 34 up from 22 and Cr 1.53- not sure exact baseline, but was 1.39 a few days ago- will give IVFs NS 75cc/hour x 24 hours and recheck in AM- think drinking impaired by swallowing issues.   6/27- will check BMP in AM  6/28- Cr 1.31 and NUN 24 down from 35- wil d/c IV  6/30- Cr 1.71- BUN up to 32- not sure if pt isn't drinking vs this his baseline? Will push fluids and recheck in AM  7/1- BUN 27 and Cr 1.55- down from 52 and 1.71- will recheck Monday  7/3 BUN improved today---continue to push fluids.. 7/7- doing great with fluids- Cr down to 1.35 and BUN 21- which is at baseline now-  7/9 Continue to push fluids and trend with weekly labs 7/10- Cr 1.46- and BUN 20-  in the realm of his baseline- will con't to monitor clinically    Latest Ref Rng & Units 08/27/2021    6:25 AM 08/24/2021    5:20 AM 08/20/2021    6:24 AM  BMP  Glucose 70 - 99 mg/dL 90  93  97   BUN 8 - 23 mg/dL Creatinine 0.61 - 1.24 mg/dL 1.61  0.96  0.45   Sodium 135 - 145 mmol/L 135  139  137   Potassium 3.5 - 5.1 mmol/L 4.2  4.3  4.2   Chloride 98 - 111 mmol/L 101  103  103   CO2 22 - 32 mmol/L Calcium 8.9 - 10.3 mg/dL 8.7  9.2  9.1     12. Acute on chronic anemia:     7/3 stable    Latest Ref Rng & Units 08/27/2021    6:25 AM 08/20/2021    6:24 AM 08/13/2021    6:12 AM  CBC  WBC 4.0 - 10.5 K/uL 4.4  5.5  6.3   Hemoglobin 13.0 - 17.0 g/dL 9.4  9.5  9.7   Hematocrit 39.0 - 52.0 % 29.2  29.6  30.1   Platelets 150 - 400 K/uL 255  274  232     13. Urinary urgency with incontinence-    -is now continent      7/9 Voids fine with urinal    I spent a total of  35  minutes on total care today- >50% coordination of care- due to team conference to finalize d/c.    LOS: 19 days A FACE TO FACE EVALUATION WAS PERFORMED  Jahni Nazar 08/28/2021, 8:44 AM

## 2021-08-29 LAB — GLUCOSE, CAPILLARY
Glucose-Capillary: 92 mg/dL (ref 70–99)
Glucose-Capillary: 108 mg/dL — ABNORMAL HIGH (ref 70–99)
Glucose-Capillary: 142 mg/dL — ABNORMAL HIGH (ref 70–99)
Glucose-Capillary: 95 mg/dL (ref 70–99)

## 2021-08-29 NOTE — Progress Notes (Signed)
Occupational Therapy Session Note  Patient Details  Name: Shawn Decker MRN: [id number] Date of Birth: April 14, 1945  Today's Date: 08/29/2021 OT Individual Time: 5732-2025 OT Individual Time Calculation (min): 72 min    Short Term Goals: Week 3:  OT Short Term Goal 1 (Week 3): STG=LTG 2/2 ELOS (continue working towards supervision goals)  Skilled Therapeutic Interventions/Progress Updates:    Pt resting in bed upon arrival. OT intervention with focus on bed mobility, sitting balance, sit<>stand, standing balance, functional transfers, bathing/dressing with sit<>stand from w/c at sink, and safety awareness to increase independence with BADLs and prepare for discharge home 7/14. Supine>sit EOB with supervision (bed flat). Sitting balance and sit<>stand with supervision. Stand step pivot transfer to w/c with supervision. Pt stood at sink to wash buttocks with supervision. Pt used reacher to thread pants and assist with donning shoes. CGA for standing balance at sink to pull pants over hips. Pt able to tie draw strings on pants while standing. Pt required assistance tieing shoes. Pt states he has "slip on" shoes at home. Pt pleased with progress. Pt remained in w/c with all needs within reach.   Therapy Documentation Precautions:  Precautions Precautions: Cervical Precaution Booklet Issued: No Precaution Comments: verbally reviewed 3/3 cervical precautions Required Braces or Orthoses: Cervical Brace Cervical Brace: Hard collar, At all times Restrictions Weight Bearing Restrictions: No Pain:  Pt reports 4/10 neck pain; pt also reports his back is "feeling better"; repositioned   Therapy/Group: Individual Therapy  Rich Brave 08/29/2021, 8:15 AM

## 2021-08-29 NOTE — Progress Notes (Signed)
Physical Therapy Discharge Summary  Patient Details  Name: Shawn Decker MRN: [id number] Date of Birth: [birth year]  {CHL IP REHAB PT TIME CALCULATION:304800500}   Patient has met {NUMBERS 0-12:18577} of {NUMBERS 0-12:18577} long term goals due to {due WN:4627035}.  Patient to discharge at [facility] level {LOA:3049010}.   Patient's care partner {care partner:3041650} to provide the necessary {assistance:3041652} assistance at discharge.  Reasons goals not met: ***  Recommendation:  Patient will benefit from ongoing skilled PT services in {setting:3041680} to continue to advance safe functional mobility, address ongoing impairments in ***, and minimize fall risk.  Equipment: {equipment:3041657}  Reasons for discharge: {Reason for discharge:3049018}  Patient/family agrees with progress made and goals achieved: {Pt/Family agree with progress/goals:3049020}  PT Discharge Precautions/Restrictions   Vital Signs Therapy Vitals Temp: 98.1 F (36.7 C) Temp Source: Oral Pulse Rate: 67 Resp: 17 BP: 123/72 Patient Position (if appropriate): Lying Oxygen Therapy SpO2: 99 % O2 Device: Room Air Pain   Pain Interference   Vision/Perception     Cognition   Sensation   Motor     Mobility   Locomotion     Trunk/Postural Assessment     Balance   Extremity Assessment             Windell Moulding, PT, DPT, CSRS  08/29/2021, 7:50 AM

## 2021-08-29 NOTE — Progress Notes (Signed)
PROGRESS NOTE   Subjective/Complaints:   Pt reports LBM yesterday "Same old, same old" when asked how doing Overall doing "pretty good".  No issues  ROS:   Pt denies SOB, abd pain, CP, N/V/C/D, and vision changes  Objective:   No results found. Recent Labs    08/27/21 0625  WBC 4.4  HGB 9.4*  HCT 29.2*  PLT 255     Recent Labs    08/27/21 0625  NA 135  K 4.2  CL 101  CO2 24  GLUCOSE 90  BUN 20  CREATININE 1.46*  CALCIUM 8.7*     Intake/Output Summary (Last 24 hours) at 08/29/2021 0813 Last data filed at 08/29/2021 0805 Gross per 24 hour  Intake 1080 ml  Output 500 ml  Net 580 ml        Physical Exam: Vital Signs Blood pressure 123/72, pulse 67, temperature 98.1 F (36.7 C), temperature source Oral, resp. rate 17, height 5\' 8"  (1.727 m), weight 77.5 kg, SpO2 99 %.      General: awake, alert, appropriate, sitting up in bed; OTA just entered room;  NAD HENT: conjugate gaze; oropharynx moist-cervical collar in place CV: regular rate; no JVD Pulmonary: CTA B/L; no W/R/R- good air movement GI: soft, NT, ND, (+)BS Psychiatric: appropriate- joking today Neurological: Alert- fair to poor memory Musculoskeletal:     Cervical back: Neck supple. No tenderness.     Comments: RUE strength deltoid 5-/5; biceps 5/5; triceps 5-/5; WE 5-/5; Grip 5/5; LUE 4/5 Delt , bi tri grip LE strength HF 4/5; KE/KF 4+/5; DF 4-/5 and PF 4+/5 B/L  B/L no effusions of B/L knees  Skin:    General: Skin is warm and dry.     Comments: Multiple healed abrasions bilateral shins, neck incision CDI  Neurological:     Alert and oriented x 3. Normal insight and awareness. Intact Memory. Normal language and speech. Cranial nerve exam unremarkable     Comments: Speech clear today, no throat clearing noticed, but has on prior exams had issues with needing to clear throat multiple times. Decreased to light touch in tips of fingers  and toes- but not in dermatomal distribution B/L --no change Absent DTRs in LE's B/L --no change, tested again today, still absent DTR's BLE.  Assessment/Plan: 1. Functional deficits which require 3+ hours per day of interdisciplinary therapy in a comprehensive inpatient rehab setting. Physiatrist is providing close team supervision and 24 hour management of active medical problems listed below. Physiatrist and rehab team continue to assess barriers to discharge/monitor patient progress toward functional and medical goals  Care Tool:  Bathing    Body parts bathed by patient: Right arm, Left arm, Chest, Abdomen, Front perineal area, Right upper leg, Left upper leg, Face, Right lower leg, Left lower leg, Buttocks   Body parts bathed by helper: Buttocks, Right lower leg, Left lower leg     Bathing assist Assist Level: Contact Guard/Touching assist     Upper Body Dressing/Undressing Upper body dressing   What is the patient wearing?: Pull over shirt, Orthosis Orthosis activity level: Performed by helper  Upper body assist Assist Level: Supervision/Verbal cueing    Lower Body Dressing/Undressing  Lower body dressing      What is the patient wearing?: Pants     Lower body assist Assist for lower body dressing: Contact Guard/Touching assist     Toileting Toileting    Toileting assist Assist for toileting: Contact Guard/Touching assist     Transfers Chair/bed transfer  Transfers assist     Chair/bed transfer assist level: Contact Guard/Touching assist     Locomotion Ambulation   Ambulation assist      Assist level: Contact Guard/Touching assist Assistive device: Walker-rolling Max distance: 200'   Walk 10 feet activity   Assist  Walk 10 feet activity did not occur: Safety/medical concerns  Assist level: Contact Guard/Touching assist Assistive device: Walker-rolling   Walk 50 feet activity   Assist Walk 50 feet with 2 turns activity did not occur:  Safety/medical concerns  Assist level: Contact Guard/Touching assist Assistive device: Walker-rolling    Walk 150 feet activity   Assist Walk 150 feet activity did not occur: Safety/medical concerns  Assist level: Contact Guard/Touching assist Assistive device: Walker-rolling    Walk 10 feet on uneven surface  activity   Assist Walk 10 feet on uneven surfaces activity did not occur: Safety/medical concerns         Wheelchair     Assist Is the patient using a wheelchair?: Yes Type of Wheelchair: Manual    Wheelchair assist level: Supervision/Verbal cueing Max wheelchair distance: 100'    Wheelchair 50 feet with 2 turns activity    Assist        Assist Level: Supervision/Verbal cueing   Wheelchair 150 feet activity     Assist      Assist Level: Dependent - Patient 0%   Blood pressure 123/72, pulse 67, temperature 98.1 F (36.7 C), temperature source Oral, resp. rate 17, height 5\' 8"  (1.727 m), weight 77.5 kg, SpO2 99 %.  Medical Problem List and Plan: 1. 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 Son has done family training and built some assistance at home.  2.  Antithrombotics: -DVT/anticoagulation:  Mechanical: Sequential compression devices, below knee Bilateral lower extremities  6/23- should be able to start Lovenox over weekend- started 6/24, monitor neuro status and hgb 7/3 hgb stable at 9.5             -antiplatelet therapy: N/A 3. Pain Management: Oxycodone prn. Gabapentin 300 mg bid for neuropathy. Pain adequate with pain meds  7/12- pain tolerable- con't regimen 4. Mood/Sleep: LCSW to follow for  evaluation and assist. Has been sleeping well.              -antipsychotic agents:  N/A 5. Neuropsych/cognition: This patient is capable of making decisions on his own behalf. 6. Skin/Wound Care: Monitor incision for healing.   6/29- asked nursing to change dressing  7/7- staples out- almost completely healed 7. Fluids/Electrolytes/Nutrition: Monitor I/O. Check CMET in am.  8. Cervical myelopathy s/p decompression C2/3 and C5/6:  --Collar to be on at all times.              --  dysphagia?  SLP for swallow evaluation.  9. HTN: Monitor BP TID. Continue Midamor, normodyne and amlodipine   6/26- BP elevated in 150s-160s systolic- if doesn't improve in next few days, (maxxed out on current BP meds), will check about other options, esp due to renal issues.   7/4- BP really elevated this AM- 150s-180s systolic- this is a big spike for him; if stays up, will add BP med. 7/12- BP great control in last 24 hours- con't regimen Vitals:   08/28/21 1956 08/29/21 0503  BP: 127/61 123/72  Pulse: 76 67  Resp: 18 17  Temp: 98.2 F (36.8 C) 98.1 F (36.7 C)  SpO2: 100% 99%    10. RA: On Arava per Dr. Cardell Peach in [place].  11. CKD 3 a:  BUN/SCr- 27/1.75 at admission-->improved since admisison --monitor with serial checks. Avoid nephrotoxic medications. 6/23- BUN 34 up from 22 and Cr 1.53- not sure exact baseline, but was 1.39 a few days ago- will give IVFs NS 75cc/hour x 24 hours and recheck in AM- think drinking impaired by swallowing issues.   6/27- will check BMP in AM  6/28- Cr 1.31 and NUN 24 down from 35- wil d/c IV  6/30- Cr 1.71- BUN up to 32- not sure if pt isn't drinking vs this his baseline? Will push fluids and recheck in AM  7/1- BUN 27 and Cr 1.55- down from 52 and 1.71- will recheck Monday  7/3 BUN improved today---continue to push fluids.. 7/7- doing great with fluids- Cr down to 1.35 and BUN 21- which is at baseline now-  7/9 Continue to push fluids and trend with weekly labs 7/10- Cr 1.46- and  BUN 20- in the realm of his baseline- will con't to monitor clinically    Latest Ref Rng & Units 08/27/2021    6:25 AM 08/24/2021    5:20 AM 08/20/2021    6:24 AM  BMP  Glucose 70 - 99 mg/dL 90  93  97   BUN 8 - 23 mg/dL 20  21  21    Creatinine 0.61 - 1.24 mg/dL 5.45  6.25  6.38   Sodium 135 - 145 mmol/L 135  139  137   Potassium 3.5 - 5.1 mmol/L 4.2  4.3  4.2   Chloride 98 - 111 mmol/L 101  103  103   CO2 22 - 32 mmol/L 24  24  26    Calcium 8.9 - 10.3 mg/dL 8.7  9.2  9.1     12. Acute on chronic anemia:     7/3 stable    Latest Ref Rng & Units 08/27/2021    6:25 AM 08/20/2021    6:24 AM 08/13/2021    6:12 AM  CBC  WBC 4.0 - 10.5 K/uL 4.4  5.5  6.3   Hemoglobin 13.0 - 17.0 g/dL 9.4  9.5  9.7   Hematocrit 39.0 - 52.0 % 29.2  29.6  30.1   Platelets 150 - 400 K/uL 255  274  232     13. Urinary urgency with incontinence-    -is now continent      7/9 Voids fine with urinal      LOS: 20 days A FACE TO FACE EVALUATION WAS PERFORMED  Trysta Showman 08/29/2021, 8:13 AM

## 2021-08-29 NOTE — Progress Notes (Signed)
Patient ID: Shawn Decker, male   DOB: 1945/11/08, 76 y.o.   MRN: [id number]  SW faxed outpatient PT/OT referral to [facility] location ([phone]/[phone]). *referral has been received.  Cecile Sheerer, MSW, LCSWA Office: [phone] Cell: [phone] Fax: [phone]

## 2021-08-30 LAB — GLUCOSE, CAPILLARY
Glucose-Capillary: 86 mg/dL (ref 70–99)
Glucose-Capillary: 109 mg/dL — ABNORMAL HIGH (ref 70–99)
Glucose-Capillary: 95 mg/dL (ref 70–99)
Glucose-Capillary: 114 mg/dL — ABNORMAL HIGH (ref 70–99)

## 2021-08-30 MED ORDER — METHOCARBAMOL 500 MG PO TABS
500.0000 mg | ORAL_TABLET | Freq: Four times a day (QID) | ORAL | 0 refills | Status: DC | PRN
  Filled 2021-08-30 – 2021-08-31 (×2): qty 45, 12d supply, fill #0

## 2021-08-30 MED ORDER — DOCUSATE SODIUM 100 MG PO CAPS
100.0000 mg | ORAL_CAPSULE | Freq: Two times a day (BID) | ORAL | 0 refills | Status: AC
  Filled 2021-08-30 – 2021-08-31 (×2): qty 60, 30d supply, fill #0

## 2021-08-30 MED ORDER — AMLODIPINE BESYLATE 10 MG PO TABS
10.0000 mg | ORAL_TABLET | Freq: Every day | ORAL | 0 refills | Status: AC
  Filled 2021-08-30: qty 30, 30d supply, fill #0

## 2021-08-30 MED ORDER — OXYCODONE HCL 5 MG PO TABS: 10.0000 mg | ORAL_TABLET | ORAL | Status: DC | PRN

## 2021-08-30 MED ORDER — GABAPENTIN 300 MG PO CAPS
300.0000 mg | ORAL_CAPSULE | Freq: Two times a day (BID) | ORAL | 0 refills | Status: AC
  Filled 2021-08-30 – 2021-08-31 (×2): qty 60, 30d supply, fill #0

## 2021-08-30 MED ORDER — SENNA 8.6 MG PO TABS
2.0000 | ORAL_TABLET | Freq: Every day | ORAL | 0 refills | Status: AC
  Filled 2021-08-30 – 2021-08-31 (×2): qty 60, 30d supply, fill #0

## 2021-08-30 MED ORDER — LABETALOL HCL 300 MG PO TABS
300.0000 mg | ORAL_TABLET | Freq: Two times a day (BID) | ORAL | 0 refills | Status: AC
  Filled 2021-08-30: qty 30, 15d supply, fill #0

## 2021-08-30 MED ORDER — AMILORIDE HCL 5 MG PO TABS
5.0000 mg | ORAL_TABLET | ORAL | 0 refills | Status: AC
  Filled 2021-08-30: qty 15, 35d supply, fill #0

## 2021-08-30 MED ORDER — OXYCODONE HCL 5 MG PO TABS
10.0000 mg | ORAL_TABLET | ORAL | Status: DC | PRN
  Administered 2021-08-30 – 2021-08-31 (×2): 10 mg via ORAL
  Filled 2021-08-30 (×2): qty 2

## 2021-08-30 MED ORDER — TAMSULOSIN HCL 0.4 MG PO CAPS
0.4000 mg | ORAL_CAPSULE | Freq: Every day | ORAL | 0 refills | Status: AC
  Filled 2021-08-30 – 2021-08-31 (×2): qty 30, 30d supply, fill #0

## 2021-08-30 MED ORDER — MAGNESIUM OXIDE 400 MG PO TABS
400.0000 mg | ORAL_TABLET | Freq: Two times a day (BID) | ORAL | 0 refills | Status: AC
  Filled 2021-08-30: qty 60, 30d supply, fill #0

## 2021-08-30 MED ORDER — OXYCODONE HCL 10 MG PO TABS
10.0000 mg | ORAL_TABLET | Freq: Four times a day (QID) | ORAL | 0 refills | Status: AC | PRN
Start: 2021-08-30 — End: ?
  Filled 2021-08-30: qty 30, 8d supply, fill #0
  Filled 2021-08-31: qty 28, 7d supply, fill #0

## 2021-08-30 MED ORDER — ATORVASTATIN CALCIUM 20 MG PO TABS
20.0000 mg | ORAL_TABLET | Freq: Every day | ORAL | 0 refills | Status: AC
  Filled 2021-08-30: qty 30, 30d supply, fill #0

## 2021-08-30 NOTE — Progress Notes (Signed)
Recreational Therapy Session Note  Patient Details  Name: Trinton Prewitt MRN: [id number] Date of Birth: April 19, 1945 Today's Date: 08/30/2021  Pain: no c/o Skilled Therapeutic Interventions/Progress Updates:  Goal:  Pt will ambulate on community surfaces, including even and uneven and negotiating obstacles using RW with close supervision-contact guard assist.   MET  Pt will problem solve public restroom access with min cues.  MET  Pt will be educated on energy conservation techniques in preparation for community pursuits.  MET  Session focused on community reintegration ambulatory and w/c level.  Pt propelled w/c using BUEs with supervision throughout the unit.  Pt ambulated throughout the atrium lobby with supervision using RW and performed functional transfers with arm rests with supervision.  Pt ambulated outdoors on uneven surfaces and negotiated obstacles with contact guard assist.  Pt also navigated  4 community steps with one rail with contact guard assist.  Education provided on accessing public restroom, safety concerns/obstacles in community setting and energy conservation.  Pt appreciated of this session and education.  Pt is looking forward to returning home with family and previously enjoyed activities.  Co-Treatment   Margarete Horace 08/30/2021, 4:10 PM

## 2021-08-30 NOTE — Progress Notes (Signed)
Occupational Therapy Discharge Summary  Patient Details  Name: Shawn Decker MRN: [id number] Date of Birth: August 04, 1945  Patient has met 12 of 12 long term goals due to improved activity tolerance, improved balance, postural control, ability to compensate for deficits, and functional use of  RIGHT upper and LEFT upper extremity.  Pt made steady progress with BADLs and functional tranfsers during this admission. Pt completes BADLs and functional tranfsers with supervision/CGA. Pt's son has been present and participated in therapy education and provides the appropriate level of supervision/assistance. Patient to discharge at [facility] Assist level.  Patient's care partner is independent to provide the necessary physical assistance at discharge.    Reasons goals not met: n/a  Recommendation:  Patient will benefit from ongoing skilled OT services in outpatient setting to continue to advance functional skills in the area of BADL.  Equipment: No equipment provided  Reasons for discharge: treatment goals met and discharge from hospital  Patient/family agrees with progress made and goals achieved: Yes  OT Discharge PADL ADL Equipment Provided: Reacher Eating: Modified independent Where Assessed-Eating: Wheelchair Grooming: Supervision/safety Where Assessed-Grooming: Sitting at sink Upper Body Bathing: Supervision/safety Where Assessed-Upper Body Bathing: Sitting at sink, Wheelchair Lower Body Bathing: Contact guard Where Assessed-Lower Body Bathing: Sitting at sink, Standing at sink Upper Body Dressing: Supervision/safety Where Assessed-Upper Body Dressing: Sitting at sink Lower Body Dressing: Contact guard Where Assessed-Lower Body Dressing: Sitting at sink, Standing at sink Toileting: Contact guard Where Assessed-Toileting: Glass blower/designer: Close supervision Toilet Transfer Method: Counselling psychologist: Grab bars, Extra wide drop arm bedside commode Tub/Shower  Transfer: Maximal assistance Tub/Shower Transfer Method: Squat pivot Tub/Shower Equipment: Facilities manager: Curator Method: Heritage manager: Radio broadcast assistant ADL Comments: Pt able to complete ambulatory transfer from EOB > toilet > sink > EOB with CGA and use of FWW. Pt reports increased pain near L hip/lower back which causes instability when transferring back to bed. Education and handout provided to pt on importance of obtaining AE for ADLs in order to improve independence after DC. OT highlighted recommended equipment on AE/DME handout to allow pt to give handout to family for assistance in obtaining these tools. Vision Baseline Vision/History: 0 No visual deficits Patient Visual Report: No change from baseline Vision Assessment?: No apparent visual deficits Perception  Perception: Within Functional Limits Praxis Praxis: Intact Cognition Cognition Overall Cognitive Status: Within Functional Limits for tasks assessed Arousal/Alertness: Awake/alert Orientation Level: Person;Place;Situation Person: Oriented Place: Oriented Situation: Oriented Memory: Impaired Memory Impairment: Storage deficit;Retrieval deficit;Decreased recall of new information Attention: Sustained Focused Attention: Appears intact Sustained Attention: Appears intact Awareness: Appears intact Problem Solving: Appears intact Safety/Judgment: Appears intact Brief Interview for Mental Status (BIMS) Repetition of Three Words (First Attempt): 3 Temporal Orientation: Year: Correct Temporal Orientation: Month: Accurate within 5 days Temporal Orientation: Day: Correct Recall: "Sock": Yes, no cue required Recall: "Blue": Yes, no cue required Recall: "Bed": Yes, no cue required BIMS Summary Score: 15 Sensation Sensation Light Touch: Impaired by gross assessment Hot/Cold: Appears Intact Proprioception: Appears Intact Stereognosis:  Impaired by gross assessment Coordination Gross Motor Movements are Fluid and Coordinated: Yes Fine Motor Movements are Fluid and Coordinated: Yes Motor  Motor Motor: Motor impersistence;Tetraplegia Motor - Skilled Clinical Observations: B Le weakness > B UE weakness in general, slow motor output, decreased control for gross and fine tasks Bly Trunk/Postural Assessment  Cervical Assessment Cervical Assessment: Exceptions to [facility] (cervical collar) Cervical AROM Overall Cervical AROM: Due to precautions  Thoracic Assessment Thoracic Assessment: Within Functional Limits Lumbar Assessment Lumbar Assessment: Within Functional Limits Postural Control Righting Reactions: slowed Protective Responses: delayed  Balance Static Sitting Balance Static Sitting - Balance Support: Feet supported Static Sitting - Level of Assistance: 6: Modified independent (Device/Increase time) Dynamic Sitting Balance Dynamic Sitting - Balance Support: During functional activity Dynamic Sitting - Level of Assistance: 6: Modified independent (Device/Increase time) Extremity/Trunk Assessment RUE Assessment RUE Assessment: Exceptions to [facility] Active Range of Motion (AROM) Comments: 0-90 with increased effort General Strength Comments: 4/5 RUE AROM (degrees) Overall AROM Right Upper Extremity: Due to precautions LUE Assessment LUE Assessment: Exceptions to [facility] Active Range of Motion (AROM) Comments: 0-90 General Strength Comments: 4/5   Leroy Libman 08/30/2021, 7:03 AM

## 2021-08-30 NOTE — Progress Notes (Signed)
Occupational Therapy Session Note  Patient Details  Name: Alann Avey MRN: [id number] Date of Birth: 19-Apr-1945  Today's Date: 08/30/2021 OT Individual Time: 7209-4709 OT Individual Time Calculation (min): 73 min    Short Term Goals: Week 3:  OT Short Term Goal 1 (Week 3): STG=LTG 2/2 ELOS (continue working towards supervision goals)  Skilled Therapeutic Interventions/Progress Updates:    Pt eating breakfast upon arrival. OT intervention with focus on bed mobility, standing balance, BADLs, functional amb with RW, safety awareness, and discharge planning to prepare for discharge home tomorrow. All functional tranfsers and amb with RW at supervision level. LB dressing with CGA when standing. Pt uses reacher appropriately to assist with donning pants and shoes. All bed mobility with supervision. Pt pleased with progress and looking forward to discharging home tomorrow.  Therapy Documentation Precautions:  Precautions Precautions: Cervical Precaution Booklet Issued: No Precaution Comments: verbally reviewed 3/3 cervical precautions Required Braces or Orthoses: Cervical Brace Cervical Brace: Hard collar, At all times Restrictions Weight Bearing Restrictions: No Pain:  Pt reports his pain is "not too bad" this morning   Therapy/Group: Individual Therapy  Rich Brave 08/30/2021, 8:14 AM

## 2021-08-30 NOTE — Progress Notes (Signed)
PROGRESS NOTE   Subjective/Complaints:   No issues- is still making gains per OT and PT.   ROS:   Pt denies SOB, abd pain, CP, N/V/C/D, and vision changes  Objective:   No results found. No results for input(s): "WBC", "HGB", "HCT", "PLT" in the last 72 hours.    No results for input(s): "NA", "K", "CL", "CO2", "GLUCOSE", "BUN", "CREATININE", "CALCIUM" in the last 72 hours.    Intake/Output Summary (Last 24 hours) at 08/30/2021 0945 Last data filed at 08/30/2021 0745 Gross per 24 hour  Intake 960 ml  Output 1350 ml  Net -390 ml        Physical Exam: Vital Signs Blood pressure 135/71, pulse 70, temperature 98.5 F (36.9 C), resp. rate 18, height 5\' 8"  (1.727 m), weight 77.5 kg, SpO2 100 %.       General: awake, alert, appropriate, sitting up in w/c at sink; NAD HENT: conjugate gaze; oropharynx moist-cervical collar in place-  CV: regular rate; no JVD Pulmonary: CTA B/L; no W/R/R- good air movement GI: soft, NT, ND, (+)BS Psychiatric: appropriate Neurological: poor to fair memory Musculoskeletal:     Cervical back: Neck supple. No tenderness.     Comments: RUE strength deltoid 5-/5; biceps 5/5; triceps 5-/5; WE 5-/5; Grip 5/5; LUE 4/5 Delt , bi tri grip LE strength HF 4/5; KE/KF 4+/5; DF 4-/5 and PF 4+/5 B/L  B/L no effusions of B/L knees  Skin:    General: Skin is warm and dry.     Comments: Multiple healed abrasions bilateral shins, neck incision has complete healed on posterior neck  Neurological:     Alert and oriented x 3. Normal insight and awareness. Intact Memory. Normal language and speech. Cranial nerve exam unremarkable     Comments: Speech clear today, no throat clearing noticed, but has on prior exams had issues with needing to clear throat multiple times. Decreased to light touch in tips of fingers and toes- but not in dermatomal distribution B/L --no change Absent DTRs in LE's B/L --no  change, tested again today, still absent DTR's BLE.  Assessment/Plan: 1. Functional deficits which require 3+ hours per day of interdisciplinary therapy in a comprehensive inpatient rehab setting. Physiatrist is providing close team supervision and 24 hour management of active medical problems listed below. Physiatrist and rehab team continue to assess barriers to discharge/monitor patient progress toward functional and medical goals  Care Tool:  Bathing    Body parts bathed by patient: Right arm, Left arm, Chest, Abdomen, Front perineal area, Right upper leg, Left upper leg, Face, Right lower leg, Left lower leg, Buttocks   Body parts bathed by helper: Buttocks, Right lower leg, Left lower leg     Bathing assist Assist Level: Contact Guard/Touching assist     Upper Body Dressing/Undressing Upper body dressing   What is the patient wearing?: Pull over shirt, Orthosis Orthosis activity level: Performed by helper  Upper body assist Assist Level: Independent with assistive device    Lower Body Dressing/Undressing Lower body dressing      What is the patient wearing?: Pants     Lower body assist Assist for lower body dressing: Supervision/Verbal cueing  Toileting Toileting    Toileting assist Assist for toileting: Contact Guard/Touching assist     Transfers Chair/bed transfer  Transfers assist     Chair/bed transfer assist level: Contact Guard/Touching assist     Locomotion Ambulation   Ambulation assist      Assist level: Contact Guard/Touching assist Assistive device: Walker-rolling Max distance: 200'   Walk 10 feet activity   Assist  Walk 10 feet activity did not occur: Safety/medical concerns  Assist level: Contact Guard/Touching assist Assistive device: Walker-rolling   Walk 50 feet activity   Assist Walk 50 feet with 2 turns activity did not occur: Safety/medical concerns  Assist level: Contact Guard/Touching assist Assistive device:  Walker-rolling    Walk 150 feet activity   Assist Walk 150 feet activity did not occur: Safety/medical concerns  Assist level: Contact Guard/Touching assist Assistive device: Walker-rolling    Walk 10 feet on uneven surface  activity   Assist Walk 10 feet on uneven surfaces activity did not occur: Safety/medical concerns         Wheelchair     Assist Is the patient using a wheelchair?: Yes Type of Wheelchair: Manual    Wheelchair assist level: Supervision/Verbal cueing Max wheelchair distance: 100'    Wheelchair 50 feet with 2 turns activity    Assist        Assist Level: Supervision/Verbal cueing   Wheelchair 150 feet activity     Assist      Assist Level: Dependent - Patient 0%   Blood pressure 135/71, pulse 70, temperature 98.5 F (36.9 C), resp. rate 18, height  (1.727 m), weight 77.5 kg, SpO2 100 %.  Medical Problem List and Plan: 1. 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 Son has done family training and built some assistance at home.  7/13 Con't CIR, PT and OT and SLP 2.  Antithrombotics: -DVT/anticoagulation:  Mechanical: Sequential compression devices, below knee Bilateral lower extremities  6/23- should be able to start Lovenox over weekend- started 6/24, monitor neuro status and hgb 7/3 hgb stable at 9.5             -antiplatelet therapy: N/A 3. Pain Management: Oxycodone prn. Gabapentin 300 mg bid for neuropathy. Pain adequate with pain meds  7/13- pain controlled- adequate- con't regimen 4. Mood/Sleep: LCSW to follow for evaluation and assist. Has been sleeping well.               -antipsychotic agents:  N/A 5. Neuropsych/cognition: This patient is capable of making decisions on his own behalf. 6. Skin/Wound Care: Monitor incision for healing.   6/29- asked nursing to change dressing  7/7- staples out- almost completely healed 7. Fluids/Electrolytes/Nutrition: Monitor I/O. Check CMET in am.  8. Cervical myelopathy s/p decompression C2/3 and C5/6:  --Collar to be on at all times.              --dysphagia?  SLP for swallow evaluation.  9. HTN: Monitor BP TID. Continue Midamor, normodyne and amlodipine   6/26- BP elevated in 150s-160s systolic-  if doesn't improve in next few days, (maxxed out on current BP meds), will check about other options, esp due to renal issues.   7/4- BP really elevated this AM- 150s-180s systolic- this is a big spike for him; if stays up, will add BP med. 7/12- BP great control in last 24 hours- con't regimen Vitals:   08/29/21 1941 08/30/21 0431  BP: (!) 126/54 135/71  Pulse: 71 70  Resp: 16 18  Temp: 98.4 F (36.9 C) 98.5 F (36.9 C)  SpO2: 99% 100%    10. RA: On Arava per Dr. Cardell Peach in [place].  11. CKD 3 a:  BUN/SCr- 27/1.75 at admission-->improved since admisison --monitor with serial checks. Avoid nephrotoxic medications. 6/23- BUN 34 up from 22 and Cr 1.53- not sure exact baseline, but was 1.39 a few days ago- will give IVFs NS 75cc/hour x 24 hours and recheck in AM- think drinking impaired by swallowing issues.   6/27- will check BMP in AM  6/28- Cr 1.31 and NUN 24 down from 35- wil d/c IV  6/30- Cr 1.71- BUN up to 32- not sure if pt isn't drinking vs this his baseline? Will push fluids and recheck in AM  7/1- BUN 27 and Cr 1.55- down from 52 and 1.71- will recheck Monday  7/3 BUN improved today---continue to push fluids.. 7/7- doing great with fluids- Cr down to 1.35 and BUN 21- which is at baseline now-  7/9 Continue to push fluids and trend with weekly labs 7/10- Cr 1.46- and BUN 20- in the realm of his baseline- will con't to  monitor clinically    Latest Ref Rng & Units 08/27/2021    6:25 AM 08/24/2021    5:20 AM 08/20/2021    6:24 AM  BMP  Glucose 70 - 99 mg/dL 90  93  97   BUN 8 - 23 mg/dL 20  21  21    Creatinine 0.61 - 1.24 mg/dL  7.51  0.25   Sodium 135 - 145 mmol/L 135  139  137   Potassium 3.5 - 5.1 mmol/L 4.2  4.3  4.2   Chloride 98 - 111 mmol/L 101  103  103   CO2 22 - 32 mmol/L 24  24  26    Calcium 8.9 - 10.3 mg/dL 8.7  9.2  9.1     12. Acute on chronic anemia:     7/3 stable    Latest Ref Rng & Units 08/27/2021    6:25 AM 08/20/2021    6:24 AM 08/13/2021    6:12 AM  CBC  WBC 4.0 - 10.5 K/uL 4.4  5.5  6.3   Hemoglobin 13.0 - 17.0 g/dL 9.4  9.5  9.7   Hematocrit 39.0 - 52.0 % 29.2  29.6  30.1   Platelets 150 - 400 K/uL 255  274  232     13. Urinary urgency with incontinence-    -is now continent      7/9 Voids fine with urinal      LOS: 21 days A FACE TO FACE EVALUATION WAS PERFORMED  Kerrilynn Derenzo 08/30/2021, 9:45 AM

## 2021-08-30 NOTE — Progress Notes (Incomplete)
Inpatient Rehabilitation Discharge Medication Review by a Pharmacist  A complete drug regimen review was completed for this patient to identify any potential clinically significant medication issues.  High Risk Drug Classes Is patient taking? Indication by Medication  Antipsychotic No   Anticoagulant No   Antibiotic No   Opioid Yes OxyIR- acute pain  Antiplatelet No   Hypoglycemics/insulin No   Vasoactive Medication Yes Amiloride, norvasc, normodyne, flomax- HTN, BPH  Chemotherapy No   Other Yes Arava- RA Gabapentin- neuropathic pain Lipitor- HLD     Type of Medication Issue Identified Description of Issue Recommendation(s)  Drug Interaction(s) (clinically significant)     Duplicate Therapy     Allergy     No Medication Administration End Date     Incorrect Dose     Additional Drug Therapy Needed     Significant med changes from prior encounter (inform family/care partners about these prior to discharge).    Other       Clinically significant medication issues were identified that warrant physician communication and completion of prescribed/recommended actions by midnight of the next day:  No   Time spent performing this drug regimen review (minutes):  30   Maysel Mccolm BS, PharmD, BCPS Clinical Pharmacist 08/30/2021 11:55 AM  Contact: [phone] after 3 PM  "Be curious, not judgmental..." -Debbora Dus

## 2021-08-30 NOTE — Progress Notes (Signed)
Physical Therapy Session Note  Patient Details  Name: Shawn Decker MRN: [id number] Date of Birth: 1945-12-01  Today's Date: 08/30/2021 PT Individual Time: 0900-1015; 1400-1500 PT Individual Time Calculation (min): 75 min and 60 min  Short Term Goals: Week 3:  PT Short Term Goal 1 (Week 3): =LTG due to ELOS  Skilled Therapeutic Interventions/Progress Updates:    Session 1: Pt received seated in w/c in room, agreeable to PT session. No complaints of pain. Sit to stand and transfers with RW at Supervision level throughout session. Ambulation up to 150 ft with RW at Supervision level. Nustep level 5 x 5 min with use of B UE/LE for global strengthening. Trial gait with no AD and min HHA x 50 ft, some ataxic and scissoring of gait but no overt LOB. Sidesteps L/R 2 sets of 3 x 10 ft in // bars at Supervision level, cues for upright posture and LE clearance. Alt L/R 4" step-downs in // bars x 10-15 reps to fatigue for quad strengthening and eccentric control. Alt L/R 6" step-ups in // bars x 10-15 reps to fatigue for LE strengthening. Sit to stand 2 x 10 reps with hands on thighs and CGA for focus on LE strengthening. Pt returned to room and left seated in w/c with needs in reach at end of session.  Session 2: Cotreatment session with LRT. Pt received seated in w/c in room, no complaints of pain, agreeable to PT session. Session focus on simulated community outing in [facility]. Pt able to perform sit to stand from various height surfaces with RW at Supervision level. Ambulation indoors and outdoors across uneven ground with RW at Supervision to CGA level up/down inclines with cues for safety regarding area rugs. Ascend/descend 4 x 6" stairs with one handrail and CGA for balance. Pt taken into public restroom and discussed accessibility in the community and planning how to safely toilet with friends or family members providing Supervision assist as well as how to safely navigate public restrooms via  w/c and/or RW. Pt understanding of all education. TUG performed, pt scores 20.3 sec average on TUG. Reviewed score and functional implications. Pt left seated in w/c in room with needs in reach at end of session.  Therapy Documentation Precautions:  Precautions Precautions: Cervical Precaution Booklet Issued: No Precaution Comments: verbally reviewed 3/3 cervical precautions Required Braces or Orthoses: Cervical Brace Cervical Brace: Hard collar, At all times Restrictions Weight Bearing Restrictions: No       Therapy/Group: Individual Therapy   Peter Congo, PT, DPT, CSRS 08/30/2021, 10:15 AM

## 2021-08-30 NOTE — Discharge Summary (Signed)
Physician Discharge Summary  Patient ID: Shawn Decker MRN: [id number] DOB/AGE: 1945/11/01 76 y.o.  Admit date: 08/09/2021 Discharge date: 08/31/2021  Discharge Diagnoses:  Principal Problem:   Cervical myelopathy (HCC) Active Problems:   Hypertension   Stage 3a chronic kidney disease (CKD) (HCC)   Neuropathy   Acute blood loss anemia   Discharged Condition: good  Significant Diagnostic Studies: N/A   Labs:  Basic Metabolic Panel:    Latest Ref Rng & Units 08/27/2021    6:25 AM 08/24/2021    5:20 AM 08/20/2021    6:24 AM  BMP  Glucose 70 - 99 mg/dL 90  93  97   BUN 8 - 23 mg/dL 20  21  21    Creatinine 0.61 - 1.24 mg/dL  1.74  0.81   Sodium 135 - 145 mmol/L 135  139  137   Potassium 3.5 - 5.1 mmol/L 4.2  4.3  4.2   Chloride 98 - 111 mmol/L 101  103  103   CO2 22 - 32 mmol/L 24  24  26    Calcium 8.9 - 10.3 mg/dL 8.7  9.2  9.1      CBC:    Latest Ref Rng & Units 08/27/2021    6:25 AM 08/20/2021    6:24 AM 08/13/2021    6:12 AM  CBC  WBC 4.0 - 10.5 K/uL 4.4  5.5  6.3   Hemoglobin 13.0 - 17.0 g/dL 9.4  9.5  9.7   Hematocrit 39.0 - 52.0 % 29.2  29.6  30.1   Platelets 150 - 400 K/uL 255  274  232      CBG: Recent Labs  Lab 08/30/21 0612 08/30/21 1133 08/30/21 1635 08/30/21 2122 08/31/21 0616  GLUCAP 86 109* 95 114* 84    Brief HPI:   Shawn Decker is a 76 y.o. male with history of T2DM, HTN, CKD, RA, cervical decompression in the past with peripheral neuropathy who was admitted on 08/04/21 with 2-3 week history of numbness, weakness, tingling and inability to walk or raise his upper extremities.  MRI C-spine showed C3/C4 and C5/C6 ACDF with significant adjustment segment disease, spinal stenosis and spinal cord mass effect with abnormal signal at C2/C3 and C5/C6 adjacent segments.  He was evaluated by Dr. 61 and underwent cervical decompression C2/3 and C5/6 on the same day.  Postop continue to be limited by myelopathy with bilateral upper extremity and bilateral  lower extremity weakness, ataxia as well as difficulty clearing secretions.  Therapy was working with patient and CIR was recommended due to functional decline.   Hospital Course: Rondle Lohse was admitted to rehab 08/09/2021 for inpatient therapies to consist of PT, ST and OT at least three hours five days a week. Past admission physiatrist, therapy team and rehab RN have worked together to provide customized collaborative inpatient rehab.  His blood pressures were monitored on TID basis and BP control has improved. His blood sugars were monitored with ac/hs CBG with sliding scale insulin use for elevated blood sugars.  His p.o. intake has been good and blood sugars have been well controlled.  Neck incision is C/C/R and is healing well without any signs or symptoms of infection.   Serial check of BMET showed hyponatremia has resolved but serum creatinine was  trending upwards. He received IVF briefly with improvement. Repeat ceck of electrolytes showed SCr trending back up to 1.3-1.4 range which is likely his baseline and recommend repeat BMET in 2 weeks to monitor for stability.  Follow-up  CBC shows acute blood loss anemia to be stable.  Constipation has resolved with augmentation of bowel regimen.  He was started on Flomax to help with voiding function and bladder control has improved with resolution of incontinence.  Made steady gains during his rehab stay and currently requires supervision with cues for safety. He will continue to receive outpatient PT/OT at [facility] rehab in [place].    Rehab course: During patient's stay in rehab weekly team conferences were held to monitor patient's progress, set goals and discuss barriers to discharge. At admission, patient required mod assist with mobility and max assist with ADL tasks.  He exhibited mild cognitive impairments and swallow evaluation did not show S/S of aspiration. He  has had improvement in activity tolerance, balance, postural control  as well as ability to compensate for deficits. He is able to complete ADL tasks with CGA to supervision.  He requires supervision with cues for tranfers and to ambulate 200' with RW. Family education has been done with son.   Disposition: Home  Diet: Heart healthy/carb modified  Special Instructions: No alcohol, driving or strenuous activity till cleared by MD. 2. Repeat BMET/CBC in 2 weeks to monitor renal status as well as H/H.   Discharge Instructions     Ambulatory referral to Physical Medicine Rehab   Complete by: As directed    Hospital follow up      Allergies as of 08/31/2021   No Known Allergies      Medication List     STOP taking these medications    HYDROcodone-acetaminophen 5-325 MG tablet Commonly known as: NORCO/VICODIN   ibuprofen 400 MG tablet Commonly known as: ADVIL   losartan 100 MG tablet Commonly known as: COZAAR   polyethylene glycol 17 g packet Commonly known as: MIRALAX / GLYCOLAX   tadalafil 5 MG tablet Commonly known as: CIALIS       TAKE these medications    acetaminophen 325 MG tablet Commonly known as: TYLENOL Take 1-2 tablets (325-650 mg total) by mouth every 4 (four) hours as needed for mild pain.   aMILoride 5 MG tablet Commonly known as: MIDAMOR Take 1 tablet (5 mg total) by mouth every Monday, Wednesday, and Friday.   amLODipine 10 MG tablet Commonly known as: NORVASC Take 1 tablet (10 mg total) by mouth daily.   atorvastatin 20 MG tablet Commonly known as: LIPITOR Take 1 tablet (20 mg total) by mouth daily. What changed:  how much to take when to take this   docusate sodium 100 MG capsule Commonly known as: COLACE Take 1 capsule (100 mg total) by mouth 2 (two) times daily.   gabapentin 300 MG capsule Commonly known as: Neurontin Take 1 capsule (300 mg total) by mouth 2 (two) times daily.   labetalol 300 MG tablet Commonly known as: NORMODYNE Take 1 tablet (300 mg total) by mouth 2 (two) times daily.    leflunomide 20 MG tablet Commonly known as: ARAVA Take 1 tablet by mouth daily.   magnesium oxide 400 MG tablet Commonly known as: MAG-OX Take 1 tablet (400 mg total) by mouth 2 (two) times daily. What changed:  how much to take when to take this   methocarbamol 500 MG tablet Commonly known as: ROBAXIN Take 1 tablet (500 mg total) by mouth every 6 (six) hours as needed for muscle spasms.   Oxycodone HCl 10 MG Tabs--Rx# 30 pills. Take 1 tablet (10 mg total) by mouth every 6 (six) hours as needed for severe pain.  senna 8.6 MG Tabs tablet Commonly known as: SENOKOT Take 2 tablets (17.2 mg total) by mouth daily.   tamsulosin 0.4 MG Caps capsule Commonly known as: FLOMAX Take 1 capsule (0.4 mg total) by mouth daily after breakfast. What changed: when to take this        Follow-up Information     [facility] Follow [facility].   Specialty: Family Medicine Why: Call in 1-2 days for post hospital follow up        Courtney Heys, MD Follow up.   Specialty: Physical Medicine and Rehabilitation Why: office will call you with follow up appointment Contact information: [id number] [place] [address] [phone]         Kristeen Miss, MD Follow up.   Specialty: Neurosurgery Why: Call in 1-2 days for post hospital follow up Contact information: [address] [place] [address] [phone]                 Signed: Bary Leriche 08/31/2021, 4:29 PM

## 2021-08-31 ENCOUNTER — Other Ambulatory Visit (HOSPITAL_COMMUNITY): Payer: Self-pay

## 2021-08-31 DIAGNOSIS — D62 Acute posthemorrhagic anemia: Secondary | ICD-10-CM

## 2021-08-31 DIAGNOSIS — G629 Polyneuropathy, unspecified: Secondary | ICD-10-CM

## 2021-08-31 LAB — GLUCOSE, CAPILLARY: Glucose-Capillary: 84 mg/dL (ref 70–99)

## 2021-08-31 NOTE — Progress Notes (Signed)
PROGRESS NOTE   Subjective/Complaints:   Pt reports LBM yesterday Has been forgetting to ask for pain meds- pain tolerable, but not as good as when does take meds- recognizes that constipation also better.     ROS:   Pt denies SOB, abd pain, CP, N/V/C/D, and vision changes  Objective:   No results found. No results for input(s): "WBC", "HGB", "HCT", "PLT" in the last 72 hours.    No results for input(s): "NA", "K", "CL", "CO2", "GLUCOSE", "BUN", "CREATININE", "CALCIUM" in the last 72 hours.    Intake/Output Summary (Last 24 hours) at 08/31/2021 0825 Last data filed at 08/31/2021 0758 Gross per 24 hour  Intake 1240 ml  Output 975 ml  Net 265 ml        Physical Exam: Vital Signs Blood pressure 139/73, pulse 74, temperature 98.1 F (36.7 C), resp. rate 18, height 5\' 8"  (1.727 m), weight 77.5 kg, SpO2 99 %.        General: awake, alert, appropriate, sitting up in bed;  NAD HENT: conjugate gaze; oropharynx moist- incision healed- cervical collar in place CV: regular rate; no JVD Pulmonary: CTA B/L; no W/R/R- good air movement GI: soft, NT, ND, (+)BS Psychiatric: appropriate Neurological: fair to poor memory- good social interaction Musculoskeletal:     Cervical back: Neck supple. No tenderness.     Comments: RUE strength deltoid 5-/5; biceps 5/5; triceps 5-/5; WE 5-/5; Grip 5/5; LUE 4/5 Delt , bi tri grip LE strength HF 4/5; KE/KF 4+/5; DF 4-/5 and PF 4+/5 B/L  B/L no effusions of B/L knees  Skin:    General: Skin is warm and dry.     Comments: Multiple healed abrasions bilateral shins, neck incision has complete healed on posterior neck  Neurological:     Alert and oriented x 3. Normal insight and awareness. Intact Memory. Normal language and speech. Cranial nerve exam unremarkable     Comments: Speech clear today, no throat clearing noticed, but has on prior exams had issues with needing to clear throat  multiple times. Decreased to light touch in tips of fingers and toes- but not in dermatomal distribution B/L --no change Absent DTRs in LE's B/L --no change, tested again today, still absent DTR's BLE.  Assessment/Plan: 1. Functional deficits which require 3+ hours per day of interdisciplinary therapy in a comprehensive inpatient rehab setting. Physiatrist is providing close team supervision and 24 hour management of active medical problems listed below. Physiatrist and rehab team continue to assess barriers to discharge/monitor patient progress toward functional and medical goals  Care Tool:  Bathing    Body parts bathed by patient: Right arm, Left arm, Chest, Abdomen, Front perineal area, Right upper leg, Left upper leg, Face, Right lower leg, Left lower leg, Buttocks   Body parts bathed by helper: Buttocks, Right lower leg, Left lower leg     Bathing assist Assist Level: Contact Guard/Touching assist     Upper Body Dressing/Undressing Upper body dressing   What is the patient wearing?: Pull over shirt, Orthosis Orthosis activity level: Performed by helper  Upper body assist Assist Level: Independent with assistive device    Lower Body Dressing/Undressing Lower body dressing  What is the patient wearing?: Pants     Lower body assist Assist for lower body dressing: Supervision/Verbal cueing     Toileting Toileting    Toileting assist Assist for toileting: Contact Guard/Touching assist     Transfers Chair/bed transfer  Transfers assist     Chair/bed transfer assist level: Supervision/Verbal cueing     Locomotion Ambulation   Ambulation assist      Assist level: Supervision/Verbal cueing Assistive device: Walker-rolling Max distance: 150'   Walk 10 feet activity   Assist  Walk 10 feet activity did not occur: Safety/medical concerns  Assist level: Supervision/Verbal cueing Assistive device: Walker-rolling   Walk 50 feet activity   Assist  Walk 50 feet with 2 turns activity did not occur: Safety/medical concerns  Assist level: Supervision/Verbal cueing Assistive device: Walker-rolling    Walk 150 feet activity   Assist Walk 150 feet activity did not occur: Safety/medical concerns  Assist level: Supervision/Verbal cueing Assistive device: Walker-rolling    Walk 10 feet on uneven surface  activity   Assist Walk 10 feet on uneven surfaces activity did not occur: Safety/medical concerns   Assist level: Contact Guard/Touching assist Assistive device: Walker-rolling   Wheelchair     Assist Is the patient using a wheelchair?: Yes Type of Wheelchair: Manual    Wheelchair assist level: Independent Max wheelchair distance: 150'    Wheelchair 50 feet with 2 turns activity    Assist        Assist Level: Independent   Wheelchair 150 feet activity     Assist      Assist Level: Independent   Blood pressure 139/73, pulse 74, temperature 98.1 F (36.7 C), resp. rate 18, height 5\' 8"  (1.727 m), weight 77.5 kg, SpO2 99 %.  Medical Problem List and Plan: 1. 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 Son has done family training and built some assistance at home.  7/13 Con't CIR, PT and OT and SLP 7/14- d/c today - will need hospital f/u with Dr Berline Chough- if cannot get in soon, put with NP or Dr Benjie Karvonen then f/u with me 2.  Antithrombotics: -DVT/anticoagulation:  Mechanical: Sequential compression devices, below knee Bilateral lower extremities  6/23- should be able to start Lovenox over weekend- started 6/24, monitor neuro status and hgb 7/3 hgb stable at 9.5              -antiplatelet therapy: N/A 3. Pain Management: Oxycodone prn. Gabapentin 300 mg bid for neuropathy. Pain adequate with pain meds  7/14- send home with 7 days of meds- might need refill, might not after that 4. Mood/Sleep: LCSW to follow for evaluation and assist. Has been sleeping well.              -antipsychotic agents:  N/A 5. Neuropsych/cognition: This patient is capable of making decisions on his own behalf. 6. Skin/Wound Care: Monitor incision for healing.   6/29- asked nursing to change dressing  7/7- staples out- almost completely healed 7. Fluids/Electrolytes/Nutrition: Monitor I/O. Check CMET in am.  8.  Cervical myelopathy s/p decompression C2/3 and C5/6:  --Collar to be on at all times.              --dysphagia?  SLP for swallow evaluation.  9. HTN: Monitor BP TID. Continue Midamor, normodyne and amlodipine   6/26- BP elevated in 150s-160s systolic- if doesn't improve in next few days, (maxxed out on current BP meds), will check about other options, esp due to renal issues.   7/4- BP really elevated this AM- 150s-180s systolic- this is a big spike for him; if stays up, will add BP med. 7/12- BP great control in last 24 hours- con't regimen Vitals:   08/30/21 2148 08/31/21 0445  BP: (!) 158/70 139/73  Pulse: 73 74  Resp:  18  Temp:  98.1 F (36.7 C)  SpO2:  99%    10. RA: On Arava per Dr. Cardell Peach in [place].  11. CKD 3 a:  BUN/SCr- 27/1.75 at admission-->improved since admisison --monitor with serial checks. Avoid nephrotoxic medications. 6/23- BUN 34 up from 22 and Cr 1.53- not sure exact baseline, but was 1.39 a few days ago- will give IVFs NS 75cc/hour x 24 hours and recheck in AM- think drinking impaired by swallowing issues.   6/27- will check BMP in AM  6/28- Cr 1.31 and NUN 24 down from 35- wil d/c IV  6/30- Cr 1.71- BUN up to 32- not sure if pt isn't drinking vs this his baseline? Will push fluids and recheck in AM  7/1- BUN 27 and Cr 1.55- down from 52 and 1.71- will  recheck Monday  7/3 BUN improved today---continue to push fluids.. 7/7- doing great with fluids- Cr down to 1.35 and BUN 21- which is at baseline now-  7/9 Continue to push fluids and trend with weekly labs 7/10- Cr 1.46- and BUN 20- in the realm of his baseline- will con't to monitor clinically    Latest Ref Rng & Units 08/27/2021    6:25 AM 08/24/2021    5:20 AM 08/20/2021    6:24 AM  BMP  Glucose 70 - 99 mg/dL 90  93  97   BUN 8 - 23 mg/dL 20  21  21    Creatinine 0.61 - 1.24 mg/dL  5.85  2.77   Sodium 135 - 145 mmol/L 135  139  137   Potassium 3.5 - 5.1 mmol/L 4.2  4.3  4.2   Chloride 98 - 111 mmol/L 101  103  103   CO2 22 - 32 mmol/L 24  24  26    Calcium 8.9 - 10.3 mg/dL 8.7  9.2  9.1     12. Acute on chronic anemia:     7/3 stable    Latest Ref Rng & Units 08/27/2021    6:25 AM 08/20/2021    6:24 AM 08/13/2021    6:12 AM  CBC  WBC 4.0 - 10.5 K/uL 4.4  5.5  6.3   Hemoglobin 13.0 - 17.0 g/dL 9.4  9.5  9.7   Hematocrit 39.0 - 52.0 % 29.2  29.6  30.1   Platelets 150 - 400 K/uL 255  274  232     13. Urinary urgency with incontinence-    -is now continent      7/9 Voids fine with urinal      LOS: 22 days A FACE TO FACE EVALUATION WAS PERFORMED  Shawn Decker 08/31/2021, 8:25 AM

## 2021-08-31 NOTE — Progress Notes (Signed)
Inpatient Rehabilitation Care Coordinator Discharge Note   Patient Details  Name: Shawn Decker MRN: [id number] Date of Birth: [birth year]   Discharge location: D/c to home with support from son  Length of Stay: 21 days  Discharge activity level: Supervision  Home/community participation: Limited  Patient response OE:HOZYYQ Literacy - How often do you need to have someone help you when you read instructions, pamphlets, or other written material from your doctor or pharmacy?: Never  Patient response MG:NOIBBC Isolation - How often do you feel lonely or isolated from those around you?: Never  Services provided included: MD, RD, PT, OT, RN, CM, TR, Pharmacy, Neuropsych, SW  Financial Services:  Field seismologist Utilized: [employer]  Choices offered to/list presented to: Yes  Follow-up services arranged:  Outpatient, DME    Outpatient Servicies: [facility] location for PT/OT DME : has RW, 3in1, and family purchased shower chair    Patient response to transportation need: Is the patient able to respond to transportation needs?: Yes In the past 12 months, has lack of transportation kept you from medical appointments or from getting medications?: No In the past 12 months, has lack of transportation kept you from meetings, work, or from getting things needed for daily living?: No    Comments (or additional information):  Patient/Family verbalized understanding of follow-up arrangements:  Yes  Individual responsible for coordination of the follow-up plan: contact pt  Confirmed correct DME delivered: Gretchen Short 08/31/2021    Gretchen Short

## 2021-08-31 NOTE — Progress Notes (Signed)
Recreational Therapy Discharge Summary Patient Details  Name: Shawn Decker MRN: [id number] Date of Birth: 1945/02/24 Today's Date: 08/31/2021  Comments on progress toward goals: Pt has made great progress during LOS.  See specific session notes for goals addressed throughout LOS.  TR sessions focused on leisure education, activity analysis identifying potential modifications, dynamic standing balance, UE use and community reintegration.  Pt is discharging home today with son to provide supervision/contact guard assist.  Pt is motivated to regain further independence and return to previously enjoyed activities.  Goals met.   Reasons for discharge: discharge from hospital  Follow-up: Outpatient  Patient/family agrees with progress made and goals achieved: Yes  [name] 08/31/2021, 8:17 AM

## 2021-08-31 NOTE — Progress Notes (Signed)
Inpatient Rehabilitation Discharge Medication Review by a Pharmacist  A complete drug regimen review was completed for this patient to identify any potential clinically significant medication issues.  High Risk Drug Classes Is patient taking? Indication by Medication  Antipsychotic No   Anticoagulant No   Antibiotic No   Opioid Yes OxyIR- acute pain  Antiplatelet No   Hypoglycemics/insulin No   Vasoactive Medication Yes Amiloride, norvasc, normodyne, flomax- HTN, BPH  Chemotherapy No   Other Yes Arava- RA Gabapentin- neuropathic pain Lipitor- HLD Robaxin- muscle spasms     Type of Medication Issue Identified Description of Issue Recommendation(s)  Drug Interaction(s) (clinically significant)     Duplicate Therapy     Allergy     No Medication Administration End Date     Incorrect Dose     Additional Drug Therapy Needed     Significant med changes from prior encounter (inform family/care partners about these prior to discharge).    Other       Clinically significant medication issues were identified that warrant physician communication and completion of prescribed/recommended actions by midnight of the next day:  No   Time spent performing this drug regimen review (minutes):  30   Jemery Stacey BS, PharmD, BCPS Clinical Pharmacist 08/31/2021 9:58 AM  Contact: [phone] after 3 PM  "Be curious, not judgmental..." -Debbora Dus

## 2021-10-15 ENCOUNTER — Encounter: Payer: Medicare HMO | Attending: Registered Nurse | Admitting: Registered Nurse

## 2021-10-15 ENCOUNTER — Encounter: Payer: Self-pay | Admitting: Registered Nurse

## 2021-10-15 VITALS — BP 122/70 | HR 75 | Ht 68.0 in | Wt 180.0 lb

## 2021-10-15 DIAGNOSIS — G959 Disease of spinal cord, unspecified: Secondary | ICD-10-CM

## 2021-10-15 DIAGNOSIS — I10 Essential (primary) hypertension: Secondary | ICD-10-CM

## 2021-10-15 NOTE — Patient Instructions (Addendum)
Call Dr Danielle Dess office  [address]  [address]  [place], [place] [address] [phone]   Call your Primary Care Physician To schedule Hospital Follow Up appointment

## 2021-10-15 NOTE — Progress Notes (Signed)
Subjective:    Patient ID: Shawn Decker, male    DOB: [birth year], 76 y.o.   MRN: [id number]  HPI: Shawn Decker is a 76 y.o. male who is here for HFU appointment for follow up of his Cervical Myelopathy and Essential Hypertension. Shawn Decker was seen in [facility] ED on 08/03/2021 with complaints of 2-3 week history of numbness, weakness and inability to walk or raise his upper extremities.    Dr Ophelia Charter H&P: 08/04/2021  HPI: Shawn Decker is a 76 y.o. male with medical history significant of HTN, HLD, and DM presenting with numbness/weakness/tingling.  He reports having a long h/o peripheral neuropathy.  He also remotely had C2-3 and 3-4 fusions.  About 2 weeks ago, he noticed tingling in his B fingers, symmetric.  He also noticed around the same time worsening tingling/numbness in his B feet.  It has progressed to weakness.  3 weeks ago, he was able to ambulate independently; now he can barely get around with a walker.  He is unable to lift his arms above his chest.  His legs are increasingly weak.  No bowel/bladder incontinence.   ER Course:  Carryover, per Dr. Antionette Char:   76 yr old man with HTN, HLD, and CKD III who presents with 2 wks of numbness/tingling in UEs, progressive weakness in b/l LEs and shoulders, and numbness in saddle area, and is found on MRI to have severe cervical stenosis with cord signal abnormality. Dr. Danielle Dess of neurosurgery will see him in consultation and recommended starting Decadron.  MR Brain: WO Contrast:     IMPRESSION: Normal for age noncontrast MRI appearance of the Brain.  MR Cervical Spine:  IMPRESSION: 1. Previous C3-C4 and C4-C5 ACDF with Severe adjacent segment disease, spinal stenosis, spinal cord mass effect, AND abnormal cord signal at both C2-C3 and C5-C6 adjacent segments. This could reflect a combination of cord edema and myelomalacia. 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 Recommend Neurosurgery consultation.   2.  Associated moderate to severe bilateral C4 and C6 foraminal stenosis. And borderline to mild spinal stenosis at C6-C7 and C7-T1 with up to moderate C7 and C8 foraminal stenosis.   Neurosurgery Consulted. Shawn Decker underwent: on 08/04/2021: Dr Danielle Dess POSTERIOR CERVICAL FUSION/FORAMINOTOMY FIXATION Cervical two -Cervical six  Shawn Decker was admitted to inpatient rehabilitation on 08/09/2021 and discharged home on 08/31/2021. He states he has pain in his neck. He rates his pain 7. He states he walks with a walker in his home. He arrived in wheelchair.   Son in room.    Pain Inventory Average Pain 7 Pain Right Now 7 My pain is sharp  In the last 24 hours, has pain interfered with the following? General activity 2 Relation with others 2 Enjoyment of life 2 What TIME of day is your pain at its worst? night Sleep (in general) Fair  Pain is worse with: unsure Pain improves with: therapy/exercise and medication Relief from Meds: 2  walk with assistance use a walker how many minutes can you walk? 5-10 minutes ability to climb steps?  yes do you drive?  no use a wheelchair Do you have any goals in this area?  yes  not employed: date last employed .  bladder control problems bowel control problems weakness numbness tremor trouble walking  Any changes since last visit?  no  Any changes since last visit?  no    Family History  Problem Relation Age of Onset   Ovarian cancer Sister  Lung cancer Sister    Social History   Socioeconomic History   Marital status: Married    Spouse name: Not on file   Number of children: Not on file   Years of education: Not on file   Highest education level: Not on file  Occupational History   Occupation: retired  Tobacco Use   Smoking status: Former   Smokeless tobacco: Never  Substance and Sexual Activity   Alcohol use: Yes    Comment: 1 drink about 3 times a week   Drug use: No   Sexual activity: Never  Other Topics  Concern   Not on file  Social History Narrative   Not on file   Social Determinants of Health   Financial Resource Strain: Not on file  Food Insecurity: Not on file  Transportation Needs: Not on file  Physical Activity: Not on file  Stress: Not on file  Social Connections: Not on file   Past Surgical History:  Procedure Laterality Date   ANTERIOR CERVICAL DECOMP/DISCECTOMY FUSION     C2-3, 3-4   EYE SURGERY     POSTERIOR CERVICAL FUSION/FORAMINOTOMY N/A 08/04/2021   Procedure: POSTERIOR CERVICAL FUSION/FORAMINOTOMY FIXATION Cervical two -Cervical six;  Surgeon: Kristeen Miss, MD;  Location: [facility];  Service: Neurosurgery;  Laterality: N/A;   Past Medical History:  Diagnosis Date   Cervical stenosis of spine    Diabetes mellitus without complication (HCC)    DNR (do not resuscitate)    Hyperlipidemia    Hypertension    Stage 3a chronic kidney disease (CKD) (HCC)    BP 122/70   Pulse 75   Ht 5\' 8"  (1.727 m)   Wt 180 lb (81.6 kg)   SpO2 96%   BMI 27.37 kg/m   Opioid Risk Score:   Fall Risk Score:  `1  Depression screen [facility] 2/9     10/15/2021   10:40 AM  Depression screen PHQ 2/9  Decreased Interest 0  Down, Depressed, Hopeless 0  PHQ - 2 Score 0      Review of Systems  Musculoskeletal:  Positive for neck pain.  All other systems reviewed and are negative.     Objective:   Physical Exam Vitals and nursing note reviewed.  Constitutional:      Appearance: Normal appearance.  Cardiovascular:     Rate and Rhythm: Normal rate and regular rhythm.     Pulses: Normal pulses.     Heart sounds: Normal heart sounds.  Pulmonary:     Effort: Pulmonary effort is normal.     Breath sounds: Normal breath sounds.  Musculoskeletal:     Cervical back: Normal range of motion and neck supple.     Comments: Normal Muscle Bulk and Muscle Testing Reveals:  Upper Extremities: Full ROM and Muscle Strength 5/5  Lumbar Paraspinal Tenderness: L-4-L-5 Lower Extremities: Right:  Full ROM and Muscle Strength 5/5 Left Lower Extremity: Decreased ROM and Muscle Strength 4/5 Arrived in wheelchair     Skin:    General: Skin is warm and dry.  Neurological:     Mental Status: He is alert and oriented to person, place, and time.  Psychiatric:        Mood and Affect: Mood normal.        Behavior: Behavior normal.         Assessment & Plan:  Cervical Myelopathy: S/P See Below by Dr Ellene Route on 08/04/2021. Wearing Cervical Collar. Neck incision healed. Dr Ellene Route office called to obtain [place] appointment, no  answer. Left message to call Shawn Decker, Mr Decker and son instructed to call Dr Danielle Dess office if they don't received a return call by Wednesday. They verbalize understanding.       Procedure Laterality Anesthesia  POSTERIOR CERVICAL FUSION/FORAMINOTOMY FIXATION Cervical two -Cervical six       2.  Essential Hypertension. Continue current medication regimen. He was instructed to call his PCP to schedule HFU appointment. He erbalizes understanding.   F/U with Dr Berline Chough in 4- 6 weeks

## 2021-12-17 ENCOUNTER — Encounter: Payer: Medicare HMO | Attending: Registered Nurse | Admitting: Physical Medicine and Rehabilitation

## 2021-12-17 ENCOUNTER — Encounter: Payer: Medicare HMO | Admitting: Physical Medicine and Rehabilitation

## 2021-12-17 ENCOUNTER — Encounter: Payer: Self-pay | Admitting: Physical Medicine and Rehabilitation

## 2021-12-17 VITALS — BP 157/74 | HR 77 | Ht 68.0 in | Wt 189.0 lb

## 2021-12-17 DIAGNOSIS — R252 Cramp and spasm: Secondary | ICD-10-CM | POA: Diagnosis present

## 2021-12-17 DIAGNOSIS — N1831 Chronic kidney disease, stage 3a: Secondary | ICD-10-CM | POA: Insufficient documentation

## 2021-12-17 DIAGNOSIS — Z5181 Encounter for therapeutic drug level monitoring: Secondary | ICD-10-CM | POA: Diagnosis present

## 2021-12-17 DIAGNOSIS — G894 Chronic pain syndrome: Secondary | ICD-10-CM | POA: Insufficient documentation

## 2021-12-17 DIAGNOSIS — Z79891 Long term (current) use of opiate analgesic: Secondary | ICD-10-CM | POA: Diagnosis present

## 2021-12-17 DIAGNOSIS — R269 Unspecified abnormalities of gait and mobility: Secondary | ICD-10-CM | POA: Diagnosis present

## 2021-12-17 DIAGNOSIS — G959 Disease of spinal cord, unspecified: Secondary | ICD-10-CM | POA: Diagnosis present

## 2021-12-17 NOTE — Assessment & Plan Note (Addendum)
Continue OP f/u with Dr. Ellene Route.  You will ask your kidney doctor's office to send [place] results of your recent blood work. Once these are obtained, we can discuss increasing your gabapentin at nighttime and/or increasing the frequency of the medication to three times daily for shooting pains.   You will follow up with Dr. Dagoberto Ligas in 2-3 months.

## 2021-12-17 NOTE — Assessment & Plan Note (Signed)
At nighttime, when laying down only, in bilateral hips/buttocks.  Discussed starting Tizaninidine for nighttime leg spasms, as baclofen and robaxin has not been helpful in the past. Will hold off until bloodwork results obtained as above.

## 2021-12-17 NOTE — Progress Notes (Unsigned)
Subjective:    Patient ID: Shawn Decker, male    DOB: 29-Jul-1945, 76 y.o.   MRN: [id number]  Shawn Decker is a 76 y.o. male with history of T2DM, HTN, CKD, RA, cervical decompression  who presents as a follow up from IPR 6/22-7/14 under the care of Dr. Berline Chough s/p cervical decompression C2/3 and C5/6 with Dr. Danielle Dess, with post-op persistent myelopathy with bilateral upper extremity and bilateral lower extremity weakness, ataxia, and difficulty clearing secretions. IPR course was c/b hypertension, hyponatremia, neurogenic bladder, constipation, and elevated blood sugars. At DC he was able to complete ADL tasks with CGA to supervision; supervision with cues for tranfers and to ambulate 200' with RW.   He presents to clinic today with his son for follow up after initial OP follow up with Shawn Lefevre, NP 8/28.   Interval Hx:  - Dr. Danielle Dess saw him, and wants him to follow up again next month. Stated no concerns, no changes.   - Patient has finished OP therapies at this point, states he wasn't sure if this was correct. He states he went for over a month. He was doing TUG testing and states he may have hit his goal, but he is not back to the point that he wants to be at from a gait stability standpoint.   - He states his goals are to go to the grocery store, going out to eat. He is frustrated and states he is both too fatigued to do hose things, and frustrated that he continues to need to use the walker. He states after his first decompression, he regained full function and he feels he is not getting back to baseline after this one.   - He states since the surgery, he has had urinary urgency and intermittent incontinence. This has been improved since his surgery but overall remains bothersome. If he does timed toiletting, he can keep himself from having accidents. He also feels he benefits from the flomax despite the urgency, and can tell that he can't empty without it. No bowel incontinence, moving daily.    - He states he has burning pain in his buttocks, which is especially bothersome at bedtime. When he lays down, "I can't stand it". Oxycodone helps relieve it but only temporarily; he has a few left. If he's not laying down, he does not hurt. He also endorses spasms in his legs at nighttime. No longer taking gabapentin d/t not appreciating a difference on current dose; has never tried increasing. No difference with robaxin, baclofen, or lower dose oxycodone either.   HPI Pain Inventory Average Pain 5 Pain Right Now 3 My pain is tingling  In the last 24 hours, has pain interfered with the following? General activity 3 Relation with others 3 Enjoyment of life 3 What TIME of day is your pain at its worst? night Sleep (in general) NA  Pain is worse with: some activites Pain improves with: medication Relief from Meds: 9  Family History  Problem Relation Age of Onset   Ovarian cancer Sister    Lung cancer Sister    Social History   Socioeconomic History   Marital status: Widowed    Spouse name: Not on file   Number of children: Not on file   Years of education: Not on file   Highest education level: Not on file  Occupational History   Occupation: retired  Tobacco Use   Smoking status: Former   Smokeless tobacco: Never  Substance and Sexual Activity   Alcohol  use: Yes    Comment: 1 drink about 3 times a week   Drug use: No   Sexual activity: Never  Other Topics Concern   Not on file  Social History Narrative   Not on file   Social Determinants of Health   Financial Resource Strain: Not on file  Food Insecurity: Not on file  Transportation Needs: Not on file  Physical Activity: Not on file  Stress: Not on file  Social Connections: Not on file   Past Surgical History:  Procedure Laterality Date   ANTERIOR CERVICAL DECOMP/DISCECTOMY FUSION     C2-3, 3-4   EYE SURGERY     POSTERIOR CERVICAL FUSION/FORAMINOTOMY N/A 08/04/2021   Procedure: POSTERIOR CERVICAL  FUSION/FORAMINOTOMY FIXATION Cervical two -Cervical six;  Surgeon: Barnett Abu, MD;  Location: [facility] OR;  Service: Neurosurgery;  Laterality: N/A;   Past Surgical History:  Procedure Laterality Date   ANTERIOR CERVICAL DECOMP/DISCECTOMY FUSION     C2-3, 3-4   EYE SURGERY     POSTERIOR CERVICAL FUSION/FORAMINOTOMY N/A 08/04/2021   Procedure: POSTERIOR CERVICAL FUSION/FORAMINOTOMY FIXATION Cervical two -Cervical six;  Surgeon: Barnett Abu, MD;  Location: [facility] OR;  Service: Neurosurgery;  Laterality: N/A;   Past Medical History:  Diagnosis Date   Cervical stenosis of spine    Diabetes mellitus without complication (HCC)    DNR (do not resuscitate)    Hyperlipidemia    Hypertension    Stage 3a chronic kidney disease (CKD) (HCC)    BP (!) 157/74   Pulse 77   Ht 5\' 8"  (1.727 m)   Wt 189 lb (85.7 kg)   SpO2 94%   BMI 28.74 kg/m   Opioid Risk Score:   Fall Risk Score:  `1  Depression screen [facility] 2/9     12/17/2021    2:11 PM 10/15/2021   10:50 AM 10/15/2021   10:40 AM  Depression screen PHQ 2/9  Decreased Interest 0 0 0  Down, Depressed, Hopeless 0 0 0  PHQ - 2 Score 0 0 0  Altered sleeping  0   Tired, decreased energy  0   Change in appetite  0   Feeling bad or failure about yourself   0   Trouble concentrating  0   Moving slowly or fidgety/restless  0   Suicidal thoughts  0   PHQ-9 Score  0   Difficult doing work/chores  Not difficult at all       Review of Systems  Constitutional: Negative.   HENT: Negative.    Eyes: Negative.   Respiratory: Negative.    Cardiovascular: Negative.   Gastrointestinal: Negative.   Endocrine: Negative.   Genitourinary: Negative.   Musculoskeletal:  Positive for gait problem.  Skin: Negative.   Allergic/Immunologic: Negative.   Hematological: Negative.   Psychiatric/Behavioral: Negative.    All other systems reviewed and are negative.      Objective:   Physical Exam  Constitution: Appropriate appearance for age. No apparent  distress. HEENT: PERRL, EOMI grossly intact.  Resp: CTAB. No rales, rhonchi, or wheezing. Cardio: RRR. No mumurs, rubs, or gallops. No peripheral edema. Abdomen: Nondistended. Nontender. +bowel sounds. Psych: Mildly anxious mood and appropriate affect. Neuro: AAOx4. No apparent deficits   Neurologic Exam:   DTRs: No clonus on bilateral ankle jerk. Babinsky: flexor responses b/l.   Hoffmans: negative b/l Sensory exam: revealed normal sensation in all dermatomal regions in bilateral  lower or upper extremities. . Motor exam: strength normal in all myotomal regions of bilateral upper extremities;  5-/5 bilateral hip flexion but otherwise 5/5 bilateral LEs. Coordination: Fine motor coordination was normal in FTN; HTS mildly difficult. Balance extremely difficult.  Spasticity: MAS 0 throughout Gait: Ataxic gait with decreased stride length, increased time for steps bilaterally. No knee buckling or foot drop appreciated. Uses RW for stability.   MSK: Mild TTP bilateral paraspinals, PSIS, ischial tuberosities. Patient points area of greatest pain extending laterally from bilateral low back and around hips; unable to reproduce on palpation. No tenderpoints palpated.     Assessment & Plan:  Shawn Decker is a 76 y.o. male with history of T2DM, HTN, CKD, RA, cervical decompression  who presents as a follow up from IPR 6/22-7/14 under the care of Dr. Dagoberto Ligas s/p cervical decompression C2/3 and C5/6 with Dr. Ellene Route, with post-op persistent myelopathy with bilateral upper extremity and bilateral lower extremity weakness, ataxia, and difficulty clearing secretions. IPR course was c/b hypertension, hyponatremia, neurogenic bladder, constipation. He presents to clinic today with his son for follow up, with specific concerns of buttocks pain and nighttime spasticity, along with ongoing gait ataxia.   Chronic pain syndrome Assessment & Plan: Indication for chronic opioid: cervical myelopathy, chronic  pain Medication and dose: Oxycodone-acetaminophen 10-325 mg TID PRN (only using QHS PRN) # pills per month: 90 (30 moving forward) Last UDS date: Attempted 12/17/21; patient initially agreeable, then clinic nurse reports he refused; uncertain why.  Opioid Treatment Agreement signed (Y/N): Y Opioid Treatment Agreement last reviewed with patient:   NCCSRS/PDMP reviewed this encounter (include red flags): Yes   Patient will require UDS for further medication refills.    Cervical myelopathy ([place]) Assessment & Plan: Continue OP f/u with Dr. Ellene Route.  You will ask your kidney doctor's office to send [place] results of your recent blood work. Once these are obtained, we can discuss increasing your gabapentin at nighttime and/or increasing the frequency of the medication to three times daily for shooting pains.   You will follow up with Dr. Dagoberto Ligas in 2-3 months.   Orders: -     Ambulatory referral to Physical Therapy  Stage 3a chronic kidney disease (CKD) ([place]) Assessment & Plan: Records request from renal doctor for drug dosing as above   Spasticity Assessment & Plan: At nighttime, when laying down only, in bilateral hips/buttocks.  Discussed starting Tizaninidine for nighttime leg spasms, as baclofen and robaxin has not been helpful in the past. Will hold off until bloodwork results obtained as above.   Orders: -     Ambulatory referral to Physical Therapy  Gait difficulty Assessment & Plan: Remains with ataxic gait, frustrated regarding discontinuation of PT which he felt he was benefiting from. His goal is to ambulate without a walker.   Renewed referral to PT at [facility] in [place]; advised if not approved, we may re-attempt after New year.   Did discuss with patient that he may continue to have residual deficits from spinal cord injury moving forward, despite complete recovery from prior surgery. No new concerning symptoms to warrant repeat imaging at this time.   Orders: -      Ambulatory referral to Physical Therapy  Encounter for therapeutic drug monitoring  Encounter for long-term opiate analgesic use     Gertie Gowda, DO 12/18/2021

## 2021-12-17 NOTE — Assessment & Plan Note (Signed)
Remains with ataxic gait, frustrated regarding discontinuation of PT which he felt he was benefiting from. His goal is to ambulate without a walker.   Renewed referral to PT at [facility] in [place]; advised if not approved, we may re-attempt after New year.   Did discuss with patient that he may continue to have residual deficits from spinal cord injury moving forward, despite complete recovery from prior surgery. No new concerning symptoms to warrant repeat imaging at this time.

## 2021-12-17 NOTE — Assessment & Plan Note (Signed)
Records request from renal doctor for drug dosing as above

## 2021-12-17 NOTE — Patient Instructions (Addendum)
Today, you signed a pain contract and received a drug screen. The drug screen will result in 5-7 days; if results are as expected, you will receive 1 month of Oxycodone-Acetaminophen 10-325 mg tablets QHS PRN for pain, with 2 refills.   I have sent a script for PT to [facility] in [place]. If insurance does not approve these sessions, we can re-attempt at the beginning of next year.    You will ask your kidney doctor's office to send [place] results of your recent blood work. Once these are obtained, we can discuss increasing your gabapentin at nighttime and/or increasing the frequency of the medication to three times daily for shooting pains. We can also discuss starting Tizaninidine for nighttime leg spasms, as baclofen and robaxin has not been helpful in the past.  You will follow up with Dr. Dagoberto Ligas in 2-3 months.

## 2021-12-18 NOTE — Assessment & Plan Note (Addendum)
Indication for chronic opioid: cervical myelopathy, chronic pain Medication and dose: Oxycodone-acetaminophen 10-325 mg TID PRN (only using QHS PRN) # pills per month: 90 (30 moving forward) Last UDS date: Attempted 12/17/21; patient initially agreeable, then clinic nurse reports he refused; uncertain why.  Opioid Treatment Agreement signed (Y/N): Y Opioid Treatment Agreement last reviewed with patient:   NCCSRS/PDMP reviewed this encounter (include red flags): Yes   Patient will require UDS for further medication refills.

## 2022-03-22 ENCOUNTER — Encounter: Payer: Medicare HMO | Attending: Physical Medicine and Rehabilitation | Admitting: Physical Medicine and Rehabilitation

## 2022-03-22 ENCOUNTER — Encounter: Payer: Self-pay | Admitting: Physical Medicine and Rehabilitation

## 2022-03-22 VITALS — BP 124/72 | HR 72 | Ht 68.0 in | Wt 190.0 lb

## 2022-03-22 DIAGNOSIS — R269 Unspecified abnormalities of gait and mobility: Secondary | ICD-10-CM | POA: Insufficient documentation

## 2022-03-22 DIAGNOSIS — R252 Cramp and spasm: Secondary | ICD-10-CM | POA: Insufficient documentation

## 2022-03-22 DIAGNOSIS — G959 Disease of spinal cord, unspecified: Secondary | ICD-10-CM | POA: Diagnosis present

## 2022-03-22 MED ORDER — BACLOFEN 10 MG PO TABS: 5.0000 mg | ORAL_TABLET | Freq: Two times a day (BID) | ORAL | 5 refills | Status: DC | PRN

## 2022-03-22 NOTE — Patient Instructions (Signed)
Shawn Decker is a 77 y.o. male with history of T2DM, HTN, CKD, RA, cervical decompression  who presents as a follow up from IPR 6/22-7/14 under the care of Dr. Dagoberto Ligas s/p cervical decompression C2/3 and C5/6 with Dr. Ellene Route, with post-op persistent myelopathy with bilateral upper extremity and bilateral lower extremity weakness, ataxia, and difficulty clearing secretions - admitted to [facility] 6/23 Here for f/u on cervical myelopathy. Mild spasticity- is having spasms.   Not taking Oxycodone, Baclofen, Gabapentin or Robaxin anymore- been off at least 2 months.   2.  Went over marches, knee bends, and heels and toes- and push 10 ft further every few days.    3. Do a little bit more than had can make gains up to 1 year after surgery.   4.  C/o some muscle spasms- since usually occurs when in bed- so will write for Baclofen 5-10 mg nightly AND can take another 10 mg during the day if necessary.   5. F/U in 6 months- to see how spasticity - if muscle spasms get worse for more than 3 days- call me before appointment

## 2022-03-22 NOTE — Progress Notes (Signed)
Subjective:    Patient ID: Shawn Decker, male    DOB: 17-Jun-1945, 77 y.o.   MRN: [id number]  HPI  Shawn Decker is a 77 y.o. male with history of T2DM, HTN, CKD, RA, cervical decompression  who presents as a follow up from [facility] 6/22-7/14 under the care of Dr. Dagoberto Ligas s/p cervical decompression C2/3 and C5/6 with Dr. Ellene Route, with post-op persistent myelopathy with bilateral upper extremity and bilateral lower extremity weakness, ataxia, and difficulty clearing secretions - admitted to [facility] 6/23 Here for f/u on cervical myelopathy  Have had 2 cervical surgeries in total- Dr Ellene Route did last one- prior one [place]. 2017.   Done with therapy since late last year.  Not as strong as would like to be.  Still using RW.  When had first procedure on neck, was off RW, but not this time.   Can walk ~ 100 ft before needs to take a break with RW.     Hasn't gone out to eat or gone to  grocery store- basically only gets out to doctor's appointment   Fatigue sets in even when at home.   Bowel/bladder- still has to rush-" gotta go, gotta go"- and has to rush to bathroom- no more bladder accidents.   Pain pretty good- off Baclofen, Oxy and Robaxin and Gabapentin-  Finally is back to a good level.   When lays down at night, twists and turns to get comfortable, Takes awhile to get comfortable-   Last Saw Dr Ellene Route last week- had another MRI- not in omputer, but didn't see any changes since June 2023.   Tendency to drag L leg when walking- doesn't have to get tired to do so.   Has gone to therapy 2 separate times- last one end of December 2023- 2x/week for whole month of December.  Didn't notice any improvement when did that.    Pain Inventory Average Pain 3 Pain Right Now 0 My pain is sharp and tingling  In the last 24 hours, has pain interfered with the following? General activity 3 Relation with others 2 Enjoyment of life 2 What TIME of day is your pain at its worst? night Sleep (in  general) Poor  Pain is worse with: unsure Pain improves with: medication Relief from Meds: 5  Family History  Problem Relation Age of Onset   Ovarian cancer Sister    Lung cancer Sister    Social History   Socioeconomic History   Marital status: Widowed    Spouse name: Not on file   Number of children: Not on file   Years of education: Not on file   Highest education level: Not on file  Occupational History   Occupation: retired  Tobacco Use   Smoking status: Former   Smokeless tobacco: Never  Substance and Sexual Activity   Alcohol use: Yes    Comment: 1 drink about 3 times a week   Drug use: No   Sexual activity: Never  Other Topics Concern   Not on file  Social History Narrative   Not on file   Social Determinants of Health   Financial Resource Strain: Not on file  Food Insecurity: Not on file  Transportation Needs: Not on file  Physical Activity: Not on file  Stress: Not on file  Social Connections: Not on file   Past Surgical History:  Procedure Laterality Date   ANTERIOR CERVICAL DECOMP/DISCECTOMY FUSION     C2-3, 3-4   EYE SURGERY  POSTERIOR CERVICAL FUSION/FORAMINOTOMY N/A 08/04/2021   Procedure: POSTERIOR CERVICAL FUSION/FORAMINOTOMY FIXATION Cervical two -Cervical six;  Surgeon: Kristeen Miss, MD;  Location: [place];  Service: Neurosurgery;  Laterality: N/A;   Past Surgical History:  Procedure Laterality Date   ANTERIOR CERVICAL DECOMP/DISCECTOMY FUSION     C2-3, 3-4   EYE SURGERY     POSTERIOR CERVICAL FUSION/FORAMINOTOMY N/A 08/04/2021   Procedure: POSTERIOR CERVICAL FUSION/FORAMINOTOMY FIXATION Cervical two -Cervical six;  Surgeon: Kristeen Miss, MD;  Location: [place];  Service: Neurosurgery;  Laterality: N/A;   Past Medical History:  Diagnosis Date   Cervical stenosis of spine    Diabetes mellitus without complication (HCC)    DNR (do not resuscitate)    Hyperlipidemia    Hypertension    Stage 3a chronic kidney disease (CKD) (HCC)    BP  124/72   Pulse 72   Ht 5\' 8"  (1.727 m)   Wt 190 lb (86.2 kg)   SpO2 96%   BMI 28.89 kg/m   Opioid Risk Score:   Fall Risk Score:  `1  Depression screen [facility] 2/9     12/17/2021    2:11 PM 10/15/2021   10:50 AM 10/15/2021   10:40 AM  Depression screen PHQ 2/9  Decreased Interest 0 0 0  Down, Depressed, Hopeless 0 0 0  PHQ - 2 Score 0 0 0  Altered sleeping  0   Tired, decreased energy  0   Change in appetite  0   Feeling bad or failure about yourself   0   Trouble concentrating  0   Moving slowly or fidgety/restless  0   Suicidal thoughts  0   PHQ-9 Score  0   Difficult doing work/chores  Not difficult at all       Review of Systems  Musculoskeletal:  Positive for back pain.  All other systems reviewed and are negative.     Objective:   Physical Exam  More of issue balance and endurance than strength Per pt  Awake, alert, appropriate, accompanied by son, NAD Using RW to get around MS; deltoids 4/5; biceps 4+/5 to 5-/5; triceps 5-/5 WE 5-/5 grip 5/5 and FA 5/5 on L and 5-/5 on R LE's RLE- HF 4/5; KE, DF and PF 5-/5 LLE- HF 5-/5; KE 5/5 and DF and PF 5-/5  Neuro: Hoffman's B/L UE"s RLE 2-3 beats clonus none in LLE.   Gait:  Swaying slightly when walks-  R hip very slightly weak- HF noticeably harder lifting R leg slightly.   Healed scar from incision in anterior neck.       Assessment & Plan:   Shawn Decker is a 77 y.o. male with history of T2DM, HTN, CKD, RA, cervical decompression  who presents as a follow up from [facility] 6[facility]/14 under the care of Dr. Dagoberto Ligas s/p cervical decompression C2/3 and C5/6 with Dr. Ellene Route, with post-op persistent myelopathy with bilateral upper extremity and bilateral lower extremity weakness, ataxia, and difficulty clearing secretions - admitted to [facility] 6/23 Here for f/u on cervical myelopathy. Mild spasticity- is having spasms.   Not taking Oxycodone, Baclofen, Gabapentin or Robaxin anymore- been off at least 2 months.   2.  Went  over marches, knee bends, and heels and toes- and push 10 ft further every few days.    3. Do a little bit more than had can make gains up to 1 year after surgery.   4.  C/o some muscle spasms- since usually occurs when in bed- so will  write for Baclofen 5-10 mg nightly AND can take another 10 mg during the day if necessary.   5. F/U in 6 months- to see how spasticity - if muscle spasms get worse for more than 3 days- call me before appointment   I spent a total of  35  minutes on total care today- >50% coordination of care- due to education on spasticity, options for that and how to make gains- also reviewed MRI of old and newest one.

## 2022-09-21 ENCOUNTER — Other Ambulatory Visit: Payer: Self-pay | Admitting: Physical Medicine and Rehabilitation

## 2022-10-02 ENCOUNTER — Encounter: Payer: Medicare HMO | Admitting: Physical Medicine and Rehabilitation

## 2023-03-26 ENCOUNTER — Other Ambulatory Visit: Payer: Self-pay | Admitting: Physical Medicine and Rehabilitation
